# Patient Record
Sex: Female | Born: 1990 | Race: Black or African American | Hispanic: No | Marital: Married | Smoking: Never smoker
Health system: Southern US, Community
[De-identification: ages and names within clinical notes are randomized; demographics above are authoritative.]

## PROBLEM LIST (undated history)

## (undated) ENCOUNTER — Inpatient Hospital Stay (HOSPITAL_COMMUNITY): Payer: Self-pay

## (undated) DIAGNOSIS — R519 Headache, unspecified: Secondary | ICD-10-CM

## (undated) DIAGNOSIS — O009 Unspecified ectopic pregnancy without intrauterine pregnancy: Secondary | ICD-10-CM

## (undated) DIAGNOSIS — Z8619 Personal history of other infectious and parasitic diseases: Secondary | ICD-10-CM

## (undated) DIAGNOSIS — R51 Headache: Secondary | ICD-10-CM

## (undated) HISTORY — PX: DILATION AND CURETTAGE OF UTERUS: SHX78

## (undated) HISTORY — DX: Personal history of other infectious and parasitic diseases: Z86.19

## (undated) HISTORY — PX: TONSILLECTOMY: SUR1361

## (undated) HISTORY — PX: FOOT SURGERY: SHX648

---
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
## 2008-09-01 ENCOUNTER — Emergency Department (HOSPITAL_COMMUNITY): Admission: EM | Admit: 2008-09-01 | Discharge: 2008-09-02 | Payer: Self-pay | Admitting: Emergency Medicine

## 2009-07-01 ENCOUNTER — Encounter: Admission: RE | Admit: 2009-07-01 | Discharge: 2009-07-01 | Payer: Self-pay | Admitting: Family Medicine

## 2010-10-01 ENCOUNTER — Encounter: Payer: Self-pay | Admitting: Family Medicine

## 2011-01-23 NOTE — Op Note (Signed)
NAMETOI, STELLY               ACCOUNT NO.:  [id number]   MEDICAL RECORD NO.:  [id number]          PATIENT TYPE:  AMB   LOCATION:  DSC                          FACILITY:  [facility]   PHYSICIAN:  Christopher E. Ezzard Standing, M.D.DATE OF BIRTH:  [birth year]   DATE OF PROCEDURE:  03/04/2007  DATE OF DISCHARGE:                               OPERATIVE REPORT   PREOPERATIVE DIAGNOSIS:  Recurrent tonsillitis with tonsillar  hypertrophy.   POSTOPERATIVE DIAGNOSIS:  Recurrent tonsillitis with tonsillar  hypertrophy.   OPERATION:  Tonsillectomy.   SURGEON:  Kristine Garbe. Ezzard Standing, M.D.   ANESTHESIA:  General endotracheal.   COMPLICATIONS:  None.   BRIEF CLINICAL NOTE:  Rebecca Benjamin is a 20 year old female who has had  several episodes of Strep during the past years.  On examination, she  has large 3+ tonsils bilaterally with some white debris within the  tonsil crypts.  Because of her history of recurrent Strep and tonsillar  hypertrophy, she is taken to the operating room at this time for  tonsillectomy.   DESCRIPTION OF PROCEDURE:  After adequate endotracheal anesthesia, Rebecca Benjamin  received 10 mg Decadron IV preoperatively as well as 1 gram Ancef IV  preoperatively.  A mouth gag was used to expose the oropharynx.  The  left and right tonsils were then resected from the tonsillar fossa using  cautery.  Care was taken to preserve the anterior present tonsillar  pillars as well as the uvula.  Hemostasis was obtained with cautery.  After completion of removal of the tonsils, the oropharynx was irrigated  with saline.  The nasopharynx was then examined with a mirror.  Rebecca Benjamin  had minimal adenoid tissue.  Nothing was done in this region.  This  completed the procedure.  Rebecca Benjamin was awoken from anesthesia and  transferred to the recovery room postop doing well.   DISPOSITION:  Rebecca Benjamin will be discharged home later this morning on  amoxicillin suspension 500 mg b.i.d. for 1 week, Tylenol and Lortab  elixir 1 to 1-1/2 tablespoons every 4 hours p.r.n. pain.  She will  follow up in my office in 10 to 14 days for recheck.           ______________________________  Kristine Garbe. Ezzard Standing, M.D.     CEN/MEDQ  D:  03/04/2007  T:  03/04/2007  Job:  [id number]   cc:   Renaye Rakers, M.D.

## 2011-06-15 LAB — URINE MICROSCOPIC-ADD ON

## 2011-06-15 LAB — COMPREHENSIVE METABOLIC PANEL
ALT: 19 U/L (ref 0–35)
AST: 23 U/L (ref 0–37)
Albumin: 4.8 g/dL (ref 3.5–5.2)
Alkaline Phosphatase: 62 U/L (ref 39–117)
BUN: 16 mg/dL (ref 6–23)
CO2: 24 mEq/L (ref 19–32)
Calcium: 10 mg/dL (ref 8.4–10.5)
Chloride: 104 mEq/L (ref 96–112)
Creatinine, Ser: 0.77 mg/dL (ref 0.4–1.2)
GFR calc Af Amer: >60 mL/min (ref >60)
GFR calc non Af Amer: >60 mL/min (ref >60)
Glucose, Bld: 108 mg/dL — ABNORMAL HIGH (ref 70–99)
Potassium: 3.4 mEq/L — ABNORMAL LOW (ref 3.5–5.1)
Sodium: 140 mEq/L (ref 135–145)
Total Bilirubin: 1.3 mg/dL — ABNORMAL HIGH (ref 0.3–1.2)
Total Protein: 8.2 g/dL (ref 6.0–8.3)

## 2011-06-15 LAB — DIFFERENTIAL
Basophils Absolute: 0 10*3/uL (ref 0.0–0.1)
Basophils Relative: 0 % (ref 0–1)
Neutro Abs: 10.6 10*3/uL — ABNORMAL HIGH (ref 1.7–7.7)
Neutrophils Relative %: 96 % — ABNORMAL HIGH (ref 43–77)

## 2011-06-15 LAB — URINALYSIS, ROUTINE W REFLEX MICROSCOPIC
Leukocytes, UA: NEGATIVE
Protein, ur: 30 mg/dL — AB
Urobilinogen, UA: 0.2 mg/dL (ref 0.0–1.0)

## 2011-06-15 LAB — CBC
HCT: 43.7 % (ref 36.0–46.0)
Hemoglobin: 14.8 g/dL (ref 12.0–15.0)
MCHC: 33.9 g/dL (ref 30.0–36.0)
MCV: 95.9 fL (ref 78.0–100.0)
Platelets: 230 10*3/uL (ref 150–400)
RBC: 4.55 MIL/uL (ref 3.87–5.11)
RDW: 12.5 % (ref 11.5–15.5)
WBC: 11 10*3/uL — ABNORMAL HIGH (ref 4.0–10.5)

## 2011-06-15 LAB — POCT PREGNANCY, URINE: Preg Test, Ur: NEGATIVE

## 2011-11-06 ENCOUNTER — Other Ambulatory Visit: Payer: Self-pay

## 2011-11-06 ENCOUNTER — Emergency Department (HOSPITAL_COMMUNITY)
Admission: EM | Admit: 2011-11-06 | Discharge: 2011-11-07 | Disposition: A | Discharge status: Home / Self Care | Payer: 59 | Attending: Emergency Medicine | Admitting: Emergency Medicine

## 2011-11-06 ENCOUNTER — Encounter (HOSPITAL_COMMUNITY): Payer: Self-pay | Admitting: Emergency Medicine

## 2011-11-06 DIAGNOSIS — R1032 Left lower quadrant pain: Secondary | ICD-10-CM | POA: Insufficient documentation

## 2011-11-06 DIAGNOSIS — Z7982 Long term (current) use of aspirin: Secondary | ICD-10-CM | POA: Insufficient documentation

## 2011-11-06 DIAGNOSIS — R1031 Right lower quadrant pain: Secondary | ICD-10-CM | POA: Insufficient documentation

## 2011-11-06 DIAGNOSIS — R079 Chest pain, unspecified: Secondary | ICD-10-CM | POA: Insufficient documentation

## 2011-11-06 DIAGNOSIS — R070 Pain in throat: Secondary | ICD-10-CM | POA: Insufficient documentation

## 2011-11-06 LAB — CBC
HCT: 35.9 % — ABNORMAL LOW (ref 36.0–46.0)
Hemoglobin: 12.1 g/dL (ref 12.0–15.0)
MCH: 29.8 pg (ref 26.0–34.0)
MCHC: 33.7 g/dL (ref 30.0–36.0)
MCV: 88.4 fL (ref 78.0–100.0)
Platelets: 256 K/uL (ref 150–400)
RBC: 4.06 MIL/uL (ref 3.87–5.11)
RDW: 13.3 % (ref 11.5–15.5)
WBC: 6.6 K/uL (ref 4.0–10.5)

## 2011-11-06 LAB — BASIC METABOLIC PANEL
CO2: 25 mEq/L (ref 19–32)
Chloride: 105 mEq/L (ref 96–112)
Creatinine, Ser: 0.57 mg/dL (ref 0.50–1.10)

## 2011-11-06 NOTE — ED Notes (Signed)
Dr.Opitz to eval ecg at 23:20

## 2011-11-06 NOTE — ED Notes (Signed)
Pt c/o left chest pain radiating into mid chest onset approx 3pm today.   St's chest feels heavy.  Denies shortness of breath, nausea or diaphoresis

## 2011-11-07 ENCOUNTER — Emergency Department (HOSPITAL_COMMUNITY): Payer: 59

## 2011-11-07 LAB — HEPATIC FUNCTION PANEL
ALT: 7 U/L (ref 0–35)
AST: 14 U/L (ref 0–37)
Alkaline Phosphatase: 47 U/L (ref 39–117)
Total Protein: 7.4 g/dL (ref 6.0–8.3)

## 2011-11-07 MED ORDER — GI COCKTAIL ~~LOC~~
30.0000 mL | Freq: Once | ORAL | Status: AC
  Administered 2011-11-07: 30 mL via ORAL
  Filled 2011-11-07: qty 30

## 2011-11-07 MED ORDER — FAMOTIDINE 20 MG PO TABS: 20.0000 mg | ORAL_TABLET | Freq: Two times a day (BID) | ORAL | Status: DC

## 2011-11-07 NOTE — ED Provider Notes (Signed)
History     CSN: [id number]  Arrival date & time 11/06/11  2158   First MD Initiated Contact with Patient 11/07/11 0144      Chief Complaint  Patient presents with  . Chest Pain    (Consider location/radiation/quality/duration/timing/severity/associated sxs/prior treatment) HPI Comments: About noon today.  The patient developed left lower rib pain, radiating to the mid sternal area to her throat now with radiating back down at the sternal area to under the right rib.  She also has posterior neck pain.  No nausea, vomiting, diarrhea, or diaphoresis  The history is provided by the patient.    History reviewed. No pertinent past medical history.  Past Surgical History  Procedure Date  . Tonsillectomy     No family history on file.  History  Substance Use Topics  . Smoking status: Never Smoker   . Smokeless tobacco: Not on file  . Alcohol Use: Yes    OB History    Grav Para Term Preterm Abortions TAB SAB Ect Mult Living                  Review of Systems  Constitutional: Negative for fever and chills.  HENT: Positive for sore throat. Negative for ear pain and trouble swallowing.   Cardiovascular: Positive for chest pain. Negative for palpitations and leg swelling.  Gastrointestinal: Negative for nausea and vomiting.  Genitourinary: Negative for dysuria.  Neurological: Negative for dizziness and weakness.    Allergies  Review of patient's allergies indicates no known allergies.  Home Medications   Current Outpatient Rx  Name Route Sig Dispense Refill  . ASPIRIN EC 325 MG PO TBEC Oral Take 325 mg by mouth once.    Marland Kitchen FAMOTIDINE 20 MG PO TABS Oral Take 1 tablet (20 mg total) by mouth 2 (two) times daily. 30 tablet 0    BP 116/72  Pulse 79  Temp(Src) 98.5 F (36.9 C) (Oral)  Resp 16  SpO2 97%  LMP 10/18/2011  Physical Exam  Constitutional: She is oriented to person, place, and time. She appears well-developed and well-nourished.  Neck: Normal range of  motion.  Pulmonary/Chest: She has no wheezes. She exhibits no tenderness.  Abdominal: Soft. Bowel sounds are normal.    Musculoskeletal: Normal range of motion.  Neurological: She is alert and oriented to person, place, and time.  Skin: Skin is warm and dry.  Psychiatric: She has a normal mood and affect.    ED Course  Procedures (including critical care time)  Labs Reviewed  CBC - Abnormal; Notable for the following:    HCT 35.9 (*)    All other components within normal limits  HEPATIC FUNCTION PANEL - Abnormal; Notable for the following:    Total Bilirubin 0.2 (*)    All other components within normal limits  BASIC METABOLIC PANEL  LIPASE, BLOOD   Dg Chest 2 View  11/07/2011  *RADIOLOGY REPORT*  Clinical Data: Mid chest pain and bilateral lower chest pain.  CHEST - 2 VIEW  Comparison: None.  Findings: The lungs are well-aerated and clear.  There is no evidence of focal opacification, pleural effusion or pneumothorax.  The heart is normal in size; the mediastinal contour is within normal limits.  No acute osseous abnormalities are seen.  IMPRESSION: No acute cardiopulmonary process seen.  Original Report Authenticated By: Tonia Ghent, M.D.     1. Abdominal wall pain in both lower quadrants       MDM  Bilateral upper quadrant abdominal pain.  Mother has history of gallbladder disease with a cholecystectomy.  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  She did take an aspirin prior to arrival in the emergency Discomfort, significantly reduced after GI cocktail was sent home with short course of a PPI and followup with GI as needed       Arman Filter, NP 11/07/11 0339  Arman Filter, NP 11/07/11 [phone]  Medical screening examination/treatment/procedure(s) were performed by non-physician practitioner and as supervising physician I was immediately available for consultation/collaboration.   Sunnie Nielsen, MD 11/08/11 [phone]

## 2011-11-07 NOTE — Discharge Instructions (Signed)
Today her labs are normal.  Your hepatic function is normal.  Your pain was relieved with a GI cocktail is given you a prescription for short course of Pepcid take it as directed twice a day if your pain returns after using the Pepcid and use over-the-counter once daily.  I would recommend following up with lower GI for further diagnosis

## 2012-02-20 ENCOUNTER — Ambulatory Visit (INDEPENDENT_AMBULATORY_CARE_PROVIDER_SITE_OTHER): Payer: 59 | Admitting: Family Medicine

## 2012-02-20 VITALS — BP 103/68 | HR 88 | Temp 98.4°F | Resp 16 | Ht 66.0 in | Wt 127.0 lb

## 2012-02-20 DIAGNOSIS — B009 Herpesviral infection, unspecified: Secondary | ICD-10-CM

## 2012-02-20 MED ORDER — VALACYCLOVIR HCL 500 MG PO TABS: 500.0000 mg | ORAL_TABLET | Freq: Two times a day (BID) | ORAL | Status: AC

## 2012-02-20 MED ORDER — PREDNISONE 20 MG PO TABS: 40.0000 mg | ORAL_TABLET | Freq: Every day | ORAL | Status: AC

## 2012-02-20 MED ORDER — ACYCLOVIR 400 MG PO TABS: 400.0000 mg | ORAL_TABLET | ORAL | Status: AC

## 2012-02-20 NOTE — Progress Notes (Signed)
21 year old woman comes in with her mother today with swollen upper lip. She does recurrence of this problem for many years. She has a sore inside her mouth as well.  Family believes this is angioedema because grandfather also had this problem.  Objective: Swollen upper left lip with inner vesicular a ruptured and at the vermilion border. Ascitic ENT is negative.  Assessment: Recurrent HSV-1  Plan: Valtrex 500 twice a day x3 days then acyclovir 400 daily times a year, prednisone 40 mg daily x3

## 2012-02-20 NOTE — Patient Instructions (Signed)
Cold Sores (Herpes Simplex Virus) The herpes simplex virus causes fever blisters or cold sores. Fever blisters are small sores on the lips, gums or roof of the mouth. People commonly get infected with this herpes virus but do not have any symptoms. The blisters may break out when a person:  Is especially tired.   Has a fever.   Is menstruating (in women - having a monthly period).   Under emotional stress.   Has another infection (such as a cold).   Is exposed to sunlight.  Pain, tingling, itch, burning may occur before the blisters in the affected area. The blisters usually heal within a week. The virus can be easily passed to other people and to other parts of the body such as the eyes and sex organs. HOME CARE INSTRUCTIONS    Only take over-the-counter or prescription medicines for pain, discomfort, or fever as directed by your caregiver. Do not use aspirin.   Do not touch the blisters or pick the scabs. Wash your hands often and do not touch your eyes without washing your hands first.   To help reduce discomfort, apply an ice cube or ice pack to your lip for 30 minutes or suck on frozen juice bars.   Use a sun screen on your lips when you go outdoors.   This infection is contagious. Avoid close contact with other people, especially kissing or oral sex, until blisters heal. The virus that causes cold sores usually is different than the one that causes sores on the genitals. However, cold sores may occur in persons who have oral sex with a partner who has genital herpes.   Hot, cold, or salty foods may hurt your mouth. Use a straw to drink. Eating a well-balanced diet will help healing.  SEEK IMMEDIATE MEDICAL CARE IF:    Your eye feels irritated, painful, or you feel like you have something in your eye.   You develop a fever, feel achy, or see pus instead of clear fluid in the sores. These are signs of a bacterial (germ) infection. You can apply over-the-counter neomycin ointment.     You get blisters on your genitals.   You develop new unexplained symptoms.  MAKE SURE YOU:    Understand these instructions.   Will watch your condition.   Will get help right away if you are not doing well or get worse.  Document Released: 08/24/2000 Document Revised: 08/16/2011 Document Reviewed: 02/24/2009 Exit[employer] Patient Information 2012 [employer]. 

## 2013-07-04 ENCOUNTER — Emergency Department (HOSPITAL_COMMUNITY)
Admission: EM | Admit: 2013-07-04 | Discharge: 2013-07-04 | Disposition: A | Discharge status: Home / Self Care | Payer: 59 | Source: Home / Self Care | Attending: Emergency Medicine | Admitting: Emergency Medicine

## 2013-07-04 ENCOUNTER — Encounter (HOSPITAL_COMMUNITY): Payer: Self-pay | Admitting: Emergency Medicine

## 2013-07-04 DIAGNOSIS — G43909 Migraine, unspecified, not intractable, without status migrainosus: Secondary | ICD-10-CM

## 2013-07-04 MED ORDER — SUMATRIPTAN SUCCINATE 6 MG/0.5ML ~~LOC~~ SOLN
SUBCUTANEOUS | Status: AC
  Filled 2013-07-04: qty 0.5

## 2013-07-04 MED ORDER — KETOROLAC TROMETHAMINE 60 MG/2ML IM SOLN
30.0000 mg | Freq: Once | INTRAMUSCULAR | Status: AC
  Administered 2013-07-04: 30 mg via INTRAMUSCULAR

## 2013-07-04 MED ORDER — KETOROLAC TROMETHAMINE 30 MG/ML IJ SOLN
INTRAMUSCULAR | Status: AC
  Filled 2013-07-04: qty 1

## 2013-07-04 MED ORDER — ISOMETHEPTENE-APAP-DICHLORAL 65-325-100 MG PO CAPS: 1.0000 | ORAL_CAPSULE | Freq: Four times a day (QID) | ORAL | Status: DC | PRN

## 2013-07-04 MED ORDER — ZOLPIDEM TARTRATE 5 MG PO TABS: 5.0000 mg | ORAL_TABLET | Freq: Every evening | ORAL | Status: DC | PRN

## 2013-07-04 MED ORDER — SUMATRIPTAN SUCCINATE 6 MG/0.5ML ~~LOC~~ SOLN
6.0000 mg | Freq: Once | SUBCUTANEOUS | Status: AC
  Administered 2013-07-04: 6 mg via SUBCUTANEOUS

## 2013-07-04 MED ORDER — METOCLOPRAMIDE HCL 5 MG/ML IJ SOLN
INTRAMUSCULAR | Status: AC
  Filled 2013-07-04: qty 2

## 2013-07-04 MED ORDER — SUMATRIPTAN SUCCINATE 50 MG PO TABS: 50.0000 mg | ORAL_TABLET | ORAL | Status: DC | PRN

## 2013-07-04 MED ORDER — METOCLOPRAMIDE HCL 5 MG/ML IJ SOLN
10.0000 mg | Freq: Once | INTRAMUSCULAR | Status: AC
  Administered 2013-07-04: 10 mg via INTRAMUSCULAR

## 2013-07-04 NOTE — ED Provider Notes (Signed)
Chief Complaint:   Chief Complaint  Patient presents with  . Headache    History of Present Illness:   Rebecca Benjamin is a 22 year old female who has had a three-day history of a throbbing headache rated 9/10 at the worst and now is 7/10. The headache is sometimes on the right, then moved to the left, then back to the right. Sometimes it's on both sides. This came on with her menses. She may also have a viral illness since she had some nausea and diarrhea. She denies any URI symptoms or fever, although she did feel hot and cold yesterday. She's had nausea but no vomiting, photophobia, and phonophobia. The pain is worse with activity or movement and better with rest. Her neck has felt a little bit tight. She denies any diplopia or blurry vision. She's had no visual auras. She denies any nasal congestion, rhinorrhea, or sore throat. She has not had any neurological symptoms such as numbness, tingling, weakness, difficulty with speech, swallowing, ambulation, equilibrium, balance, or coordination. She has had migraines for years. She usually manages these herself with Goody powders or BCs.  Review of Systems:  Other than noted above, the patient denies any of the following symptoms: Systemic:  No fever, chills, fatigue, photophobia, stiff neck. Eye:  No redness, eye pain, discharge, blurred vision, or diplopia. ENT:  No nasal congestion, rhinorrhea, sinus pressure or pain, sneezing, earache, or sore throat.  No jaw claudication. Neuro:  No paresthesias, loss of consciousness, seizure activity, muscle weakness, trouble with coordination or gait, trouble speaking or swallowing. Psych:  No depression, anxiety or trouble sleeping.  PMFSH:  Past medical history, family history, social history, meds, and allergies were reviewed.  She has an IUD. It  Physical Exam:   Vital signs:  BP 146/97  Pulse 92  Temp(Src) 98.2 F (36.8 C) (Oral)  Resp 14  SpO2 99% General:  Alert and oriented.  In no  distress. Eye:  Lids and conjunctivas normal.  PERRL,  Full EOMs.  Fundi benign with normal discs and vessels. ENT:  No cranial or facial tenderness to palpation.  TMs and canals clear.  Nasal mucosa was normal and uncongested without any drainage. No intra oral lesions, pharynx clear, mucous membranes moist, dentition normal. Neck:  Supple, full ROM, no tenderness to palpation.  No adenopathy or mass. Neuro:  Alert and orented times 3.  Speech was clear, fluent, and appropriate.  Cranial nerves intact. No pronator drift, muscle strength normal. Finger to nose normal.  DTRs were 2+ and symmetrical.Station and gait were normal.  Romberg's sign was normal.  Able to perform tandem gait well. Psych:  Normal affect.  Course in Urgent Care Center:   She was given Imitrex 6 mg subcutaneous, Reglan 10 mg IM, and Toradol 30 mg IM. The Toradol and Reglan did not bother her at all, but after getting the Imitrex she felt some facial flushing, burning, and rhinorrhea within 2-3 minutes of the medication. She was observed and this went away after about 15 minutes. A notation was made in her chart that she is intolerant to Imitrex.  Assessment:  The encounter diagnosis was Migraine headache.  Probably triggered by menses and possibly by a viral syndrome. She appears to be intolerant to Imitrex.  Plan:   1.  Meds:  The following meds were prescribed:   Discharge Medication List as of 07/04/2013  4:55 PM    START taking these medications   Details  isometheptene-acetaminophen-dichloralphenazone (MIDRIN) 65-325-100 MG capsule Take  1 capsule by mouth 4 (four) times daily as needed. Maximum 5 capsules in 12 hours for migraine headaches, 8 capsules in 24 hours for tension headaches., Starting 07/04/2013, Until Discontinued, Print    zolpidem (AMBIEN) 5 MG tablet Take 1 tablet (5 mg total) by mouth at bedtime as needed for sleep., Starting 07/04/2013, Until Discontinued, Print        2.  Patient  Education/Counseling:  The patient was given appropriate handouts, self care instructions, and instructed in symptomatic relief.    3.  Follow up:  The patient was told to follow up if no better in 3 to 4 days, if becoming worse in any way, and given some red flag symptoms such as fever, new neurological symptoms, or worsening of the headache which would prompt immediate return.  Follow up here as necessary, and she'll also need to followup with primary care for chronic migraine management.     Reuben Likes, MD 07/04/13 2200

## 2013-07-04 NOTE — ED Notes (Signed)
Pt reported generalized burning , nose running within 2-3 min of medication; Dr Lorenz Coaster aware

## 2013-07-04 NOTE — ED Notes (Signed)
C/o HA for past few days, minimal relief w OTC medication. HA in temporal area w photophobia, nausea, sensitivity to loud noises; NAD at present

## 2013-08-17 ENCOUNTER — Emergency Department (INDEPENDENT_AMBULATORY_CARE_PROVIDER_SITE_OTHER)
Admission: EM | Admit: 2013-08-17 | Discharge: 2013-08-17 | Disposition: A | Discharge status: Home / Self Care | Payer: 59 | Source: Home / Self Care | Attending: Emergency Medicine | Admitting: Emergency Medicine

## 2013-08-17 ENCOUNTER — Encounter (HOSPITAL_COMMUNITY): Payer: Self-pay | Admitting: Emergency Medicine

## 2013-08-17 ENCOUNTER — Emergency Department (INDEPENDENT_AMBULATORY_CARE_PROVIDER_SITE_OTHER): Payer: 59

## 2013-08-17 DIAGNOSIS — B029 Zoster without complications: Secondary | ICD-10-CM

## 2013-08-17 MED ORDER — HYDROCODONE-ACETAMINOPHEN 5-325 MG PO TABS: 1.0000 | ORAL_TABLET | Freq: Three times a day (TID) | ORAL | Status: DC | PRN

## 2013-08-17 MED ORDER — VALACYCLOVIR HCL 1 G PO TABS: 1000.0000 mg | ORAL_TABLET | Freq: Three times a day (TID) | ORAL | Status: AC

## 2013-08-17 MED ORDER — CEPHALEXIN 500 MG PO CAPS: 500.0000 mg | ORAL_CAPSULE | Freq: Two times a day (BID) | ORAL | Status: DC

## 2013-08-17 NOTE — ED Provider Notes (Addendum)
Rebecca Benjamin is a 22 y.o. female who presents to Urgent Care today for facial swelling.  Pt reports 3AM she started having lip swelling and at 6AM developed right face and right eye swelling. She used benadryl and a cold pack which helped some with swelling. Has also reports tightness at right chest, breast, and around to center of back, worse with deep breathing. Reports mild dyspnea walking up and down stairs. Reports tenderness at right jaw angle. Denies tooth pain, recent trauma to mouth or chest or back, or purulence from mouth or ears. Reports some coughing this morning. Reports chills. Denies rash. Denies new exposures to food, oral products, or medications. Reports increased stress at work lately and that she has had cold sores on lips at corners of mouth in the past.    History reviewed. No pertinent past medical history. History  Substance Use Topics  . Smoking status: Never Smoker   . Smokeless tobacco: Not on file  . Alcohol Use: Yes   ROS as above Medications reviewed.  SH: In [employer], came back last night and totally fine. Reports much increased work related stress. Reports 1 monogamous sexual partner x 3 years Reports recent STD screen negative  No current facility-administered medications for this encounter.   Current Outpatient Prescriptions  Medication Sig Dispense Refill  . aspirin EC 325 MG tablet Take 325 mg by mouth once.      . cephALEXin (KEFLEX) 500 MG capsule Take 1 capsule (500 mg total) by mouth 2 (two) times daily.  14 capsule  0  . famotidine (PEPCID) 20 MG tablet Take 1 tablet (20 mg total) by mouth 2 (two) times daily.  30 tablet  0  . HYDROcodone-acetaminophen (NORCO/VICODIN) 5-325 MG per tablet Take 1 tablet by mouth every 8 (eight) hours as needed for moderate pain.  10 tablet  0  . isometheptene-acetaminophen-dichloralphenazone (MIDRIN) 65-325-100 MG capsule Take 1 capsule by mouth 4 (four) times daily as needed. Maximum 5 capsules in 12 hours for  migraine headaches, 8 capsules in 24 hours for tension headaches.  30 capsule  0  . valACYclovir (VALTREX) 1000 MG tablet Take 1 tablet (1,000 mg total) by mouth 3 (three) times daily.  21 tablet  0  . zolpidem (AMBIEN) 5 MG tablet Take 1 tablet (5 mg total) by mouth at bedtime as needed for sleep.  7 tablet  0    Exam:  BP 109/53  Pulse 84  Temp(Src) 98.4 F (36.9 C) (Oral)  Resp 18  SpO2 100%  LMP 07/27/2013 Gen: Well NAD HEENT: EOMI,  MMM, right lower lip vesicular grouped lesions on inner mucosal surface and mild swelling and erythema, o/p clear, sclera clear, mild right submandibular fullness, no obvious swelling or LAD Lungs: Normal work of breathing. CTABL Heart: RRR no MRG Exts: Warm and well perfused.  MSK: Tenderness right chest wall, back and angle of right jaw. Skin: No rash or cyanosis other than grouped vesicles right lower lip. Unroofed with clear fluid within. Neck supple with full ROM  No results found for this or any previous visit (from the past 24 hour(s)). Dg Chest 2 View  08/17/2013   CLINICAL DATA:  Right facial swelling  EXAM: CHEST  2 VIEW  COMPARISON:  11/07/2011  FINDINGS: The heart size and mediastinal contours are within normal limits. Both lungs are clear. The visualized skeletal structures are unremarkable.  IMPRESSION: No active cardiopulmonary disease.   Electronically Signed   By: Natasha Mead M.D.   On:  08/17/2013 10:06    Assessment and Plan: 22 y.o. female with facial swelling. Unlikely to be dental due to no dental erythema, tenderness, or purulence. Oral vesicles appear to be HSV/VZV. Pt reports h/o positive oral HSV. Current symptoms possibly brought on by stress. Otherwise, recent STD screen negative. Unlikely anaphylaxis given no new exposures and no other rash. However, if swelling returns and pt becomes dyspneic, would consider allergic reaction. Currently appears inflammatory. Possible shingles vs oral herpes flare. - Will culture vesicles for  HSV/herpes zoster. - With pleuritic chest pain, CXR and d-dimer ordered. CXR with no active cardiopulmonary disease. Ddimer neg. - Will not repeat HIV testing due to pt report of recent testing. However, encourage regular testing. - Will dose valtrex (1g TID x 7d), keflex to cover for possible bacterial infection, and recommend NSAID for pain control and dose hydrocodone #10 for pain. - F/u with ENT in 1-2 days.  Leona Singleton, MD    Leona Singleton, MD 08/17/13 1137   Addendum 08/20/2013 - Called to let patient know of HSV1 in results and that valtrex prescribed at 12/8 visit is the correct treatment for this. Pt voiced understanding. Leona Singleton, MD   Leona Singleton, MD 08/20/13 1225

## 2013-08-17 NOTE — ED Provider Notes (Signed)
Medical screening examination/treatment/procedure(s) were performed by a resident physician and as supervising physician I was immediately available for consultation/collaboration.  Additionally, I saw the patient independently, verified the history, examined the patient and discussed the treatment plan with the resident.  The patient presents with oral ulcers and pleuritic chest pain. Chest x-ray and d-dimer were negative. I'm not sure what the cause of this is. Will cover with antivirals, antibiotics, pain meds, anti-inflammatories. Followup with ENT in 2 days.  Leslee Home, M.D.   Reuben Likes, MD 08/17/13 [phone]

## 2013-08-17 NOTE — ED Notes (Signed)
C/o lips swelling and back tightness that radiates States lips swelling and she thinks the cause is stress Inside of lips is red and has blisters on them  States her right side of back radiating to the middle of her chest

## 2013-08-19 LAB — WOUND CULTURE

## 2013-08-19 LAB — HERPES SIMPLEX VIRUS CULTURE: Culture: DETECTED

## 2013-08-20 ENCOUNTER — Telehealth (HOSPITAL_COMMUNITY): Payer: Self-pay | Admitting: *Deleted

## 2013-08-20 NOTE — ED Provider Notes (Signed)
Medical screening examination/treatment/procedure(s) were performed by a resident physician and as supervising physician I was immediately available for consultation/collaboration.  Additionally, I saw the patient independently, verified the history, examined the patient and discussed the treatment plan with the resident.  Leslee Home, M.D.    Reuben Likes, MD 08/20/13 [phone]

## 2013-08-20 NOTE — ED Notes (Addendum)
Urine culture: Multiple organisms present, none predominant. Herpes culture mouth: Herpes Simplex Type 1.  Pt. adequately treated with Valtrex. I called pt., but mailbox was full -unable to leave a message.  Call 1. Cherly Anderson M 08/20/2013 I called pt. and left a message to call. Call 2. Pt. called back and said the doctor had called her the result.  She said she already knew she had it. 08/21/2013

## 2014-09-06 ENCOUNTER — Encounter (HOSPITAL_COMMUNITY): Payer: Self-pay | Admitting: *Deleted

## 2014-09-06 ENCOUNTER — Inpatient Hospital Stay (HOSPITAL_COMMUNITY)
Admission: AD | Admit: 2014-09-06 | Discharge: 2014-09-06 | Disposition: A | Discharge status: Home / Self Care | Payer: 59 | Source: Ambulatory Visit | Attending: Family Medicine | Admitting: Family Medicine

## 2014-09-06 ENCOUNTER — Inpatient Hospital Stay (HOSPITAL_COMMUNITY): Payer: 59

## 2014-09-06 DIAGNOSIS — N939 Abnormal uterine and vaginal bleeding, unspecified: Secondary | ICD-10-CM | POA: Diagnosis not present

## 2014-09-06 DIAGNOSIS — N949 Unspecified condition associated with female genital organs and menstrual cycle: Secondary | ICD-10-CM | POA: Insufficient documentation

## 2014-09-06 DIAGNOSIS — R1031 Right lower quadrant pain: Secondary | ICD-10-CM | POA: Diagnosis not present

## 2014-09-06 DIAGNOSIS — R102 Pelvic and perineal pain: Secondary | ICD-10-CM

## 2014-09-06 LAB — URINE MICROSCOPIC-ADD ON

## 2014-09-06 LAB — CBC
HCT: 37.8 % (ref 36.0–46.0)
Hemoglobin: 12.8 g/dL (ref 12.0–15.0)
MCH: 31.8 pg (ref 26.0–34.0)
MCHC: 33.9 g/dL (ref 30.0–36.0)
MCV: 94 fL (ref 78.0–100.0)
Platelets: 228 10*3/uL (ref 150–400)
RBC: 4.02 MIL/uL (ref 3.87–5.11)
RDW: 12.6 % (ref 11.5–15.5)
WBC: 7.6 10*3/uL (ref 4.0–10.5)

## 2014-09-06 LAB — URINALYSIS, ROUTINE W REFLEX MICROSCOPIC
Bilirubin Urine: NEGATIVE
Glucose, UA: NEGATIVE mg/dL
Ketones, ur: NEGATIVE mg/dL
Leukocytes, UA: NEGATIVE
Nitrite: NEGATIVE
Protein, ur: NEGATIVE mg/dL
Specific Gravity, Urine: 1.015 (ref 1.005–1.030)
Urobilinogen, UA: 0.2 mg/dL (ref 0.0–1.0)
pH: 7.5 (ref 5.0–8.0)

## 2014-09-06 LAB — POCT PREGNANCY, URINE: PREG TEST UR: NEGATIVE

## 2014-09-06 LAB — WET PREP, GENITAL
CLUE CELLS WET PREP: NONE SEEN
Trich, Wet Prep: NONE SEEN
Yeast Wet Prep HPF POC: NONE SEEN

## 2014-09-06 MED ORDER — KETOROLAC TROMETHAMINE 60 MG/2ML IM SOLN
60.0000 mg | INTRAMUSCULAR | Status: AC
  Administered 2014-09-06: 60 mg via INTRAMUSCULAR
  Filled 2014-09-06: qty 2

## 2014-09-06 MED ORDER — MEGESTROL ACETATE 40 MG PO TABS: ORAL_TABLET | ORAL | Status: DC

## 2014-09-06 MED ORDER — IBUPROFEN 600 MG PO TABS: 600.0000 mg | ORAL_TABLET | Freq: Four times a day (QID) | ORAL | Status: DC | PRN

## 2014-09-06 MED ORDER — OXYCODONE-ACETAMINOPHEN 5-325 MG PO TABS: 1.0000 | ORAL_TABLET | Freq: Four times a day (QID) | ORAL | Status: DC | PRN

## 2014-09-06 NOTE — Discharge Instructions (Signed)
Pelvic Pain Female pelvic pain can be caused by many different things and start from a variety of places. Pelvic pain refers to pain that is located in the lower half of the abdomen and between your hips. The pain may occur over a short period of time (acute) or may be reoccurring (chronic). The cause of pelvic pain may be related to disorders affecting the female reproductive organs (gynecologic), but it may also be related to the bladder, kidney stones, an intestinal complication, or muscle or skeletal problems. Getting help right away for pelvic pain is important, especially if there has been severe, sharp, or a sudden onset of unusual pain. It is also important to get help right away because some types of pelvic pain can be life threatening.  CAUSES  Below are only some of the causes of pelvic pain. The causes of pelvic pain can be in one of several categories.   Gynecologic.  Pelvic inflammatory disease.  Sexually transmitted infection.  Ovarian cyst or a twisted ovarian ligament (ovarian torsion).  Uterine lining that grows outside the uterus (endometriosis).  Fibroids, cysts, or tumors.  Ovulation.  Pregnancy.  Pregnancy that occurs outside the uterus (ectopic pregnancy).  Miscarriage.  Labor.  Abruption of the placenta or ruptured uterus.  Infection.  Uterine infection (endometritis).  Bladder infection.  Diverticulitis.  Miscarriage related to a uterine infection (septic abortion).  Bladder.  Inflammation of the bladder (cystitis).  Kidney stone(s).  Gastrointestinal.  Constipation.  Diverticulitis.  Neurologic.  Trauma.  Feeling pelvic pain because of mental or emotional causes (psychosomatic).  Cancers of the bowel or pelvis. EVALUATION  Your caregiver will want to take a careful history of your concerns. This includes recent changes in your health, a careful gynecologic history of your periods (menses), and a sexual history. Obtaining your family  history and medical history is also important. Your caregiver may suggest a pelvic exam. A pelvic exam will help identify the location and severity of the pain. It also helps in the evaluation of which organ system may be involved. In order to identify the cause of the pelvic pain and be properly treated, your caregiver may order tests. These tests may include:   A pregnancy test.  Pelvic ultrasonography.  An X-ray exam of the abdomen.  A urinalysis or evaluation of vaginal discharge.  Blood tests. HOME CARE INSTRUCTIONS   Only take over-the-counter or prescription medicines for pain, discomfort, or fever as directed by your caregiver.   Rest as directed by your caregiver.   Eat a balanced diet.   Drink enough fluids to make your urine clear or pale yellow, or as directed.   Avoid sexual intercourse if it causes pain.   Apply warm or cold compresses to the lower abdomen depending on which one helps the pain.   Avoid stressful situations.   Keep a journal of your pelvic pain. Write down when it started, where the pain is located, and if there are things that seem to be associated with the pain, such as food or your menstrual cycle.  Follow up with your caregiver as directed.  SEEK MEDICAL CARE IF:  Your medicine does not help your pain.  You have abnormal vaginal discharge. SEEK IMMEDIATE MEDICAL CARE IF:   You have heavy bleeding from the vagina.   Your pelvic pain increases.   You feel light-headed or faint.   You have chills.   You have pain with urination or blood in your urine.   You have uncontrolled diarrhea   or vomiting.   You have a fever or persistent symptoms for more than 3 days.  You have a fever and your symptoms suddenly get worse.   You are being physically or sexually abused.  MAKE SURE YOU:  Understand these instructions.  Will watch your condition.  Will get help if you are not doing well or get worse. Document Released:  07/24/2004 Document Revised: 01/11/2014 Document Reviewed: 12/17/2011 [employer] Patient Information 2015 [employer]. This information is not intended to replace advice given to you by your health care provider. Make sure you discuss any questions you have with your health care provider.  

## 2014-09-06 NOTE — MAU Note (Addendum)
Had period 05/20/10 of this month; always the same, not on any birth control.  On Sat, she sat down on the toilet and had blood everywhere.  Is still bleeding. Sat and Sun, did not have pain; started having severe pain  In RLQ this morning at work- shoots up to navel, around to back.  Hits to sit straight and walk.  Feels super tired.  occ nausea for a couple months.

## 2014-09-06 NOTE — MAU Provider Note (Signed)
Chief Complaint: Vaginal Bleeding and Abdominal Pain   First Provider Initiated Contact with Patient 09/06/14 1555     SUBJECTIVE HPI: Rebecca Benjamin is a 23 y.o. G1P0010 who presents to maternity admissions reporting heavy vaginal bleeding since yesterday, increasing to soaking 1 pad every 2 hours.  She also reports RLQ sharp pain and lower back pain coming intermittently.  She usually has regular periods, lasting 3 days.  Patient's last menstrual period was 08/18/2014 (exact date).  This bleeding was unexpected and not like her usual lighter periods.  She also reports recent fatigue and nausea x 1-2 weeks.  She has hx of anemia several years ago.  She had an IUD until 1 year ago which she reports was removed in the office with some difficulty.  She is not currently using hormonal birth control.  She denies vaginal itching/burning, urinary symptoms, h/a, dizziness, n/v, or fever/chills.    Pt desires new Gyn provider in [place].  She is interested in [facility] office.   Past Medical History  Diagnosis Date  . Medical history non-contributory    Past Surgical History  Procedure Laterality Date  . Tonsillectomy    . Foot surgery Left 2014 and 2015  . Dilation and curettage of uterus     History   Social History  . Marital Status: Single    Spouse Name: N/A    Number of Children: N/A  . Years of Education: N/A   Occupational History  . Not on file.   Social History Main Topics  . Smoking status: Never Smoker   . Smokeless tobacco: Never Used  . Alcohol Use: Yes  . Drug Use: Yes    Special: Marijuana  . Sexual Activity: Yes    Birth Control/ Protection: None   Other Topics Concern  . Not on file   Social History Narrative   No current facility-administered medications on file prior to encounter.   Current Outpatient Prescriptions on File Prior to Encounter  Medication Sig Dispense Refill  . famotidine (PEPCID) 20 MG tablet Take 1 tablet (20 mg total) by mouth 2  (two) times daily. 30 tablet 0  . isometheptene-acetaminophen-dichloralphenazone (MIDRIN) 65-325-100 MG capsule Take 1 capsule by mouth 4 (four) times daily as needed. Maximum 5 capsules in 12 hours for migraine headaches, 8 capsules in 24 hours for tension headaches. (Patient taking differently: Take 1 capsule by mouth 4 (four) times daily as needed for migraine or headache. Maximum 5 capsules in 12 hours for migraine headaches, 8 capsules in 24 hours for tension headaches.) 30 capsule 0   Allergies  Allergen Reactions  . Amoxicillin Nausea And Vomiting  . Imitrex [Sumatriptan]     C/o felt generalized flushing, burning, rhinorrhea within 2-3 min of medication  . Strawberry Swelling    ROS: Pertinent items in HPI  OBJECTIVE Blood pressure 99/55, pulse 65, temperature 98.9 F (37.2 C), temperature source Oral, resp. rate 16, weight 61.236 kg (135 lb), last menstrual period 08/18/2014. GENERAL: Well-developed, well-nourished female in no acute distress.  HEENT: Normocephalic HEART: normal rate RESP: normal effort ABDOMEN: Soft, tenderness in lower RLQ, no rebound tenderness or guarding EXTREMITIES: Nontender, no edema NEURO: Alert and oriented Pelvic exam: Cervix pink, visually closed, without lesion, moderate amount dark red bleeding, vaginal walls and external genitalia normal Bimanual exam: Cervix 0/long/high, firm, anterior, neg CMT, uterus nontender, nonenlarged, right adnexal tenderness, none on left, no enlargement or mass bilaterally  LAB RESULTS Results for orders placed or performed during the hospital encounter  of 09/06/14 (from the past 24 hour(s))  Urinalysis, Routine w reflex microscopic     Status: Abnormal   Collection Time: 09/06/14  1:23 PM  Result Value Ref Range   Color, Urine YELLOW YELLOW   APPearance CLEAR CLEAR   Specific Gravity, Urine 1.015 1.005 - 1.030   pH 7.5 5.0 - 8.0   Glucose, UA NEGATIVE NEGATIVE mg/dL   Hgb urine dipstick LARGE (A) NEGATIVE    Bilirubin Urine NEGATIVE NEGATIVE   Ketones, ur NEGATIVE NEGATIVE mg/dL   Protein, ur NEGATIVE NEGATIVE mg/dL   Urobilinogen, UA 0.2 0.0 - 1.0 mg/dL   Nitrite NEGATIVE NEGATIVE   Leukocytes, UA NEGATIVE NEGATIVE  Urine microscopic-add on     Status: Abnormal   Collection Time: 09/06/14  1:23 PM  Result Value Ref Range   Squamous Epithelial / LPF FEW (A) RARE   RBC / HPF 21-50 <3 RBC/hpf   Bacteria, UA FEW (A) RARE   Urine-Other MUCOUS PRESENT   Pregnancy, urine POC     Status: None   Collection Time: 09/06/14  2:37 PM  Result Value Ref Range   Preg Test, Ur NEGATIVE NEGATIVE  CBC     Status: None   Collection Time: 09/06/14  3:55 PM  Result Value Ref Range   WBC 7.6 4.0 - 10.5 K/uL   RBC 4.02 3.87 - 5.11 MIL/uL   Hemoglobin 12.8 12.0 - 15.0 g/dL   HCT 37.8 36.0 - 46.0 %   MCV 94.0 78.0 - 100.0 fL   MCH 31.8 26.0 - 34.0 pg   MCHC 33.9 30.0 - 36.0 g/dL   RDW 12.6 11.5 - 15.5 %   Platelets 228 150 - 400 K/uL  Wet prep, genital     Status: Abnormal   Collection Time: 09/06/14  4:17 PM  Result Value Ref Range   Yeast Wet Prep HPF POC NONE SEEN NONE SEEN   Trich, Wet Prep NONE SEEN NONE SEEN   Clue Cells Wet Prep HPF POC NONE SEEN NONE SEEN   WBC, Wet Prep HPF POC FEW (A) NONE SEEN    IMAGING US Transvaginal Non-ob  09/06/2014   CLINICAL DATA:  Abnormal uterine bleeding since September 04, 2014, RIGHT lower quadrant pain.  EXAM: TRANSABDOMINAL AND TRANSVAGINAL ULTRASOUND OF PELVIS  TECHNIQUE: Both transabdominal and transvaginal ultrasound examinations of the pelvis were performed. Transabdominal technique was performed for global imaging of the pelvis including uterus, ovaries, adnexal regions, and pelvic cul-de-sac. It was necessary to proceed with endovaginal exam following the transabdominal exam to visualize the endometrium.  COMPARISON:  None  FINDINGS: Uterus  Measurements: 8.3 x 4.5 x 5.1 cm. No fibroids or other mass visualized.  Endometrium  Thickness: 4 mm.  No focal  abnormality visualized.  Right ovary  Measurements: 3.4 x 2.6 x 3.8 cm. Normal appearance/no adnexal mass.  Left ovary  Measurements: 3 x 3 x 2.7 cm. Normal appearance/no adnexal mass.  Other findings  No free fluid.  IMPRESSION: Normal pelvic ultrasound.   Electronically Signed   By: Elon Alas   On: 09/06/2014 17:17   US Pelvis Complete  09/06/2014   CLINICAL DATA:  Abnormal uterine bleeding since September 04, 2014, RIGHT lower quadrant pain.  EXAM: TRANSABDOMINAL AND TRANSVAGINAL ULTRASOUND OF PELVIS  TECHNIQUE: Both transabdominal and transvaginal ultrasound examinations of the pelvis were performed. Transabdominal technique was performed for global imaging of the pelvis including uterus, ovaries, adnexal regions, and pelvic cul-de-sac. It was necessary to proceed with endovaginal exam following the transabdominal  exam to visualize the endometrium.  COMPARISON:  None  FINDINGS: Uterus  Measurements: 8.3 x 4.5 x 5.1 cm. No fibroids or other mass visualized.  Endometrium  Thickness: 4 mm.  No focal abnormality visualized.  Right ovary  Measurements: 3.4 x 2.6 x 3.8 cm. Normal appearance/no adnexal mass.  Left ovary  Measurements: 3 x 3 x 2.7 cm. Normal appearance/no adnexal mass.  Other findings  No free fluid.  IMPRESSION: Normal pelvic ultrasound.   Electronically Signed   By: Elon Alas   On: 09/06/2014 17:17    ASSESSMENT 1. Abnormal uterine bleeding (AUB)   2. Adnexal pain   3. Right lower quadrant abdominal pain     PLAN Discharge home Ibuprofen 600 mg PO Q 6 hours Percocet 5/325, take 1-2 Q 6 hours PRN x 10 tablets Megace taper  F/U at [facility] in 2-4 weeks to discuss AUB and contraception Return to MAU if symptoms persist or worsen    Medication List    STOP taking these medications        cephALEXin 500 MG capsule  Commonly known as:  KEFLEX     HYDROcodone-acetaminophen 5-325 MG per tablet  Commonly known as:  NORCO/VICODIN     zolpidem 5 MG tablet   Commonly known as:  AMBIEN      TAKE these medications        famotidine 20 MG tablet  Commonly known as:  PEPCID  Take 1 tablet (20 mg total) by mouth 2 (two) times daily.     ibuprofen 600 MG tablet  Commonly known as:  ADVIL,MOTRIN  Take 1 tablet (600 mg total) by mouth every 6 (six) hours as needed.     isometheptene-acetaminophen-dichloralphenazone 65-325-100 MG capsule  Commonly known as:  MIDRIN  Take 1 capsule by mouth 4 (four) times daily as needed. Maximum 5 capsules in 12 hours for migraine headaches, 8 capsules in 24 hours for tension headaches.     megestrol 40 MG tablet  Commonly known as:  MEGACE  Take 3 tablets daily for 3 days, 2 tablets daily for 3 days, then 1 tablet daily until bleeding stops.     oxyCODONE-acetaminophen 5-325 MG per tablet  Commonly known as:  PERCOCET/ROXICET  Take 1-2 tablets by mouth every 6 (six) hours as needed.     ST JOHNS WORT PO  Take 1 capsule by mouth once.       Follow-up Information    Follow up with Janyth Pupa, M, DO.   Specialty:  Obstetrics and Gynecology   Why:  At [facility] for routine Gyn care   Contact information:   [place] [id number] [phone]       Follow up with [facility].   Why:  As needed for emergencies   Contact information:   [address] [phone] [place] Certified Nurse-Midwife 09/06/2014  7:30 PM

## 2014-09-07 LAB — HIV ANTIBODY (ROUTINE TESTING W REFLEX): HIV 1&2 Ab, 4th Generation: NONREACTIVE

## 2014-09-07 LAB — GC/CHLAMYDIA PROBE AMP
CT PROBE, AMP APTIMA: NEGATIVE
GC PROBE AMP APTIMA: NEGATIVE

## 2015-04-13 ENCOUNTER — Emergency Department (INDEPENDENT_AMBULATORY_CARE_PROVIDER_SITE_OTHER)
Admission: EM | Admit: 2015-04-13 | Discharge: 2015-04-13 | Disposition: A | Discharge status: Home / Self Care | Payer: Managed Care, Other (non HMO) | Source: Home / Self Care | Attending: Family Medicine | Admitting: Family Medicine

## 2015-04-13 ENCOUNTER — Encounter (HOSPITAL_COMMUNITY): Payer: Self-pay | Admitting: Emergency Medicine

## 2015-04-13 DIAGNOSIS — R220 Localized swelling, mass and lump, head: Secondary | ICD-10-CM

## 2015-04-13 DIAGNOSIS — B001 Herpesviral vesicular dermatitis: Secondary | ICD-10-CM | POA: Diagnosis not present

## 2015-04-13 MED ORDER — VALACYCLOVIR HCL 1 G PO TABS: 2000.0000 mg | ORAL_TABLET | Freq: Two times a day (BID) | ORAL | Status: DC

## 2015-04-13 MED ORDER — PREDNISONE 20 MG PO TABS: 60.0000 mg | ORAL_TABLET | Freq: Every day | ORAL | Status: DC

## 2015-04-13 MED ORDER — DEXAMETHASONE 4 MG PO TABS
10.0000 mg | ORAL_TABLET | Freq: Once | ORAL | Status: AC
  Administered 2015-04-13: 17:00:00 10 mg via ORAL

## 2015-04-13 MED ORDER — DEXAMETHASONE 2 MG PO TABS
ORAL_TABLET | ORAL | Status: AC
  Filled 2015-04-13: qty 5

## 2015-04-13 NOTE — ED Provider Notes (Signed)
CSN: [id number]     Arrival date & time 04/13/15  1533 History   First MD Initiated Contact with Patient 04/13/15 1605     Chief Complaint  Patient presents with  . Facial Swelling   (Consider location/radiation/quality/duration/timing/severity/associated sxs/prior Treatment) HPI  Facial swelling. Started this morning. Constant. Started on upper left lip with open sore. Patient has history of cold sores. Typically feels these coming on a day or 2 before but did not with this one. States that this morning which he woke up her entire left upper lip and left cheek and Sudafed were swollen. Edema is somewhat improved but ulceration is getting worse. Denies any dysphagia, dyspnea, nausea, vomiting, headache, neck stiffness, altered mentation. Patient states that she had a single left over PILL and took this without benefit.  Past Medical History  Diagnosis Date  . Medical history non-contributory    Past Surgical History  Procedure Laterality Date  . Tonsillectomy    . Foot surgery Left 2014 and 2015  . Dilation and curettage of uterus     No family history on file. History  Substance Use Topics  . Smoking status: Never Smoker   . Smokeless tobacco: Never Used  . Alcohol Use: Yes   OB History    Gravida Para Term Preterm AB TAB SAB Ectopic Multiple Living   1    1 1     0     Review of Systems Per HPI with all other pertinent systems negative.   Allergies  Amoxicillin; Imitrex; and Strawberry  Home Medications   Prior to Admission medications   Medication Sig Start Date End Date Taking? Authorizing Provider  famotidine (PEPCID) 20 MG tablet Take 1 tablet (20 mg total) by mouth 2 (two) times daily. Patient not taking: Reported on 04/13/2015 11/07/11 11/06/12  Junius Creamer, NP  ibuprofen (ADVIL,MOTRIN) 600 MG tablet Take 1 tablet (600 mg total) by mouth every 6 (six) hours as needed. 09/06/14   Kathie Dike Leftwich-Kirby, CNM  isometheptene-acetaminophen-dichloralphenazone (MIDRIN)  65-325-100 MG capsule Take 1 capsule by mouth 4 (four) times daily as needed. Maximum 5 capsules in 12 hours for migraine headaches, 8 capsules in 24 hours for tension headaches. Patient taking differently: Take 1 capsule by mouth 4 (four) times daily as needed for migraine or headache. Maximum 5 capsules in 12 hours for migraine headaches, 8 capsules in 24 hours for tension headaches. 07/04/13   Harden Mo, MD  megestrol (MEGACE) 40 MG tablet Take 3 tablets daily for 3 days, 2 tablets daily for 3 days, then 1 tablet daily until bleeding stops. Patient not taking: Reported on 04/13/2015 09/06/14   Kathie Dike Leftwich-Kirby, CNM  oxyCODONE-acetaminophen (PERCOCET/ROXICET) 5-325 MG per tablet Take 1-2 tablets by mouth every 6 (six) hours as needed. 09/06/14   Lattie Haw A Leftwich-Kirby, CNM  predniSONE (DELTASONE) 20 MG tablet Take 3 tablets (60 mg total) by mouth daily with breakfast. 04/13/15   Waldemar Dickens, MD  ST JOHNS WORT PO Take 1 capsule by mouth once.    Historical Provider, MD  valACYclovir (VALTREX) 1000 MG tablet Take 2 tablets (2,000 mg total) by mouth every 12 (twelve) hours. 04/13/15   Waldemar Dickens, MD   BP 116/75 mmHg  Pulse 70  Temp(Src) 99.7 F (37.6 C) (Oral)  Resp 16  SpO2 100% Physical Exam Physical Exam  Constitutional: oriented to person, place, and time. appears well-developed and well-nourished. No distress.  HENT:  Head: Normocephalic and atraumatic.  Eyes: EOMI. PERRL.  Neck: Normal  range of motion.  Cardiovascular: RRR, no m/r/g, 2+ distal pulses,  Pulmonary/Chest: Effort normal and breath sounds normal. No respiratory distress.  Abdominal: Soft. Bowel sounds are normal. NonTTP, no distension.  Musculoskeletal: Normal range of motion. Non ttp, no effusion.  Neurological: alert and oriented to person, place, and time.  Skin: Left upper lip with 0.5 x 1 cm ulcerated lesion with surrounding lip edema. No other facial swelling noted.  Psychiatric: normal mood and affect.  behavior is normal. Judgment and thought content normal.   ED Course  Procedures (including critical care time) Labs Review Labs Reviewed - No data to display  Imaging Review No results found.   MDM   1. Cold sore   2. Facial swelling    Valtrex 2 g every 122 doses. Decadron 10 mg by mouth given in clinic to help with overall edema. No evidence of Bell's palsy at this time. Additional prescription for steroids provided patient needs these for continued swelling. Patient go to ED if she gets worse.    Waldemar Dickens, MD 04/13/15 1728

## 2015-04-13 NOTE — ED Notes (Signed)
Reports waking this morning with facial swelling, lips swelling.  Patient took benadryl 2-3 hours ago.  No difficulty breathing or swallowing.  No known source for swelling

## 2015-04-13 NOTE — Discharge Instructions (Signed)
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 THis will need a high dose of valtrex to treat. You were given a dose of steroids to help with your symptoms. Please take the additional steroids if needed for additional swelling.

## 2015-04-25 ENCOUNTER — Encounter: Payer: Self-pay | Admitting: Family

## 2015-04-25 ENCOUNTER — Ambulatory Visit (INDEPENDENT_AMBULATORY_CARE_PROVIDER_SITE_OTHER): Payer: Managed Care, Other (non HMO) | Admitting: Family

## 2015-04-25 VITALS — BP 110/72 | HR 82 | Temp 98.3°F | Resp 18 | Ht 66.0 in | Wt 126.0 lb

## 2015-04-25 DIAGNOSIS — D179 Benign lipomatous neoplasm, unspecified: Secondary | ICD-10-CM

## 2015-04-25 DIAGNOSIS — B001 Herpesviral vesicular dermatitis: Secondary | ICD-10-CM

## 2015-04-25 HISTORY — DX: Herpesviral vesicular dermatitis: B00.1

## 2015-04-25 HISTORY — DX: Benign lipomatous neoplasm, unspecified: D17.9

## 2015-04-25 MED ORDER — VALACYCLOVIR HCL 1 G PO TABS: 2000.0000 mg | ORAL_TABLET | Freq: Two times a day (BID) | ORAL | Status: DC

## 2015-04-25 NOTE — Patient Instructions (Signed)
Thank you for choosing [facility].  Summary/Instructions:  Your prescription(s) have been submitted to your pharmacy or been printed and provided for you. Please take as directed and contact our office if you believe you are having problem(s) with the medication(s) or have any questions.  If your symptoms worsen or fail to improve, please contact our office for further instruction, or in case of emergency go directly to the emergency room at the closest medical facility.   Herpes Simplex Herpes simplex is generally classified as Type 1 or Type 2. Type 1 is generally the type that is responsible for cold sores. Type 2 is generally associated with sexually transmitted diseases. We now know that most of the thoughts on these viruses are inaccurate. We find that HSV1 is also present genitally and HSV2 can be present orally, but this will vary in different locations of the world. Herpes simplex is usually detected by doing a culture. Blood tests are also available for this virus; however, the accuracy is often not as good.  PREPARATION FOR TEST No preparation or fasting is necessary. NORMAL FINDINGS  No virus present  No HSV antigens or antibodies present Ranges for normal findings may vary among different laboratories and hospitals. You should always check with your doctor after having lab work or other tests done to discuss the meaning of your test results and whether your values are considered within normal limits. MEANING OF TEST  Your caregiver will go over the test results with you and discuss the importance and meaning of your results, as well as treatment options and the need for additional tests if necessary. OBTAINING THE TEST RESULTS  It is your responsibility to obtain your test results. Ask the lab or department performing the test when and how you will get your results. Document Released: 09/29/2004 Document Revised: 11/19/2011 Document Reviewed: 08/07/2008 [facility] Patient  Information 2015 [place], [place]. This information is not intended to replace advice given to you by your health care provider. Make sure you discuss any questions you have with your health care provider.

## 2015-04-25 NOTE — Progress Notes (Signed)
Pre visit review using our clinic review tool, if applicable. No additional management support is needed unless otherwise documented below in the visit note. 

## 2015-04-25 NOTE — Assessment & Plan Note (Signed)
Symptoms and exam described consistent with potential lipoma. Patient requests ultrasound to confirm. Continue to monitor at this time and follow up pending ultrasound results.

## 2015-04-25 NOTE — Assessment & Plan Note (Signed)
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 Currently maintained on Valtrex as needed. Discuss risks of transmission. Continue current dosage of Valtrex. Follow up if symptoms are no longer controlled.

## 2015-04-25 NOTE — Progress Notes (Signed)
Subjective:    Patient ID: Rebecca Benjamin, female    DOB: 1991-05-26, 24 y.o.   MRN: [id number]  Chief Complaint  Patient presents with  . Establish Care    wants to talk about getting an rx of prednisone and valtrex for cold sores that cause her lip to swell really bad    HPI:  Rebecca Benjamin is a 24 y.o. female with a PMH of depression who presents today for an office visit to establish care.  1.) Cold sores - Previous history of cold sores located around her mouth with the last being around 2013. Modifying factors include valtrex and prednisone. She was also has a history of angioedema that was mistaken for a cold sore. Takes the prednisone and valtrex when she feels like her symptoms are coming on. There currently no rash or pain.   2.) Spot on back - Associated symptom of a spot on her back has been going on for about 3 years. There are occasional changes in size of bigger and smaller. Denies any treatments or modifying factors that make it better or worse. There is no pain or tenderness.  Allergies  Allergen Reactions  . Amoxicillin Nausea And Vomiting  . Imitrex [Sumatriptan]     C/o felt generalized flushing, burning, rhinorrhea within 2-3 min of medication     Outpatient Prescriptions Prior to Visit  Medication Sig Dispense Refill  . predniSONE (DELTASONE) 20 MG tablet Take 3 tablets (60 mg total) by mouth daily with breakfast. 15 tablet 0  . valACYclovir (VALTREX) 1000 MG tablet Take 2 tablets (2,000 mg total) by mouth every 12 (twelve) hours. 4 tablet 0  . famotidine (PEPCID) 20 MG tablet Take 1 tablet (20 mg total) by mouth 2 (two) times daily. (Patient not taking: Reported on 04/13/2015) 30 tablet 0  . ibuprofen (ADVIL,MOTRIN) 600 MG tablet Take 1 tablet (600 mg total) by mouth every 6 (six) hours as needed. 30 tablet 0  . isometheptene-acetaminophen-dichloralphenazone (MIDRIN) 65-325-100 MG capsule Take 1 capsule by mouth 4 (four) times daily as needed. Maximum 5  capsules in 12 hours for migraine headaches, 8 capsules in 24 hours for tension headaches. (Patient taking differently: Take 1 capsule by mouth 4 (four) times daily as needed for migraine or headache. Maximum 5 capsules in 12 hours for migraine headaches, 8 capsules in 24 hours for tension headaches.) 30 capsule 0  . megestrol (MEGACE) 40 MG tablet Take 3 tablets daily for 3 days, 2 tablets daily for 3 days, then 1 tablet daily until bleeding stops. (Patient not taking: Reported on 04/13/2015) 20 tablet 0  . oxyCODONE-acetaminophen (PERCOCET/ROXICET) 5-325 MG per tablet Take 1-2 tablets by mouth every 6 (six) hours as needed. 10 tablet 0  . ST JOHNS WORT PO Take 1 capsule by mouth once.     No facility-administered medications prior to visit.     Past Medical History  Diagnosis Date  . Medical history non-contributory   . Depression   . H/O cold sores      Past Surgical History  Procedure Laterality Date  . Tonsillectomy    . Foot surgery Left 2014 and 2015  . Dilation and curettage of uterus       Family History  Problem Relation Age of Onset  . Mitral valve prolapse Mother   . Heart disease Mother   . Diabetes Mother   . Healthy Father   . Stroke Maternal Grandmother   . Lung cancer Maternal Grandfather   .  Migraines Paternal Grandmother      Social History   Social History  . Marital Status: Single    Spouse Name: N/A  . Number of Children: 0  . Years of Education: 15   Occupational History  . Payroll Specialist    Social History Main Topics  . Smoking status: Never Smoker   . Smokeless tobacco: Never Used  . Alcohol Use: Yes     Comment: Socially  . Drug Use: No  . Sexual Activity: Yes    Birth Control/ Protection: None   Other Topics Concern  . Not on file   Social History Narrative   Fun: [employer] out with her fiance, shop   Denies religious beliefs effecting health care.    Feels safe at home and denies abuse.      Review of Systems  Constitutional:  Negative for fever and chills.  HENT: Negative for facial swelling.   Skin: Negative for rash.  Neurological: Negative for headaches.      Objective:    BP 110/72 mmHg  Pulse 82  Temp(Src) 98.3 F (36.8 C) (Oral)  Resp 18  Ht 5\' 6"  (1.676 m)  Wt 126 lb (57.153 kg)  BMI 20.35 kg/m2  SpO2 97% Nursing note and vital signs reviewed.  Physical Exam  Constitutional: She is oriented to person, place, and time. She appears well-developed and well-nourished. No distress.  HENT:  Mouth/Throat: Uvula is midline, oropharynx is clear and moist and mucous membranes are normal.  Cardiovascular: Normal rate, regular rhythm, normal heart sounds and intact distal pulses.   Pulmonary/Chest: Effort normal and breath sounds normal.  Musculoskeletal:       Arms: Neurological: She is alert and oriented to person, place, and time.  Skin: Skin is warm and dry. No rash noted.  Psychiatric: She has a normal mood and affect. Her behavior is normal. Judgment and thought content normal.       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 Currently maintained on Valtrex as needed. Discuss risks of transmission. Continue current dosage of Valtrex. Follow up if symptoms are no longer controlled.      Relevant Medications   valACYclovir (VALTREX) 1000 MG tablet     Other   Lipoma    Symptoms and exam described consistent with potential lipoma. Patient requests ultrasound to confirm. Continue to monitor at this time and follow up pending ultrasound results.       Relevant Orders   [place] [facility]

## 2015-05-04 ENCOUNTER — Telehealth: Payer: Self-pay | Admitting: Family

## 2015-05-04 ENCOUNTER — Ambulatory Visit
Admission: RE | Admit: 2015-05-04 | Discharge: 2015-05-04 | Disposition: A | Discharge status: Home / Self Care | Payer: Managed Care, Other (non HMO) | Source: Ambulatory Visit | Attending: Family | Admitting: Family

## 2015-05-04 DIAGNOSIS — D179 Benign lipomatous neoplasm, unspecified: Secondary | ICD-10-CM

## 2015-05-04 NOTE — Telephone Encounter (Signed)
Please inform patient that the ultrasound as I anticipated is consistent with a lipoma which is a small amount of fatty tissue. Nothing needs to be done about this at this time and we can continue to monitor. If it does continue to grow or starts to cause pain, we can send her to general surgery for removal.

## 2015-05-06 NOTE — Telephone Encounter (Signed)
Pt aware of ultrasound results 

## 2016-01-07 ENCOUNTER — Encounter (HOSPITAL_COMMUNITY): Payer: Self-pay | Admitting: Emergency Medicine

## 2016-01-07 ENCOUNTER — Emergency Department (HOSPITAL_COMMUNITY)
Admission: EM | Admit: 2016-01-07 | Discharge: 2016-01-07 | Disposition: A | Discharge status: Home / Self Care | Payer: Commercial Managed Care - HMO | Attending: Emergency Medicine | Admitting: Emergency Medicine

## 2016-01-07 ENCOUNTER — Emergency Department (HOSPITAL_COMMUNITY): Payer: Commercial Managed Care - HMO

## 2016-01-07 DIAGNOSIS — R102 Pelvic and perineal pain: Secondary | ICD-10-CM | POA: Diagnosis not present

## 2016-01-07 DIAGNOSIS — Z8619 Personal history of other infectious and parasitic diseases: Secondary | ICD-10-CM | POA: Diagnosis not present

## 2016-01-07 DIAGNOSIS — Z8659 Personal history of other mental and behavioral disorders: Secondary | ICD-10-CM | POA: Diagnosis not present

## 2016-01-07 DIAGNOSIS — R51 Headache: Secondary | ICD-10-CM | POA: Diagnosis not present

## 2016-01-07 DIAGNOSIS — R519 Headache, unspecified: Secondary | ICD-10-CM

## 2016-01-07 DIAGNOSIS — Z880 Allergy status to penicillin: Secondary | ICD-10-CM | POA: Insufficient documentation

## 2016-01-07 LAB — COMPREHENSIVE METABOLIC PANEL
ALT: 11 U/L — AB (ref 14–54)
AST: 15 U/L (ref 15–41)
Albumin: 4.5 g/dL (ref 3.5–5.0)
Alkaline Phosphatase: 50 U/L (ref 38–126)
Anion gap: 11 (ref 5–15)
BUN: 14 mg/dL (ref 6–20)
CHLORIDE: 106 mmol/L (ref 101–111)
CO2: 25 mmol/L (ref 22–32)
Calcium: 9.3 mg/dL (ref 8.9–10.3)
Creatinine, Ser: 0.72 mg/dL (ref 0.44–1.00)
Glucose, Bld: 83 mg/dL (ref 65–99)
POTASSIUM: 3.4 mmol/L — AB (ref 3.5–5.1)
SODIUM: 142 mmol/L (ref 135–145)
Total Bilirubin: 0.4 mg/dL (ref 0.3–1.2)
Total Protein: 7.7 g/dL (ref 6.5–8.1)

## 2016-01-07 LAB — URINALYSIS, ROUTINE W REFLEX MICROSCOPIC
Bilirubin Urine: NEGATIVE
GLUCOSE, UA: NEGATIVE mg/dL
HGB URINE DIPSTICK: NEGATIVE
Ketones, ur: NEGATIVE mg/dL
LEUKOCYTES UA: NEGATIVE
Nitrite: NEGATIVE
PH: 6 (ref 5.0–8.0)
PROTEIN: NEGATIVE mg/dL
SPECIFIC GRAVITY, URINE: 1.027 (ref 1.005–1.030)

## 2016-01-07 LAB — CBC
HEMATOCRIT: 37.6 % (ref 36.0–46.0)
HEMOGLOBIN: 12.9 g/dL (ref 12.0–15.0)
MCH: 31.8 pg (ref 26.0–34.0)
MCHC: 34.3 g/dL (ref 30.0–36.0)
MCV: 92.6 fL (ref 78.0–100.0)
PLATELETS: 235 10*3/uL (ref 150–400)
RBC: 4.06 MIL/uL (ref 3.87–5.11)
RDW: 12.1 % (ref 11.5–15.5)
WBC: 8.4 10*3/uL (ref 4.0–10.5)

## 2016-01-07 LAB — WET PREP, GENITAL
SPERM: NONE SEEN
Trich, Wet Prep: NONE SEEN

## 2016-01-07 LAB — LIPASE, BLOOD: Lipase: 18 U/L (ref 11–51)

## 2016-01-07 MED ORDER — IBUPROFEN 800 MG PO TABS: 800.0000 mg | ORAL_TABLET | Freq: Three times a day (TID) | ORAL | Status: DC

## 2016-01-07 NOTE — ED Notes (Signed)
Patient transported to MRI 

## 2016-01-07 NOTE — ED Notes (Signed)
Patient reports she is having a "sharp, stabbing pain" to right side of head.  States "this is not a headache".  Reports this began 3 days ago intermittently but has increased in frequency and duration.  Reports LLQ abdominal pain as well with nausea.  Denies diarrhea.

## 2016-01-07 NOTE — ED Notes (Signed)
PT phone found left in bed after PT was discharge. Phone have been place in lost and found

## 2016-01-07 NOTE — Discharge Instructions (Signed)
Migraine Headache A migraine headache is an intense, throbbing pain on one or both sides of your head. A migraine can last for 30 minutes to several hours. CAUSES  The exact cause of a migraine headache is not always known. However, a migraine may be caused when nerves in the brain become irritated and release chemicals that cause inflammation. This causes pain. Certain things may also trigger migraines, such as:  Alcohol.  Smoking.  Stress.  Menstruation.  Aged cheeses.  Foods or drinks that contain nitrates, glutamate, aspartame, or tyramine.  Lack of sleep.  Chocolate.  Caffeine.  Hunger.  Physical exertion.  Fatigue.  Medicines used to treat chest pain (nitroglycerine), birth control pills, estrogen, and some blood pressure medicines. SIGNS AND SYMPTOMS  Pain on one or both sides of your head.  Pulsating or throbbing pain.  Severe pain that prevents daily activities.  Pain that is aggravated by any physical activity.  Nausea, vomiting, or both.  Dizziness.  Pain with exposure to bright lights, loud noises, or activity.  General sensitivity to bright lights, loud noises, or smells. Before you get a migraine, you may get warning signs that a migraine is coming (aura). An aura may include:  Seeing flashing lights.  Seeing bright spots, halos, or zigzag lines.  Having tunnel vision or blurred vision.  Having feelings of numbness or tingling.  Having trouble talking.  Having muscle weakness. DIAGNOSIS  A migraine headache is often diagnosed based on:  Symptoms.  Physical exam.  A CT scan or MRI of your head. These imaging tests cannot diagnose migraines, but they can help rule out other causes of headaches. TREATMENT Medicines may be given for pain and nausea. Medicines can also be given to help prevent recurrent migraines.  HOME CARE INSTRUCTIONS  Only take over-the-counter or prescription medicines for pain or discomfort as directed by your  health care provider. The use of long-term narcotics is not recommended.  Lie down in a dark, quiet room when you have a migraine.  Keep a journal to find out what may trigger your migraine headaches. For example, write down:  What you eat and drink.  How much sleep you get.  Any change to your diet or medicines.  Limit alcohol consumption.  Quit smoking if you smoke.  Get 7-9 hours of sleep, or as recommended by your health care provider.  Limit stress.  Keep lights dim if bright lights bother you and make your migraines worse. SEEK IMMEDIATE MEDICAL CARE IF:   Your migraine becomes severe.  You have a fever.  You have a stiff neck.  You have vision loss.  You have muscular weakness or loss of muscle control.  You start losing your balance or have trouble walking.  You feel faint or pass out.  You have severe symptoms that are different from your first symptoms. MAKE SURE YOU:   Understand these instructions.  Will watch your condition.  Will get help right away if you are not doing well or get worse.   This information is not intended to replace advice given to you by your health care provider. Make sure you discuss any questions you have with your health care provider.   Document Released: 08/27/2005 Document Revised: 09/17/2014 Document Reviewed: 05/04/2013 Elsevier Interactive Patient Education 2016 [employer] Pelvic Pain, Female Female pelvic pain can be caused by many different things and start from a variety of places. Pelvic pain refers to pain that is located in the lower half of the abdomen  and between your hips. The pain may occur over a short period of time (acute) or may be reoccurring (chronic). The cause of pelvic pain may be related to disorders affecting the female reproductive organs (gynecologic), but it may also be related to the bladder, kidney stones, an intestinal complication, or muscle or skeletal problems. Getting help right away for  pelvic pain is important, especially if there has been severe, sharp, or a sudden onset of unusual pain. It is also important to get help right away because some types of pelvic pain can be life threatening.  CAUSES  Below are only some of the causes of pelvic pain. The causes of pelvic pain can be in one of several categories.   Gynecologic.  Pelvic inflammatory disease.  Sexually transmitted infection.  Ovarian cyst or a twisted ovarian ligament (ovarian torsion).  Uterine lining that grows outside the uterus (endometriosis).  Fibroids, cysts, or tumors.  Ovulation.  Pregnancy.  Pregnancy that occurs outside the uterus (ectopic pregnancy).  Miscarriage.  Labor.  Abruption of the placenta or ruptured uterus.  Infection.  Uterine infection (endometritis).  Bladder infection.  Diverticulitis.  Miscarriage related to a uterine infection (septic abortion).  Bladder.  Inflammation of the bladder (cystitis).  Kidney stone(s).  Gastrointestinal.  Constipation.  Diverticulitis.  Neurologic.  Trauma.  Feeling pelvic pain because of mental or emotional causes (psychosomatic).  Cancers of the bowel or pelvis. EVALUATION  Your caregiver will want to take a careful history of your concerns. This includes recent changes in your health, a careful gynecologic history of your periods (menses), and a sexual history. Obtaining your family history and medical history is also important. Your caregiver may suggest a pelvic exam. A pelvic exam will help identify the location and severity of the pain. It also helps in the evaluation of which organ system may be involved. In order to identify the cause of the pelvic pain and be properly treated, your caregiver may order tests. These tests may include:   A pregnancy test.  Pelvic ultrasonography.  An X-ray exam of the abdomen.  A urinalysis or evaluation of vaginal discharge.  Blood tests. HOME CARE INSTRUCTIONS   Only  take over-the-counter or prescription medicines for pain, discomfort, or fever as directed by your caregiver.   Rest as directed by your caregiver.   Eat a balanced diet.   Drink enough fluids to make your urine clear or pale yellow, or as directed.   Avoid sexual intercourse if it causes pain.   Apply warm or cold compresses to the lower abdomen depending on which one helps the pain.   Avoid stressful situations.   Keep a journal of your pelvic pain. Write down when it started, where the pain is located, and if there are things that seem to be associated with the pain, such as food or your menstrual cycle.  Follow up with your caregiver as directed.  SEEK MEDICAL CARE IF:  Your medicine does not help your pain.  You have abnormal vaginal discharge. SEEK IMMEDIATE MEDICAL CARE IF:   You have heavy bleeding from the vagina.   Your pelvic pain increases.   You feel light-headed or faint.   You have chills.   You have pain with urination or blood in your urine.   You have uncontrolled diarrhea or vomiting.   You have a fever or persistent symptoms for more than 3 days.  You have a fever and your symptoms suddenly get worse.   You are being physically  or sexually abused.   This information is not intended to replace advice given to you by your health care provider. Make sure you discuss any questions you have with your health care provider.   Document Released: 07/24/2004 Document Revised: 05/18/2015 Document Reviewed: 12/17/2011 Elsevier Interactive Patient Education [employer].

## 2016-01-07 NOTE — ED Provider Notes (Signed)
CSN: [id number]     Arrival date & time 01/07/16  1148 History   First MD Initiated Contact with Patient 01/07/16 1311     Chief Complaint  Patient presents with  . Headache  . Abdominal Pain  . Mass     (Consider location/radiation/quality/duration/timing/severity/associated sxs/prior Treatment) HPI Patient reports that she has had headaches since she was 25 years of age. [place] however she has had a significant change in quality and severity of headaches. She has had a very intense, ice pick like pain to the right temple. She states it started 3 days ago and has been constant with waxing and waning severity. No fever, no chills, no sinus symptoms. She reports she chronically wears glasses but has not noted any immediate vision change. She does however feel like his headache is putting pressure behind her eye. No nausea or vomiting. No neck stiffness.  Patient has had sharp episodic left pelvic pain for months. No abnormal vaginal discharge or bleeding. No pain burning or urgency of urination. She states she'll get a sharp pain periodically that feels like it's in her pelvic organs. She states she had an IUD but it was removed in 2015. She states her doctor had some difficulty removing it but it was removed.she is not on any birth control since that time. Patient is sexually active. Past Medical History  Diagnosis Date  . Medical history non-contributory   . Depression   . H/O cold sores    Past Surgical History  Procedure Laterality Date  . Tonsillectomy    . Foot surgery Left 2014 and 2015  . Dilation and curettage of uterus     Family History  Problem Relation Age of Onset  . Mitral valve prolapse Mother   . Heart disease Mother   . Diabetes Mother   . Healthy Father   . Stroke Maternal Grandmother   . Lung cancer Maternal Grandfather   . Migraines Paternal Grandmother    Social History  Substance Use Topics  . Smoking status: Never Smoker   . Smokeless tobacco: Never Used   . Alcohol Use: Yes     Comment: Socially   OB History    Gravida Para Term Preterm AB TAB SAB Ectopic Multiple Living   1    1 1     0     Review of Systems  10 Systems reviewed and are negative for acute change except as noted in the HPI.   Allergies  Amoxicillin and Imitrex  Home Medications   Prior to Admission medications   Medication Sig Start Date End Date Taking? Authorizing Provider  Aspirin-Acetaminophen-Caffeine (GOODY HEADACHE PO) Take 1 each by mouth daily as needed (headache.).   Yes Historical Provider, MD  clobetasol (TEMOVATE) 0.05 % external solution Apply 1 application topically daily as needed. Eczema. 12/02/15  Yes Historical Provider, MD  ibuprofen (ADVIL,MOTRIN) 800 MG tablet Take 1 tablet (800 mg total) by mouth 3 (three) times daily. 01/07/16   Charlesetta Shanks, MD  predniSONE (DELTASONE) 20 MG tablet Take 3 tablets (60 mg total) by mouth daily with breakfast. Patient not taking: Reported on 01/07/2016 04/13/15   Waldemar Dickens, MD  valACYclovir (VALTREX) 1000 MG tablet Take 2 tablets (2,000 mg total) by mouth every 12 (twelve) hours. Patient not taking: Reported on 01/07/2016 04/25/15   Golden Circle, FNP   BP 119/76 mmHg  Pulse 71  Temp(Src) 98.2 F (36.8 C) (Oral)  Resp 17  Ht 5\' 6"  (1.676 m)  Wt 140  lb (63.504 kg)  BMI 22.61 kg/m2  SpO2 100%  LMP 12/24/2015 (Approximate) Physical Exam  Constitutional: She is oriented to person, place, and time. She appears well-developed and well-nourished.  HENT:  Head: Normocephalic and atraumatic.  Right Ear: External ear normal.  Left Ear: External ear normal.  Mouth/Throat: Oropharynx is clear and moist.  Bilateral TMs are normal without erythema or bulging. Oropharynx is normal with excellent dentition. Oropharynx widely patent.  Eyes: EOM are normal. Pupils are equal, round, and reactive to light.  Neck: Neck supple. No tracheal deviation present. No thyromegaly present.  Cardiovascular: Normal rate,  regular rhythm, normal heart sounds and intact distal pulses.   Pulmonary/Chest: Effort normal and breath sounds normal.  Abdominal: Soft. Bowel sounds are normal. She exhibits no distension. There is no tenderness.  Genitourinary:  Normal external female genitalia. Speculum examination cervix normal in appearance without erythema or discharge. Examination no cervical motion tenderness. Uterus nontender. Moderate tenderness in the left adnexa without appreciable mass. Right adnexa nontender.  Musculoskeletal: Normal range of motion. She exhibits no edema.  Lymphadenopathy:    She has no cervical adenopathy.  Neurological: She is alert and oriented to person, place, and time. She has normal strength. No cranial nerve deficit. She exhibits normal muscle tone. Coordination normal. GCS eye subscore is 4. GCS verbal subscore is 5. GCS motor subscore is 6.  Normal finger-nose examination. Normal heel shin examination. Motor strength 5 out of 5. No sensory deficit.  Skin: Skin is warm, dry and intact.  Psychiatric: She has a normal mood and affect.    ED Course  Procedures (including critical care time) Labs Review Labs Reviewed  WET PREP, GENITAL - Abnormal; Notable for the following:    Yeast Wet Prep HPF POC FEW (*)    Clue Cells Wet Prep HPF POC MANY (*)    WBC, Wet Prep HPF POC MANY (*)    All other components within normal limits  COMPREHENSIVE METABOLIC PANEL - Abnormal; Notable for the following:    Potassium 3.4 (*)    ALT 11 (*)    All other components within normal limits  LIPASE, BLOOD  CBC  URINALYSIS, ROUTINE W REFLEX MICROSCOPIC (NOT AT [facility])  RPR  HIV ANTIBODY (ROUTINE TESTING)  POC URINE PREG, ED  GC/CHLAMYDIA PROBE AMP ([place]) NOT AT [facility]    Imaging Review Mr Brain Wo Contrast (neuro Protocol)  01/07/2016  CLINICAL DATA:  Headache with sharp, stabbing pain on the right. Began 3 days ago intermittently but has increased and severity. Right hand tremor. EXAM: MRI  HEAD WITHOUT CONTRAST MRA HEAD WITHOUT CONTRAST TECHNIQUE: Multiplanar, multiecho pulse sequences of the brain and surrounding structures were obtained without intravenous contrast. Angiographic images of the head were obtained using MRA technique without contrast. COMPARISON:  Head CT 07/01/2009 FINDINGS: MRI HEAD FINDINGS There is prominent magnetic susceptibility artifact from orthodontic braces. This obscures much of the posterior fossa as well as the anterior half of the cerebral hemispheres on axial diffusion and T2* gradient echo imaging. Within this limitation, no acute infarct or hemorrhage is identified. This also results in lack of sulcal CSF signal suppression on FLAIR images which precludes assessment for subarachnoid hemorrhage. Ventricles and sulci are normal in size. No brain parenchymal signal abnormality is identified. Orbits are unremarkable. Visualized paranasal sinuses are clear. Mastoid air cells are clear. Major intracranial vascular flow voids are preserved. MRA HEAD FINDINGS The visualized distal vertebral arteries are widely patent and codominant. PICA, AICA, and SCA origins  are patent. Basilar artery is widely patent. There are patent posterior communicating arteries, right larger than left. PCAs are patent without evidence of significant stenosis. Internal carotid arteries are patent from skullbase to carotid termini without stenosis. ACAs and MCAs are patent without evidence of major branch occlusion or significant stenosis. There is normal variant anatomy at the level of the anterior communicating artery. No intracranial aneurysm is identified. IMPRESSION: 1. Prominent artifact from braces. No intracranial abnormality identified within this limitation. 2. Negative head MRA. Electronically Signed   By: Logan Bores M.D.   On: 01/07/2016 15:38   Mr Virgel Paling (cerebral Arteries)  01/07/2016  CLINICAL DATA:  Headache with sharp, stabbing pain on the right. Began 3 days ago intermittently  but has increased and severity. Right hand tremor. EXAM: MRI HEAD WITHOUT CONTRAST MRA HEAD WITHOUT CONTRAST TECHNIQUE: Multiplanar, multiecho pulse sequences of the brain and surrounding structures were obtained without intravenous contrast. Angiographic images of the head were obtained using MRA technique without contrast. COMPARISON:  Head CT 07/01/2009 FINDINGS: MRI HEAD FINDINGS There is prominent magnetic susceptibility artifact from orthodontic braces. This obscures much of the posterior fossa as well as the anterior half of the cerebral hemispheres on axial diffusion and T2* gradient echo imaging. Within this limitation, no acute infarct or hemorrhage is identified. This also results in lack of sulcal CSF signal suppression on FLAIR images which precludes assessment for subarachnoid hemorrhage. Ventricles and sulci are normal in size. No brain parenchymal signal abnormality is identified. Orbits are unremarkable. Visualized paranasal sinuses are clear. Mastoid air cells are clear. Major intracranial vascular flow voids are preserved. MRA HEAD FINDINGS The visualized distal vertebral arteries are widely patent and codominant. PICA, AICA, and SCA origins are patent. Basilar artery is widely patent. There are patent posterior communicating arteries, right larger than left. PCAs are patent without evidence of significant stenosis. Internal carotid arteries are patent from skullbase to carotid termini without stenosis. ACAs and MCAs are patent without evidence of major branch occlusion or significant stenosis. There is normal variant anatomy at the level of the anterior communicating artery. No intracranial aneurysm is identified. IMPRESSION: 1. Prominent artifact from braces. No intracranial abnormality identified within this limitation. 2. Negative head MRA. Electronically Signed   By: Logan Bores M.D.   On: 01/07/2016 15:38   I have personally reviewed and evaluated these images and lab results as part of  my medical decision-making.   EKG Interpretation None      MDM   Final diagnoses:  Acute nonintractable headache, unspecified headache type  Pelvic pain in female   Patient is having an atypical right-sided headache. Patient's main worry wears for brain aneurysm or tumor. MRI has ruled out these conditions. Patient does have history of headaches although this was not a normal pattern for her. I feel this may represent atypical migraine and have advised her to follow up with [place] [facility] for further assessment for possible atypical migraine. No signs of infectious etiology. Patient is otherwise well and stable in appearance. She has had long-standing intermittent pelvic pain. Pelvic examination is unremarkable, UA is negative and pregnancy is negative. Patient is safe to follow-up with her GYN regarding episodic pelvic pain.    Charlesetta Shanks, MD 01/07/16 [phone]

## 2016-01-08 LAB — HIV ANTIBODY (ROUTINE TESTING W REFLEX): HIV SCREEN 4TH GENERATION: NONREACTIVE

## 2016-01-08 LAB — RPR: RPR Ser Ql: NONREACTIVE

## 2016-01-09 LAB — GC/CHLAMYDIA PROBE AMP (~~LOC~~) NOT AT ARMC
CHLAMYDIA, DNA PROBE: NEGATIVE
NEISSERIA GONORRHEA: NEGATIVE

## 2016-01-10 ENCOUNTER — Ambulatory Visit: Payer: Managed Care, Other (non HMO) | Admitting: Internal Medicine

## 2016-01-10 ENCOUNTER — Telehealth: Payer: Self-pay | Admitting: Family

## 2016-01-10 DIAGNOSIS — Z0289 Encounter for other administrative examinations: Secondary | ICD-10-CM

## 2016-01-10 NOTE — Telephone Encounter (Signed)
Ok with me 

## 2016-01-10 NOTE — Telephone Encounter (Signed)
Pt request to transfer from [facility] to Dr. Quay Burow. Please advise.

## 2016-01-11 ENCOUNTER — Ambulatory Visit (INDEPENDENT_AMBULATORY_CARE_PROVIDER_SITE_OTHER): Payer: Commercial Managed Care - HMO | Admitting: Internal Medicine

## 2016-01-11 VITALS — BP 112/70 | HR 76 | Temp 98.4°F | Resp 20 | Wt 131.0 lb

## 2016-01-11 DIAGNOSIS — G44009 Cluster headache syndrome, unspecified, not intractable: Secondary | ICD-10-CM | POA: Insufficient documentation

## 2016-01-11 HISTORY — DX: Cluster headache syndrome, unspecified, not intractable: G44.009

## 2016-01-11 MED ORDER — PREDNISONE 10 MG PO TABS: ORAL_TABLET | ORAL | Status: DC

## 2016-01-11 MED ORDER — INDOMETHACIN 50 MG PO CAPS: ORAL_CAPSULE | ORAL | Status: DC

## 2016-01-11 NOTE — Patient Instructions (Signed)
Please take all new medication as prescribed  - the prednisone now, and the indocin (anti-inflammatory) for later if needed  Please continue all other medications as before, and refills have been done if requested.  You are otherwise up to date with prevention measures today.  Please keep your appointments with your specialists as you may have planned

## 2016-01-11 NOTE — Progress Notes (Signed)
   Subjective:    Patient ID: Rebecca Benjamin, female    DOB: April 23, 1991, 25 y.o.   MRN: [id number]  HPI   Here with new HA she is convinced is not migraine, has had many of those, is atypical for this. C/o Now with right sided sharp spasm pain, points to the right frontoparietal area, feels like an icepick (has been reading on the internet), moderate to severe, sharp intermittent several times per day for last 2-3 days.  Pt denies new neurological symptoms such as new headache, or facial or extremity weakness or numbness except for the above.  Pt denies chest pain, increased sob or doe, wheezing, orthopnea, PND, increased LE swelling, palpitations, dizziness or syncope.   Pt denies polydipsia, polyuria. Past Medical History  Diagnosis Date  . Medical history non-contributory   . Depression   . H/O cold sores    Past Surgical History  Procedure Laterality Date  . Tonsillectomy    . Foot surgery Left 2014 and 2015  . Dilation and curettage of uterus      reports that she has never smoked. She has never used smokeless tobacco. She reports that she drinks alcohol. She reports that she does not use illicit drugs. family history includes Diabetes in her mother; Healthy in her father; Heart disease in her mother; Lung cancer in her maternal grandfather; Migraines in her paternal grandmother; Mitral valve prolapse in her mother; Stroke in her maternal grandmother. Allergies  Allergen Reactions  . Amoxicillin Nausea And Vomiting  . Imitrex [Sumatriptan]     C/o felt generalized flushing, burning, rhinorrhea within 2-3 min of medication   Current Outpatient Prescriptions on File Prior to Visit  Medication Sig Dispense Refill  . ibuprofen (ADVIL,MOTRIN) 800 MG tablet Take 1 tablet (800 mg total) by mouth 3 (three) times daily. 21 tablet 0   No current facility-administered medications on file prior to visit.   Review of Systems  All otherwise neg per pt     Objective:   Physical Exam BP  112/70 mmHg  Pulse 76  Temp(Src) 98.4 F (36.9 C) (Oral)  Resp 20  Wt 131 lb (59.421 kg)  SpO2 98%  LMP 12/24/2015 (Approximate) VS noted,  Constitutional: Pt appears in no apparent distress HENT: Head: NCAT.  Right Ear: External ear normal.  Left Ear: External ear normal.  Eyes: . Pupils are equal, round, and reactive to light. Conjunctivae and EOM are normal Neck: Normal range of motion. Neck supple.  Cardiovascular: Normal rate and regular rhythm.   Pulmonary/Chest: Effort normal and breath sounds without rales or wheezing.  Neurological: Pt is alert. Not confused , motor 5/5 intact, cn 2-12 intact, sens/dtr intact, gait intact Skin: Skin is warm. No rash, no LE edema Psychiatric: Pt behavior is normal. No agitation.     Assessment & Plan:

## 2016-01-11 NOTE — Telephone Encounter (Signed)
ok 

## 2016-01-11 NOTE — Assessment & Plan Note (Signed)
Recent onset, 1 severe episode, now trying to come back it seems, for predpac asd, cannot take imitrex type meds or home o2, so for indocin tid prn if recurs

## 2016-01-11 NOTE — Telephone Encounter (Signed)
Left vm for pt to call back and make an appt to transfer to [facility].

## 2016-01-11 NOTE — Progress Notes (Signed)
Pre visit review using our clinic review tool, if applicable. No additional management support is needed unless otherwise documented below in the visit note. 

## 2016-10-15 ENCOUNTER — Inpatient Hospital Stay (HOSPITAL_COMMUNITY)
Admission: AD | Admit: 2016-10-15 | Discharge: 2016-10-15 | Disposition: A | Discharge status: Home / Self Care | Payer: Commercial Managed Care - HMO | Source: Ambulatory Visit | Attending: Obstetrics and Gynecology | Admitting: Obstetrics and Gynecology

## 2016-10-15 ENCOUNTER — Inpatient Hospital Stay (HOSPITAL_COMMUNITY): Payer: Commercial Managed Care - HMO

## 2016-10-15 ENCOUNTER — Encounter (HOSPITAL_COMMUNITY): Payer: Self-pay | Admitting: *Deleted

## 2016-10-15 DIAGNOSIS — R109 Unspecified abdominal pain: Secondary | ICD-10-CM | POA: Diagnosis present

## 2016-10-15 DIAGNOSIS — O99342 Other mental disorders complicating pregnancy, second trimester: Secondary | ICD-10-CM | POA: Diagnosis not present

## 2016-10-15 DIAGNOSIS — Z3A00 Weeks of gestation of pregnancy not specified: Secondary | ICD-10-CM | POA: Insufficient documentation

## 2016-10-15 DIAGNOSIS — F329 Major depressive disorder, single episode, unspecified: Secondary | ICD-10-CM | POA: Diagnosis not present

## 2016-10-15 DIAGNOSIS — Z3A01 Less than 8 weeks gestation of pregnancy: Secondary | ICD-10-CM | POA: Diagnosis not present

## 2016-10-15 DIAGNOSIS — O26899 Other specified pregnancy related conditions, unspecified trimester: Secondary | ICD-10-CM

## 2016-10-15 DIAGNOSIS — O208 Other hemorrhage in early pregnancy: Secondary | ICD-10-CM | POA: Diagnosis not present

## 2016-10-15 DIAGNOSIS — Z880 Allergy status to penicillin: Secondary | ICD-10-CM | POA: Insufficient documentation

## 2016-10-15 DIAGNOSIS — O209 Hemorrhage in early pregnancy, unspecified: Secondary | ICD-10-CM | POA: Insufficient documentation

## 2016-10-15 DIAGNOSIS — O26891 Other specified pregnancy related conditions, first trimester: Secondary | ICD-10-CM | POA: Diagnosis present

## 2016-10-15 DIAGNOSIS — O3680X0 Pregnancy with inconclusive fetal viability, not applicable or unspecified: Secondary | ICD-10-CM

## 2016-10-15 LAB — CBC WITH DIFFERENTIAL/PLATELET
Basophils Absolute: 0 10*3/uL (ref 0.0–0.1)
Basophils Relative: 0 %
EOS ABS: 0 10*3/uL (ref 0.0–0.7)
EOS PCT: 0 %
HCT: 33.9 % — ABNORMAL LOW (ref 36.0–46.0)
Hemoglobin: 11.9 g/dL — ABNORMAL LOW (ref 12.0–15.0)
LYMPHS ABS: 2.3 10*3/uL (ref 0.7–4.0)
Lymphocytes Relative: 28 %
MCH: 31.3 pg (ref 26.0–34.0)
MCHC: 35.1 g/dL (ref 30.0–36.0)
MCV: 89.2 fL (ref 78.0–100.0)
MONOS PCT: 3 %
Monocytes Absolute: 0.2 10*3/uL (ref 0.1–1.0)
Neutro Abs: 5.6 10*3/uL (ref 1.7–7.7)
Neutrophils Relative %: 69 %
PLATELETS: 222 10*3/uL (ref 150–400)
RBC: 3.8 MIL/uL — ABNORMAL LOW (ref 3.87–5.11)
RDW: 12.2 % (ref 11.5–15.5)
WBC: 8.1 10*3/uL (ref 4.0–10.5)

## 2016-10-15 LAB — URINALYSIS, ROUTINE W REFLEX MICROSCOPIC
BILIRUBIN URINE: NEGATIVE
GLUCOSE, UA: NEGATIVE mg/dL
Ketones, ur: 20 mg/dL — AB
LEUKOCYTES UA: NEGATIVE
NITRITE: NEGATIVE
PH: 5 (ref 5.0–8.0)
Protein, ur: NEGATIVE mg/dL
Specific Gravity, Urine: 1.026 (ref 1.005–1.030)

## 2016-10-15 LAB — POCT PREGNANCY, URINE: Preg Test, Ur: POSITIVE — AB

## 2016-10-15 LAB — HCG, QUANTITATIVE, PREGNANCY: hCG, Beta Chain, Quant, S: 2068 m[IU]/mL — ABNORMAL HIGH (ref <5)

## 2016-10-15 NOTE — Discharge Instructions (Signed)

## 2016-10-15 NOTE — MAU Note (Signed)
Found out preg last Thurs.  Was [place] the driv[place]ru, sneezed and felt a sharp pain in [place] , when got home noted blood in underwear, small amt continues.  Has had some cramping since last Monday.  Had blood drawn on Thurs (301), Sat it was 814.

## 2016-10-15 NOTE — MAU Provider Note (Signed)
History     CSN: [id number]  Arrival date and time: 10/15/16 1810   First Provider Initiated Contact with Patient 10/15/16 1843      Chief Complaint  Patient presents with  . Abdominal Pain  . Vaginal Bleeding   HPI Ms. Rebecca Benjamin is a 26 y.o. G2P0010 at [redacted]w[redacted]d who presents to MAU today with complaint of vaginal bleeding and lower abdominal pain. The patient states that she had hCG drawn in the office on Thursday (301) and Saturday (814). She is scheduled for [place] next Monday. She states that on her way home tonight she sneezed and noted increased RLQ abdominal pain. She states when she arrived at home she had a small amount of blood in her underwear, but more than she had noted in the past. She states only mild cramping now. She has not taken anything for pain. She denies fever or N/V.   OB History    Gravida Para Term Preterm AB Living   2       1 0   SAB TAB Ectopic Multiple Live Births     1            Past Medical History:  Diagnosis Date  . Depression   . H/O cold sores   . Medical history non-contributory     Past Surgical History:  Procedure Laterality Date  . DILATION AND CURETTAGE OF UTERUS    . FOOT SURGERY Left 2014 and 2015  . TONSILLECTOMY      Family History  Problem Relation Age of Onset  . Mitral valve prolapse Mother   . Heart disease Mother   . Diabetes Mother   . Healthy Father   . Stroke Maternal Grandmother   . Lung cancer Maternal Grandfather   . Migraines Paternal Grandmother     Social History  Substance Use Topics  . Smoking status: Never Smoker  . Smokeless tobacco: Never Used  . Alcohol use Yes     Comment: Socially    Allergies:  Allergies  Allergen Reactions  . Amoxicillin Nausea And Vomiting    Has patient had a PCN reaction causing immediate rash, facial/tongue/throat swelling, SOB or lightheadedness with hypotension: no Has patient had a PCN reaction causing severe rash involving mucus membranes or skin necrosis: no Has  patient had a PCN reaction that required hospitalization no Has patient had a PCN reaction occurring within the last 10 years: no If all of the above answers are "NO", then may proceed with Cephalosporin use.   . Imitrex [Sumatriptan]     C/o felt generalized flushing, burning, rhinorrhea within 2-3 min of medication    No prescriptions prior to admission.    Review of Systems  Constitutional: Negative for fever.  Gastrointestinal: Positive for abdominal pain and nausea. Negative for constipation, diarrhea and vomiting.  Genitourinary: Positive for vaginal bleeding. Negative for dysuria, frequency, urgency and vaginal discharge.   Physical Exam   Blood pressure 125/85, pulse 93, temperature 98.4 F (36.9 C), temperature source Oral, resp. rate 18, height 5\' 6"  (1.676 m), weight 136 lb 12 oz (62 kg), last menstrual period 09/10/2016.  Physical Exam  Nursing note and vitals reviewed. Constitutional: She is oriented to person, place, and time. She appears well-developed and well-nourished. No distress.  HENT:  Head: Normocephalic and atraumatic.  Cardiovascular: Normal rate.   Respiratory: Effort normal.  GI: Soft. She exhibits no distension and no mass. There is tenderness (mild suprapubic tenderness to palpation more on the right than  left). There is no rebound and no guarding.  Genitourinary: Uterus is not enlarged and not tender. Cervix exhibits no motion tenderness, no discharge and no friability. Right adnexum displays no mass and no tenderness. Left adnexum displays no mass and no tenderness. There is bleeding (small brown) in the vagina. No vaginal discharge found.  Genitourinary Comments: Cervix: closed, thick, firm  Neurological: She is alert and oriented to person, place, and time.  Skin: Skin is warm and dry. No erythema.  Psychiatric: She has a normal mood and affect.    Results for orders placed or performed during the hospital encounter of 10/15/16 (from the past 24  hour(s))  Urinalysis, Routine w reflex microscopic     Status: Abnormal   Collection Time: 10/15/16  6:16 PM  Result Value Ref Range   Color, Urine YELLOW YELLOW   APPearance CLEAR CLEAR   Specific Gravity, Urine 1.026 1.005 - 1.030   pH 5.0 5.0 - 8.0   Glucose, UA NEGATIVE NEGATIVE mg/dL   Hgb urine dipstick MODERATE (A) NEGATIVE   Bilirubin Urine NEGATIVE NEGATIVE   Ketones, ur 20 (A) NEGATIVE mg/dL   Protein, ur NEGATIVE NEGATIVE mg/dL   Nitrite NEGATIVE NEGATIVE   Leukocytes, UA NEGATIVE NEGATIVE   RBC / HPF 0-5 0 - 5 RBC/hpf   WBC, UA 0-5 0 - 5 WBC/hpf   Bacteria, UA RARE (A) NONE SEEN   Squamous Epithelial / LPF 0-5 (A) NONE SEEN   Mucous PRESENT   Pregnancy, urine POC     Status: Abnormal   Collection Time: 10/15/16  6:45 PM  Result Value Ref Range   Preg Test, Ur POSITIVE (A) NEGATIVE  CBC with Differential/Platelet     Status: Abnormal   Collection Time: 10/15/16  7:05 PM  Result Value Ref Range   WBC 8.1 4.0 - 10.5 K/uL   RBC 3.80 (L) 3.87 - 5.11 MIL/uL   Hemoglobin 11.9 (L) 12.0 - 15.0 g/dL   HCT 33.9 (L) 36.0 - 46.0 %   MCV 89.2 78.0 - 100.0 fL   MCH 31.3 26.0 - 34.0 pg   MCHC 35.1 30.0 - 36.0 g/dL   RDW 12.2 11.5 - 15.5 %   Platelets 222 150 - 400 K/uL   Neutrophils Relative % 69 %   Neutro Abs 5.6 1.7 - 7.7 K/uL   Lymphocytes Relative 28 %   Lymphs Abs 2.3 0.7 - 4.0 K/uL   Monocytes Relative 3 %   Monocytes Absolute 0.2 0.1 - 1.0 K/uL   Eosinophils Relative 0 %   Eosinophils Absolute 0.0 0.0 - 0.7 K/uL   Basophils Relative 0 %   Basophils Absolute 0.0 0.0 - 0.1 K/uL  ABO/Rh     Status: None (Preliminary result)   Collection Time: 10/15/16  7:05 PM  Result Value Ref Range   ABO/RH(D) A POS   hCG, quantitative, pregnancy     Status: Abnormal   Collection Time: 10/15/16  7:05 PM  Result Value Ref Range   hCG, Beta Chain, Quant, S 2,068 (H) <5 mIU/mL   US Ob Comp Less 14 Wks  Result Date: 10/15/2016 CLINICAL DATA:  Vaginal bleeding and cramping EXAM:  OBSTETRIC <14 WK [place] AND TRANSVAGINAL OB US TECHNIQUE: Both transabdominal and transvaginal ultrasound examinations were performed for complete evaluation of the gestation as well as the maternal uterus, adnexal regions, and pelvic cul-de-sac. Transvaginal technique was performed to assess early pregnancy. COMPARISON:  None. FINDINGS: Intrauterine gestational sac: Not visualized Yolk sac:  Not visualized Embryo:  Not visualized Cardiac Activity: Not visualized Subchorionic hemorrhage:  None visualized. Maternal uterus/adnexae: Uterus measures 8.3 x 5.3 x 5.4 cm. No intrauterine mass. Endometrium measures 20 mm with a smooth contour. Left ovary measures 3.0 x 2.5 x 3.4 cm. Right ovary measures 3.2 x 2.4 x 3.1 cm. There are follicles in each ovary. Trace free pelvic fluid. IMPRESSION: No intrauterine gestation is evident. Currently beta HCG value is pending. Assuming positive beta HCG value, differential considerations for this finding include intrauterine gestation too early to be seen by either transabdominal or transvaginal technique, recent spontaneous abortion, or possible ectopic gestation. Given this circumstance. Close clinical and laboratory surveillance advised. Timing of repeat ultrasound in part will depend on beta HCG values going forward. Trace free pelvic fluid may be physiologic. Endometrium is somewhat thickened if beta HCG value returns negative. Electronically Signed   By: Lowella Grip III M.D.   On: 10/15/2016 19:50   US Ob Transvaginal  Result Date: 10/15/2016 CLINICAL DATA:  Vaginal bleeding and cramping EXAM: OBSTETRIC <14 WK [place] AND TRANSVAGINAL OB US TECHNIQUE: Both transabdominal and transvaginal ultrasound examinations were performed for complete evaluation of the gestation as well as the maternal uterus, adnexal regions, and pelvic cul-de-sac. Transvaginal technique was performed to assess early pregnancy. COMPARISON:  None. FINDINGS: Intrauterine gestational sac: Not visualized Yolk  sac:  Not visualized Embryo:  Not visualized Cardiac Activity: Not visualized Subchorionic hemorrhage:  None visualized. Maternal uterus/adnexae: Uterus measures 8.3 x 5.3 x 5.4 cm. No intrauterine mass. Endometrium measures 20 mm with a smooth contour. Left ovary measures 3.0 x 2.5 x 3.4 cm. Right ovary measures 3.2 x 2.4 x 3.1 cm. There are follicles in each ovary. Trace free pelvic fluid. IMPRESSION: No intrauterine gestation is evident. Currently beta HCG value is pending. Assuming positive beta HCG value, differential considerations for this finding include intrauterine gestation too early to be seen by either transabdominal or transvaginal technique, recent spontaneous abortion, or possible ectopic gestation. Given this circumstance. Close clinical and laboratory surveillance advised. Timing of repeat ultrasound in part will depend on beta HCG values going forward. Trace free pelvic fluid may be physiologic. Endometrium is somewhat thickened if beta HCG value returns negative. Electronically Signed   By: Lowella Grip III M.D.   On: 10/15/2016 19:50    MAU Course  Procedures None  MDM +UPT UA, wet prep, GC/chlamydia, CBC, ABO/Rh, quant hCG, HIV, RPR and [place] today to rule out ectopic pregnancy Discussed with Dr. Landry Mellow. Follow-up in MAU in 48 hours for repeat hCG and [place] due to suspicion of ectopic.  Assessment and Plan  A: Pregnancy of unknown location Vaginal bleeding in pregnancy, first trimester   P: Discharge home Ectopic precautions discussed Patient advised to follow-up in MAU on Wednesday evening for repeat labs and [place] or sooner if her condition were to change or worsen   Luvenia Redden, PA-C  10/15/2016, 9:47 PM

## 2016-10-16 ENCOUNTER — Inpatient Hospital Stay (HOSPITAL_COMMUNITY)
Admission: AD | Admit: 2016-10-16 | Discharge: 2016-10-16 | Disposition: A | Discharge status: Home / Self Care | Payer: Commercial Managed Care - HMO | Source: Ambulatory Visit | Attending: Obstetrics and Gynecology | Admitting: Obstetrics and Gynecology

## 2016-10-16 ENCOUNTER — Other Ambulatory Visit: Payer: Self-pay | Admitting: Obstetrics and Gynecology

## 2016-10-16 ENCOUNTER — Encounter (HOSPITAL_COMMUNITY): Payer: Self-pay

## 2016-10-16 DIAGNOSIS — O209 Hemorrhage in early pregnancy, unspecified: Secondary | ICD-10-CM | POA: Diagnosis not present

## 2016-10-16 DIAGNOSIS — Z3A00 Weeks of gestation of pregnancy not specified: Secondary | ICD-10-CM | POA: Diagnosis not present

## 2016-10-16 DIAGNOSIS — Z3490 Encounter for supervision of normal pregnancy, unspecified, unspecified trimester: Secondary | ICD-10-CM | POA: Insufficient documentation

## 2016-10-16 DIAGNOSIS — O3680X0 Pregnancy with inconclusive fetal viability, not applicable or unspecified: Secondary | ICD-10-CM

## 2016-10-16 LAB — CBC
HCT: 33.3 % — ABNORMAL LOW (ref 36.0–46.0)
Hemoglobin: 11.6 g/dL — ABNORMAL LOW (ref 12.0–15.0)
MCH: 31.4 pg (ref 26.0–34.0)
MCHC: 34.8 g/dL (ref 30.0–36.0)
MCV: 90.2 fL (ref 78.0–100.0)
PLATELETS: 223 10*3/uL (ref 150–400)
RBC: 3.69 MIL/uL — ABNORMAL LOW (ref 3.87–5.11)
RDW: 12.1 % (ref 11.5–15.5)
WBC: 8.3 10*3/uL (ref 4.0–10.5)

## 2016-10-16 LAB — ABO/RH: ABO/RH(D): A POS

## 2016-10-16 LAB — HCG, QUANTITATIVE, PREGNANCY: HCG, BETA CHAIN, QUANT, S: 2430 m[IU]/mL — AB (ref <5)

## 2016-10-16 NOTE — MAU Note (Signed)
Same thing today, spotting and cramping.  Is afraid, doesn't want it to be in her tubes.  Not saturating pads.  Cramping is not as bad today.  Had A  brief, sharp shooting pain on the left side when she stood up.

## 2016-10-16 NOTE — Progress Notes (Signed)
Pt is very nervous about the bleeding that she is having.  I offered emotional support as she waited and offered prayer at her request.    Please page as additional needs arise.  [place], [place] Pager, [phone] 4:37 PM    10/16/16 1623  Advance Directives (For Healthcare)  Does Patient Have a Medical Advance Directive? No  Would patient like information on creating a medical advance directive? No - Patient declined

## 2016-10-16 NOTE — Discharge Instructions (Signed)
Vaginal Bleeding During Pregnancy, First Trimester A small amount of bleeding (spotting) from the vagina is common in early pregnancy. Sometimes the bleeding is normal and is not a problem, and sometimes it is a sign of something serious. Be sure to tell your doctor about any bleeding from your vagina right away. Follow these instructions at home:  Watch your condition for any changes.  Follow your doctor's instructions about how active you can be.  If you are on bed rest:  You may need to stay in bed and only get up to use the bathroom.  You may be allowed to do some activities.  If you need help, make plans for someone to help you.  Write down:  The number of pads you use each day.  How often you change pads.  How soaked (saturated) your pads are.  Do not use tampons.  Do not douche.  Do not have sex or orgasms until your doctor says it is okay.  If you pass any tissue from your vagina, save the tissue so you can show it to your doctor.  Only take medicines as told by your doctor.  Do not take aspirin because it can make you bleed.  Keep all follow-up visits as told by your doctor. Contact a doctor if:  You bleed from your vagina.  You have cramps.  You have labor pains.  You have a fever that does not go away after you take medicine. Get help right away if:  You have very bad cramps in your back or belly (abdomen).  You pass large clots or tissue from your vagina.  You bleed more.  You feel light-headed or weak.  You pass out (faint).  You have chills.  You are leaking fluid or have a gush of fluid from your vagina.  You pass out while pooping (having a bowel movement). This information is not intended to replace advice given to you by your health care provider. Make sure you discuss any questions you have with your health care provider. Document Released: 01/11/2014 Document Revised: 02/02/2016 Document Reviewed: 05/04/2013 Elsevier Interactive  Patient Education  2017 [place]   Threatened Miscarriage A threatened miscarriage is when you have vaginal bleeding during your first 20 weeks of pregnancy but the pregnancy has not ended. Your doctor will do tests to make sure you are still pregnant. The cause of the bleeding may not be known. This condition does not mean your pregnancy will end. It does increase the risk of it ending (complete miscarriage). Follow these instructions at home:  Make sure you keep all your doctor visits for prenatal care.  Get plenty of rest.  Do not have sex or use tampons if you have vaginal bleeding.  Do not douche.  Do not smoke or use drugs.  Do not drink alcohol.  Avoid caffeine. Contact a doctor if:  You have light bleeding from your vagina.  You have belly pain or cramping.  You have a fever. Get help right away if:  You have heavy bleeding from your vagina.  You have clots of blood coming from your vagina.  You have bad pain or cramps in your low back or belly.  You have fever, chills, and bad belly pain. This information is not intended to replace advice given to you by your health care provider. Make sure you discuss any questions you have with your health care provider. Document Released: 08/09/2008 Document Revised: 02/02/2016 Document Reviewed: 06/23/2013 Elsevier Interactive Patient Education  2017 [place].

## 2016-10-16 NOTE — MAU Provider Note (Signed)
Chief Complaint: Vaginal bleeding and cramping  SUBJECTIVE HPI: Rebecca Benjamin is a 26 y.o. G2P0010 at [redacted]w[redacted]d who presents to [facility] Admissions reporting worsening VB.  Patient was seen yesterday, found to have elevated BHCG, but TVUS was unable to locate a IUP or any pregnancy. Appropriate BHCG rises: 301(office)>814 (office)>2000 (MAU) (drawn every 2 days). At around 2:30pm she had a sharp stabbing pain, then a large spot of bright red blood (no clot, not soaking pad just a large spot on panty liner), then the pain went away. Then just spotting brown blood since. Denies dizziness. No current pain, minimal cramping. No further active bleeding. No CP/SOB.     Past Medical History:  Diagnosis Date  . Depression   . H/O cold sores   . Medical history non-contributory    OB History  Gravida Para Term Preterm AB Living  2       1 0  SAB TAB Ectopic Multiple Live Births    1          # Outcome Date GA Lbr Len/2nd Weight Sex Delivery Anes PTL Lv  2 Current           1 TAB              Past Surgical History:  Procedure Laterality Date  . DILATION AND CURETTAGE OF UTERUS    . FOOT SURGERY Left 2014 and 2015  . TONSILLECTOMY     Social History   Social History  . Marital status: Married    Spouse name: N/A  . Number of children: 0  . Years of education: 15   Occupational History  . Payroll Specialist    Social History Main Topics  . Smoking status: Never Smoker  . Smokeless tobacco: Never Used  . Alcohol use Yes     Comment: Socially  . Drug use: No  . Sexual activity: Yes    Birth control/ protection: None   Other Topics Concern  . Not on file   Social History Narrative   Fun: [employer] out with her fiance, shop   Denies religious beliefs effecting health care.    Feels safe at home and denies abuse.    No current facility-administered medications on file prior to encounter.    Current Outpatient Prescriptions on File Prior to Encounter  Medication Sig Dispense  Refill  . Prenatal Vit-Fe Fumarate-FA (PRENATAL MULTIVITAMIN) TABS tablet Take 1 tablet by mouth daily at 12 noon.     Allergies  Allergen Reactions  . Amoxicillin Nausea And Vomiting    Has patient had a PCN reaction causing immediate rash, facial/tongue/throat swelling, SOB or lightheadedness with hypotension: no Has patient had a PCN reaction causing severe rash involving mucus membranes or skin necrosis: no Has patient had a PCN reaction that required hospitalization no Has patient had a PCN reaction occurring within the last 10 years: no If all of the above answers are "NO", then may proceed with Cephalosporin use.   . Imitrex [Sumatriptan]     C/o felt generalized flushing, burning, rhinorrhea within 2-3 min of medication    I have reviewed the past Medical Hx, Surgical Hx, Social Hx, Allergies and Medications.   REVIEW OF SYSTEMS  A comprehensive ROS was negative except per HPI.    OBJECTIVE Patient Vitals for the past 24 hrs:  BP Temp Temp src Pulse Resp Height Weight  10/16/16 1532 115/78 99.2 F (37.3 C) Oral 93 18 5\' 6"  (1.676 m) 136 lb (61.7 kg)  PHYSICAL EXAM Constitutional: Well-developed, well-nourished female in no acute distress.  Cardiovascular: normal rate, rhythm, no murmurs Respiratory: normal rate and effort. CTAB GI: Abd soft, non-tender, non-distended. Pos BS x 4 MS: Extremities nontender, no edema, normal ROM Neurologic: Alert and oriented x 4.  GU: Neg CVAT. SPECULUM EXAM: NEFG, physiologic discharge, minimal old blood noted in vault without clots or active bleeding from cervix, cervix clean BIMANUAL: cervix closed; uterus normal size, no adnexal tenderness or masses. No CMT.  LAB RESULTS Results for orders placed or performed during the hospital encounter of 10/15/16 (from the past 24 hour(s))  Urinalysis, Routine w reflex microscopic     Status: Abnormal   Collection Time: 10/15/16  6:16 PM  Result Value Ref Range   Color, Urine YELLOW  YELLOW   APPearance CLEAR CLEAR   Specific Gravity, Urine 1.026 1.005 - 1.030   pH 5.0 5.0 - 8.0   Glucose, UA NEGATIVE NEGATIVE mg/dL   Hgb urine dipstick MODERATE (A) NEGATIVE   Bilirubin Urine NEGATIVE NEGATIVE   Ketones, ur 20 (A) NEGATIVE mg/dL   Protein, ur NEGATIVE NEGATIVE mg/dL   Nitrite NEGATIVE NEGATIVE   Leukocytes, UA NEGATIVE NEGATIVE   RBC / HPF 0-5 0 - 5 RBC/hpf   WBC, UA 0-5 0 - 5 WBC/hpf   Bacteria, UA RARE (A) NONE SEEN   Squamous Epithelial / LPF 0-5 (A) NONE SEEN   Mucous PRESENT   Pregnancy, urine POC     Status: Abnormal   Collection Time: 10/15/16  6:45 PM  Result Value Ref Range   Preg Test, Ur POSITIVE (A) NEGATIVE  CBC with Differential/Platelet     Status: Abnormal   Collection Time: 10/15/16  7:05 PM  Result Value Ref Range   WBC 8.1 4.0 - 10.5 K/uL   RBC 3.80 (L) 3.87 - 5.11 MIL/uL   Hemoglobin 11.9 (L) 12.0 - 15.0 g/dL   HCT 33.9 (L) 36.0 - 46.0 %   MCV 89.2 78.0 - 100.0 fL   MCH 31.3 26.0 - 34.0 pg   MCHC 35.1 30.0 - 36.0 g/dL   RDW 12.2 11.5 - 15.5 %   Platelets 222 150 - 400 K/uL   Neutrophils Relative % 69 %   Neutro Abs 5.6 1.7 - 7.7 K/uL   Lymphocytes Relative 28 %   Lymphs Abs 2.3 0.7 - 4.0 K/uL   Monocytes Relative 3 %   Monocytes Absolute 0.2 0.1 - 1.0 K/uL   Eosinophils Relative 0 %   Eosinophils Absolute 0.0 0.0 - 0.7 K/uL   Basophils Relative 0 %   Basophils Absolute 0.0 0.0 - 0.1 K/uL  ABO/Rh     Status: None   Collection Time: 10/15/16  7:05 PM  Result Value Ref Range   ABO/RH(D) A POS   hCG, quantitative, pregnancy     Status: Abnormal   Collection Time: 10/15/16  7:05 PM  Result Value Ref Range   hCG, Beta Chain, Quant, S 2,068 (H) <5 mIU/mL    IMAGING US Ob Comp Less 14 Wks  Result Date: 10/15/2016 CLINICAL DATA:  Vaginal bleeding and cramping EXAM: OBSTETRIC <14 WK [place] AND TRANSVAGINAL OB US TECHNIQUE: Both transabdominal and transvaginal ultrasound examinations were performed for complete evaluation of the  gestation as well as the maternal uterus, adnexal regions, and pelvic cul-de-sac. Transvaginal technique was performed to assess early pregnancy. COMPARISON:  None. FINDINGS: Intrauterine gestational sac: Not visualized Yolk sac:  Not visualized Embryo:  Not visualized Cardiac Activity: Not visualized Subchorionic hemorrhage:  None visualized. Maternal uterus/adnexae: Uterus measures 8.3 x 5.3 x 5.4 cm. No intrauterine mass. Endometrium measures 20 mm with a smooth contour. Left ovary measures 3.0 x 2.5 x 3.4 cm. Right ovary measures 3.2 x 2.4 x 3.1 cm. There are follicles in each ovary. Trace free pelvic fluid. IMPRESSION: No intrauterine gestation is evident. Currently beta HCG value is pending. Assuming positive beta HCG value, differential considerations for this finding include intrauterine gestation too early to be seen by either transabdominal or transvaginal technique, recent spontaneous abortion, or possible ectopic gestation. Given this circumstance. Close clinical and laboratory surveillance advised. Timing of repeat ultrasound in part will depend on beta HCG values going forward. Trace free pelvic fluid may be physiologic. Endometrium is somewhat thickened if beta HCG value returns negative. Electronically Signed   By: Lowella Grip III M.D.   On: 10/15/2016 19:50   US Ob Transvaginal  Result Date: 10/15/2016 CLINICAL DATA:  Vaginal bleeding and cramping EXAM: OBSTETRIC <14 WK [place] AND TRANSVAGINAL OB US TECHNIQUE: Both transabdominal and transvaginal ultrasound examinations were performed for complete evaluation of the gestation as well as the maternal uterus, adnexal regions, and pelvic cul-de-sac. Transvaginal technique was performed to assess early pregnancy. COMPARISON:  None. FINDINGS: Intrauterine gestational sac: Not visualized Yolk sac:  Not visualized Embryo:  Not visualized Cardiac Activity: Not visualized Subchorionic hemorrhage:  None visualized. Maternal uterus/adnexae: Uterus measures  8.3 x 5.3 x 5.4 cm. No intrauterine mass. Endometrium measures 20 mm with a smooth contour. Left ovary measures 3.0 x 2.5 x 3.4 cm. Right ovary measures 3.2 x 2.4 x 3.1 cm. There are follicles in each ovary. Trace free pelvic fluid. IMPRESSION: No intrauterine gestation is evident. Currently beta HCG value is pending. Assuming positive beta HCG value, differential considerations for this finding include intrauterine gestation too early to be seen by either transabdominal or transvaginal technique, recent spontaneous abortion, or possible ectopic gestation. Given this circumstance. Close clinical and laboratory surveillance advised. Timing of repeat ultrasound in part will depend on beta HCG values going forward. Trace free pelvic fluid may be physiologic. Endometrium is somewhat thickened if beta HCG value returns negative. Electronically Signed   By: Lowella Grip III M.D.   On: 10/15/2016 19:50    MAU COURSE Reviewed [place] from yesterday Repeat BHCG today: appropriate rise SVE/SSE  Spoke with Dr. Cletis Media who agrees to have patient follow up with repeat BHCG and repeat US until pregnancy location is found, OK for discharge.  MDM Plan of care reviewed with patient, including labs and tests ordered and medical treatment. Reviewed with patient that it is too early for repeat US, but to return tomorrow to have US performed and repeat BHCG. VSS and no signs of active bleeding or significant pain on exam. Ultrasound from yesterday shows no sign of mass in adnexa. Discussed with patient may be too early in pregnancy to see gestation vs ectopic. Discussed return precautions for ectopic pregnancy or miscarriage.    ASSESSMENT 1. Vaginal bleeding in pregnancy, first trimester   2. Pregnancy of unknown anatomic location     PLAN Discharge home in stable condition. Follow up tomorrow for repeat BHCG and [place]  Signs/symptoms discussed with patient for return to ED immediately (ie. severe bleeding with  significant abdominal pain).   Allergies as of 10/16/2016      Reactions   Amoxicillin Nausea And Vomiting   Has patient had a PCN reaction causing immediate rash, facial/tongue/throat swelling, SOB or lightheadedness with hypotension: no Has patient  had a PCN reaction causing severe rash involving mucus membranes or skin necrosis: no Has patient had a PCN reaction that required hospitalization no Has patient had a PCN reaction occurring within the last 10 years: no If all of the above answers are "NO", then may proceed with Cephalosporin use.   Imitrex [sumatriptan]    C/o felt generalized flushing, burning, rhinorrhea within 2-3 min of medication      Medication List    TAKE these medications   prenatal multivitamin Tabs tablet Take 1 tablet by mouth daily at 12 noon.        Katherine Basset, DO OB Fellow 10/16/2016 4:39 PM

## 2016-10-17 ENCOUNTER — Inpatient Hospital Stay (HOSPITAL_COMMUNITY): Payer: Commercial Managed Care - HMO

## 2016-10-17 ENCOUNTER — Inpatient Hospital Stay (HOSPITAL_COMMUNITY)
Admission: AD | Admit: 2016-10-17 | Discharge: 2016-10-17 | Disposition: A | Discharge status: Home / Self Care | Payer: Commercial Managed Care - HMO | Source: Ambulatory Visit | Attending: Obstetrics and Gynecology | Admitting: Obstetrics and Gynecology

## 2016-10-17 DIAGNOSIS — R102 Pelvic and perineal pain: Secondary | ICD-10-CM | POA: Insufficient documentation

## 2016-10-17 DIAGNOSIS — N83202 Unspecified ovarian cyst, left side: Secondary | ICD-10-CM | POA: Diagnosis not present

## 2016-10-17 DIAGNOSIS — Z3A00 Weeks of gestation of pregnancy not specified: Secondary | ICD-10-CM | POA: Insufficient documentation

## 2016-10-17 DIAGNOSIS — O209 Hemorrhage in early pregnancy, unspecified: Secondary | ICD-10-CM | POA: Diagnosis not present

## 2016-10-17 DIAGNOSIS — O3680X0 Pregnancy with inconclusive fetal viability, not applicable or unspecified: Secondary | ICD-10-CM | POA: Diagnosis present

## 2016-10-17 DIAGNOSIS — N83201 Unspecified ovarian cyst, right side: Secondary | ICD-10-CM | POA: Diagnosis not present

## 2016-10-17 LAB — HCG, QUANTITATIVE, PREGNANCY: HCG, BETA CHAIN, QUANT, S: 4033 m[IU]/mL — AB (ref <5)

## 2016-10-17 NOTE — Discharge Instructions (Signed)
Ectopic Pregnancy °An ectopic pregnancy is when the fertilized egg attaches (implants) outside the uterus. Most ectopic pregnancies occur in one of the tubes where eggs travel from the ovary to the uterus (fallopian tubes), but the implanting can occur in other locations. In rare cases, ectopic pregnancies occur on the ovary, intestine, pelvis, abdomen, or cervix. In an ectopic pregnancy, the fertilized egg does not have the ability to develop into a normal, healthy baby. °A ruptured ectopic pregnancy is one in which tearing or bursting of a fallopian tube causes internal bleeding. Often, there is intense lower abdominal pain, and vaginal bleeding sometimes occurs. Having an ectopic pregnancy can be life-threatening. If this dangerous condition is not treated, it can lead to blood loss, shock, or even death. °What are the causes? °The most common cause of this condition is damage to one of the fallopian tubes. A fallopian tube may be narrowed or blocked, and that keeps the fertilized egg from reaching the uterus. °What increases the risk? °This condition is more likely to develop in women of childbearing age who have different levels of risk. The levels of risk can be divided into three categories. °High risk  °· You have gone through infertility treatment. °· You have had an ectopic pregnancy before. °· You have had surgery on the fallopian tubes, or another surgical procedure, such as an abortion. °· You have had surgery to have the fallopian tubes tied (tubal ligation). °· You have problems or diseases of the fallopian tubes. °· You have been exposed to diethylstilbestrol (DES). This medicine was used until 1971, and it had effects on babies whose mothers took the medicine. °· You become pregnant while using an IUD (intrauterine device) for birth control. °Moderate risk  °· You have a history of infertility. °· You have had an STI (sexually transmitted infection). °· You have a history of pelvic inflammatory  disease (PID). °· You have scarring from endometriosis. °· You have multiple sexual partners. °· You smoke. °Low risk  °· You have had pelvic surgery. °· You use vaginal douches. °· You became sexually active before age 18. °What are the signs or symptoms? °Common symptoms of this condition include normal pregnancy symptoms, such as missing a period, nausea, tiredness, abdominal pain, breast tenderness, and bleeding. However, ectopic pregnancy will have additional symptoms, such as: °· Pain with intercourse. °· Irregular vaginal bleeding or spotting. °· Cramping or pain on one side or in the lower abdomen. °· Fast heartbeat, low blood pressure, and sweating. °· Passing out while having a bowel movement. °Symptoms of a ruptured ectopic pregnancy and internal bleeding may include: °· Sudden, severe pain in the abdomen and pelvis. °· Dizziness, weakness, light-headedness, or fainting. °· Pain in the shoulder or neck area. °How is this diagnosed? °This condition is diagnosed by: °· A pelvic exam to locate pain or a mass in the abdomen. °· A pregnancy test. This blood test checks for the presence as well as the specific level of pregnancy hormone in the bloodstream. °· Ultrasound. This is performed if a pregnancy test is positive. In this test, a probe is inserted into the vagina. The probe will detect a fetus, possibly in a location other than the uterus. °· Taking a sample of uterus tissue (dilation and curettage, or D&C). °· Surgery to perform a visual exam of the inside of the abdomen using a thin, lighted tube that has a tiny camera on the end (laparoscope). °· Culdocentesis. This procedure involves inserting a needle at the   top of the vagina, behind the uterus. If blood is present in this area, it may indicate that a fallopian tube is torn. °How is this treated? °This condition is treated with medicine or surgery. °Medicine  °· An injection of a medicine (methotrexate) may be given to cause the pregnancy tissue to  be absorbed. This medicine may save your fallopian tube. It may be given if: °¨ The diagnosis is made early, with no signs of active bleeding. °¨ The fallopian tube has not ruptured. °¨ You are considered to be a good candidate for the medicine. °Usually, pregnancy hormone blood levels are checked after methotrexate treatment. This is to be sure that the medicine is effective. It may take 4-6 weeks for the pregnancy to be absorbed. Most pregnancies will be absorbed by 3 weeks. °Surgery  °· A laparoscope may be used to remove the pregnancy tissue. °· If severe internal bleeding occurs, a larger cut (incision) may be made in the lower abdomen (laparotomy) to remove the fetus and placenta. This is done to stop the bleeding. °· Part or all of the fallopian tube may be removed (salpingectomy) along with the fetus and placenta. The fallopian tube may also be repaired during the surgery. °· In very rare circumstances, removal of the uterus (hysterectomy) may be required. °· After surgery, pregnancy hormone testing may be done to be sure that there is no pregnancy tissue left. °Whether your treatment is medicine or surgery, you may receive a Rho (D) immune globulin shot to prevent problems with any future pregnancy. This shot may be given if: °· You are Rh-negative and the baby's father is Rh-positive. °· You are Rh-negative and you do not know the Rh type of the baby's father. °Follow these instructions at home: °· Rest and limit your activity after the procedure for as long as told by your health care provider. °· Until your health care provider says that it is safe: °¨ Do not lift anything that is heavier than 10 lb (4.5 kg), or the limit that your health care provider tells you. °¨ Avoid physical exercise and any movement that requires effort (is strenuous). °· To help prevent constipation: °¨ Eat a healthy diet that includes fruits, vegetables, and whole grains. °¨ Drink 6-8 glasses of water per day. °Get help right  away if: °· You develop worsening pain that is not relieved by medicine. °· You have: °¨ A fever or chills. °¨ Vaginal bleeding. °¨ Redness and swelling at the incision site. °¨ Nausea and vomiting. °· You feel dizzy or weak. °· You feel light-headed or you faint. °This information is not intended to replace advice given to you by your health care provider. Make sure you discuss any questions you have with your health care provider. °Document Released: 10/04/2004 Document Revised: 04/25/2016 Document Reviewed: 03/28/2016 °Elsevier Interactive Patient Education © 2017 [employer] ° °

## 2016-10-17 NOTE — MAU Note (Signed)
Pt here for follow up labs and ultrasound. No changes in pain or vag bleeding.

## 2016-10-17 NOTE — MAU Provider Note (Signed)
History     CSN: [id number]  Arrival date and time: 10/17/16 2119   None     Chief Complaint  Patient presents with  . Follow-up  . Vaginal Bleeding   Vaginal Bleeding  The patient's primary symptoms include pelvic pain and vaginal bleeding. This is a new problem. The current episode started in the past 7 days. The problem occurs constantly. The problem has been unchanged. She is pregnant. The vaginal discharge was bloody. The vaginal bleeding is lighter than menses. She has not been passing clots. She has not been passing tissue. Nothing aggravates the symptoms. She has tried nothing for the symptoms. Menstrual history: LMP 09/10/16    Past Medical History:  Diagnosis Date  . Depression   . H/O cold sores   . Medical history non-contributory     Past Surgical History:  Procedure Laterality Date  . DILATION AND CURETTAGE OF UTERUS    . FOOT SURGERY Left 2014 and 2015  . TONSILLECTOMY      Family History  Problem Relation Age of Onset  . Mitral valve prolapse Mother   . Heart disease Mother   . Diabetes Mother   . Healthy Father   . Stroke Maternal Grandmother   . Lung cancer Maternal Grandfather   . Migraines Paternal Grandmother     Social History  Substance Use Topics  . Smoking status: Never Smoker  . Smokeless tobacco: Never Used  . Alcohol use Yes     Comment: Socially    Allergies:  Allergies  Allergen Reactions  . Amoxicillin Nausea And Vomiting    Has patient had a PCN reaction causing immediate rash, facial/tongue/throat swelling, SOB or lightheadedness with hypotension: no Has patient had a PCN reaction causing severe rash involving mucus membranes or skin necrosis: no Has patient had a PCN reaction that required hospitalization no Has patient had a PCN reaction occurring within the last 10 years: no If all of the above answers are "NO", then may proceed with Cephalosporin use.   . Imitrex [Sumatriptan]     C/o felt generalized flushing, burning,  rhinorrhea within 2-3 min of medication    Prescriptions Prior to Admission  Medication Sig Dispense Refill Last Dose  . Prenatal Vit-Fe Fumarate-FA (PRENATAL MULTIVITAMIN) TABS tablet Take 1 tablet by mouth daily at 12 noon.   10/16/2016 at Unknown time    Review of Systems  Genitourinary: Positive for pelvic pain and vaginal bleeding.   Physical Exam   Blood pressure 127/83, pulse 94, temperature 98.9 F (37.2 C), temperature source Oral, resp. rate 16, last menstrual period 09/10/2016.  Physical Exam  Nursing note and vitals reviewed. Constitutional: She is oriented to person, place, and time. She appears well-developed and well-nourished. No distress.  HENT:  Head: Normocephalic.  Cardiovascular: Normal rate.   Respiratory: Effort normal.  Musculoskeletal: Normal range of motion.  Neurological: She is alert and oriented to person, place, and time.  Skin: Skin is warm and dry.  Psychiatric: She has a normal mood and affect.    Results for HALIA, RAY (MRN [id number]) as of 10/17/2016 22:37  Ref. Range 10/15/2016 19:05 10/15/2016 19:40 10/16/2016 16:39 10/17/2016 21:26  HCG, Beta Chain, Quant, S Latest Ref Range: <5 mIU/mL 2,068 (H)  2,430 (H) 4,033 (H)   Results for orders placed or performed during the hospital encounter of 10/17/16 (from the past 24 hour(s))  hCG, quantitative, pregnancy     Status: Abnormal   Collection Time: 10/17/16  9:26 PM  Result Value  Ref Range   hCG, Beta Chain, Quant, S 4,033 (H) <5 mIU/mL   US Ob Transvaginal  Result Date: 10/17/2016 CLINICAL DATA:  Spotting and cramping. Follow-up evaluation. Beta HCG 2,430. Gestational age by last menstrual period 5 weeks and 2 days. EXAM: TRANSVAGINAL OB ULTRASOUND TECHNIQUE: Transvaginal ultrasound was performed for complete evaluation of the gestation as well as the maternal uterus, adnexal regions, and pelvic cul-de-sac. COMPARISON:  Obstetric ultrasound October 15, 2016 FINDINGS: Intrauterine gestational sac: None.  Yolk sac:  None. Embryo:  None. Cardiac Activity: None. Subchorionic hemorrhage:  None visualized. Maternal uterus/adnexae: 18 mm LEFT and 1.7 cm RIGHT thick-walled cyst with peripheral vascularity, within the ovaries. No para ovarian mass. Small amount of free fluid in the pelvis. IMPRESSION: No sonographically identified pregnancy. This represents a pregnancy of unknown location. Please note, pregnancy may not be identified with beta HCG less than 3,000. Small bilateral ovarian cysts . Electronically Signed   By: Elon Alas M.D.   On: 10/17/2016 22:31   MAU Course  Procedures  MDM 2248: D/W Dr. Mancel Bale, she would like the patient to be seen in the office tomorrow. Office will call.   Assessment and Plan   1. Vaginal bleeding before [redacted] weeks gestation   2. Pregnancy, location unknown    DC home Comfort measures reviewed  1st Trimester precautions  Bleeding precautions Ectopic precautions RX: none  Return to MAU as needed FU with OB as planned  [facility] Follow up.   Specialty:  Obstetrics and Gynecology Why:  They will call you for an appoitment to be seen tomorrow. If you have not heard from the office by 9:30 please call them to verify appointment time.  Contact information: [address]. [address] [place] [phone]           Mathis Bud 10/17/2016, 10:38 PM

## 2016-10-26 ENCOUNTER — Ambulatory Visit (HOSPITAL_COMMUNITY): Payer: Commercial Managed Care - HMO | Admitting: Registered Nurse

## 2016-10-26 ENCOUNTER — Other Ambulatory Visit: Payer: Self-pay | Admitting: Obstetrics and Gynecology

## 2016-10-26 ENCOUNTER — Encounter (HOSPITAL_COMMUNITY)
Admission: AD | Disposition: A | Discharge status: Home / Self Care | Payer: Self-pay | Source: Ambulatory Visit | Attending: Obstetrics and Gynecology

## 2016-10-26 ENCOUNTER — Encounter (HOSPITAL_COMMUNITY): Payer: Self-pay

## 2016-10-26 ENCOUNTER — Ambulatory Visit (HOSPITAL_COMMUNITY)
Admission: AD | Admit: 2016-10-26 | Discharge: 2016-10-26 | Disposition: A | Discharge status: Home / Self Care | Payer: Commercial Managed Care - HMO | Source: Ambulatory Visit | Attending: Obstetrics and Gynecology | Admitting: Obstetrics and Gynecology

## 2016-10-26 DIAGNOSIS — Z881 Allergy status to other antibiotic agents status: Secondary | ICD-10-CM | POA: Insufficient documentation

## 2016-10-26 DIAGNOSIS — Z823 Family history of stroke: Secondary | ICD-10-CM | POA: Insufficient documentation

## 2016-10-26 DIAGNOSIS — R938 Abnormal findings on diagnostic imaging of other specified body structures: Secondary | ICD-10-CM | POA: Insufficient documentation

## 2016-10-26 DIAGNOSIS — Z888 Allergy status to other drugs, medicaments and biological substances status: Secondary | ICD-10-CM | POA: Insufficient documentation

## 2016-10-26 DIAGNOSIS — Z801 Family history of malignant neoplasm of trachea, bronchus and lung: Secondary | ICD-10-CM | POA: Diagnosis not present

## 2016-10-26 DIAGNOSIS — Z8249 Family history of ischemic heart disease and other diseases of the circulatory system: Secondary | ICD-10-CM | POA: Insufficient documentation

## 2016-10-26 DIAGNOSIS — Z820 Family history of epilepsy and other diseases of the nervous system: Secondary | ICD-10-CM | POA: Diagnosis not present

## 2016-10-26 DIAGNOSIS — O009 Unspecified ectopic pregnancy without intrauterine pregnancy: Secondary | ICD-10-CM | POA: Diagnosis present

## 2016-10-26 DIAGNOSIS — Z833 Family history of diabetes mellitus: Secondary | ICD-10-CM | POA: Diagnosis not present

## 2016-10-26 DIAGNOSIS — O00102 Left tubal pregnancy without intrauterine pregnancy: Secondary | ICD-10-CM | POA: Diagnosis not present

## 2016-10-26 HISTORY — PX: LAPAROSCOPY: SHX197

## 2016-10-26 HISTORY — PX: DILATION AND EVACUATION: SHX1459

## 2016-10-26 LAB — CBC
HEMATOCRIT: 33 % — AB (ref 36.0–46.0)
HEMOGLOBIN: 11.7 g/dL — AB (ref 12.0–15.0)
MCH: 31.3 pg (ref 26.0–34.0)
MCHC: 35.5 g/dL (ref 30.0–36.0)
MCV: 88.2 fL (ref 78.0–100.0)
Platelets: 261 10*3/uL (ref 150–400)
RBC: 3.74 MIL/uL — AB (ref 3.87–5.11)
RDW: 12.3 % (ref 11.5–15.5)
WBC: 11.4 10*3/uL — AB (ref 4.0–10.5)

## 2016-10-26 SURGERY — LAPAROSCOPY OPERATIVE
Anesthesia: General | Site: Vagina

## 2016-10-26 MED ORDER — FENTANYL CITRATE (PF) 100 MCG/2ML IJ SOLN
25.0000 ug | INTRAMUSCULAR | Status: DC | PRN
  Administered 2016-10-26 (×3): 50 ug via INTRAVENOUS

## 2016-10-26 MED ORDER — LIDOCAINE HCL (CARDIAC) 20 MG/ML IV SOLN
INTRAVENOUS | Status: AC
Start: 2016-10-26 — End: 2016-10-26
  Filled 2016-10-26: qty 5

## 2016-10-26 MED ORDER — FENTANYL CITRATE (PF) 100 MCG/2ML IJ SOLN
INTRAMUSCULAR | Status: AC
  Filled 2016-10-26: qty 2

## 2016-10-26 MED ORDER — BUPIVACAINE-EPINEPHRINE 0.25% -1:200000 IJ SOLN
INTRAMUSCULAR | Status: DC | PRN
  Administered 2016-10-26: 9 mL

## 2016-10-26 MED ORDER — LIDOCAINE HCL (CARDIAC) 20 MG/ML IV SOLN
INTRAVENOUS | Status: DC | PRN
  Administered 2016-10-26: 50 mg via INTRAVENOUS

## 2016-10-26 MED ORDER — MEPERIDINE HCL 25 MG/ML IJ SOLN: 6.2500 mg | INTRAMUSCULAR | Status: DC | PRN

## 2016-10-26 MED ORDER — DEXAMETHASONE SODIUM PHOSPHATE 10 MG/ML IJ SOLN
INTRAMUSCULAR | Status: DC | PRN
  Administered 2016-10-26: 4 mg via INTRAVENOUS

## 2016-10-26 MED ORDER — ONDANSETRON HCL 4 MG/2ML IJ SOLN: 4.0000 mg | Freq: Once | INTRAMUSCULAR | Status: DC | PRN

## 2016-10-26 MED ORDER — KETOROLAC TROMETHAMINE 30 MG/ML IJ SOLN
INTRAMUSCULAR | Status: AC
  Filled 2016-10-26: qty 1

## 2016-10-26 MED ORDER — ROCURONIUM BROMIDE 100 MG/10ML IV SOLN
INTRAVENOUS | Status: DC | PRN
  Administered 2016-10-26: 30 mg via INTRAVENOUS

## 2016-10-26 MED ORDER — MIDAZOLAM HCL 2 MG/2ML IJ SOLN
INTRAMUSCULAR | Status: DC | PRN
  Administered 2016-10-26: 2 mg via INTRAVENOUS

## 2016-10-26 MED ORDER — PROPOFOL 10 MG/ML IV BOLUS
INTRAVENOUS | Status: DC | PRN
  Administered 2016-10-26: 200 mg via INTRAVENOUS

## 2016-10-26 MED ORDER — OXYCODONE-ACETAMINOPHEN 5-325 MG PO TABS: ORAL_TABLET | ORAL | 0 refills | Status: DC

## 2016-10-26 MED ORDER — IBUPROFEN 600 MG PO TABS: ORAL_TABLET | ORAL | 1 refills | Status: DC

## 2016-10-26 MED ORDER — FENTANYL CITRATE (PF) 100 MCG/2ML IJ SOLN
INTRAMUSCULAR | Status: AC
  Administered 2016-10-26: 50 ug via INTRAVENOUS
  Filled 2016-10-26: qty 2

## 2016-10-26 MED ORDER — OXYCODONE-ACETAMINOPHEN 5-325 MG PO TABS
1.0000 | ORAL_TABLET | ORAL | Status: DC | PRN
  Administered 2016-10-26: 1 via ORAL

## 2016-10-26 MED ORDER — FENTANYL CITRATE (PF) 250 MCG/5ML IJ SOLN
INTRAMUSCULAR | Status: AC
  Filled 2016-10-26: qty 5

## 2016-10-26 MED ORDER — MIDAZOLAM HCL 2 MG/2ML IJ SOLN
INTRAMUSCULAR | Status: AC
  Filled 2016-10-26: qty 2

## 2016-10-26 MED ORDER — SUGAMMADEX SODIUM 200 MG/2ML IV SOLN
INTRAVENOUS | Status: DC | PRN
  Administered 2016-10-26: 200 mg via INTRAVENOUS

## 2016-10-26 MED ORDER — KETOROLAC TROMETHAMINE 30 MG/ML IJ SOLN
INTRAMUSCULAR | Status: DC | PRN
  Administered 2016-10-26: 30 mg via INTRAVENOUS

## 2016-10-26 MED ORDER — SCOPOLAMINE 1 MG/3DAYS TD PT72
MEDICATED_PATCH | TRANSDERMAL | Status: AC
  Filled 2016-10-26: qty 1

## 2016-10-26 MED ORDER — ONDANSETRON HCL 4 MG/2ML IJ SOLN
INTRAMUSCULAR | Status: AC
  Filled 2016-10-26: qty 2

## 2016-10-26 MED ORDER — LACTATED RINGERS IR SOLN
Status: DC | PRN
  Administered 2016-10-26: 3000 mL

## 2016-10-26 MED ORDER — ONDANSETRON HCL 4 MG/2ML IJ SOLN
INTRAMUSCULAR | Status: DC | PRN
  Administered 2016-10-26: 4 mg via INTRAVENOUS

## 2016-10-26 MED ORDER — SUCCINYLCHOLINE CHLORIDE 200 MG/10ML IV SOSY
PREFILLED_SYRINGE | INTRAVENOUS | Status: AC
  Filled 2016-10-26: qty 10

## 2016-10-26 MED ORDER — OXYCODONE-ACETAMINOPHEN 5-325 MG PO TABS
ORAL_TABLET | ORAL | Status: AC
  Filled 2016-10-26: qty 1

## 2016-10-26 MED ORDER — FENTANYL CITRATE (PF) 100 MCG/2ML IJ SOLN
INTRAMUSCULAR | Status: DC | PRN
  Administered 2016-10-26: 50 ug via INTRAVENOUS
  Administered 2016-10-26 (×3): 100 ug via INTRAVENOUS

## 2016-10-26 MED ORDER — DOXYCYCLINE HYCLATE 100 MG PO CAPS: 100.0000 mg | ORAL_CAPSULE | Freq: Two times a day (BID) | ORAL | 0 refills | Status: DC

## 2016-10-26 MED ORDER — KETOROLAC TROMETHAMINE 30 MG/ML IJ SOLN: 30.0000 mg | Freq: Once | INTRAMUSCULAR | Status: AC

## 2016-10-26 MED ORDER — LIDOCAINE HCL 2 % IJ SOLN
INTRAMUSCULAR | Status: AC
  Filled 2016-10-26: qty 20

## 2016-10-26 MED ORDER — BUPIVACAINE HCL (PF) 0.25 % IJ SOLN
INTRAMUSCULAR | Status: AC
  Filled 2016-10-26: qty 30

## 2016-10-26 MED ORDER — SCOPOLAMINE 1 MG/3DAYS TD PT72
1.0000 | MEDICATED_PATCH | Freq: Once | TRANSDERMAL | Status: DC
  Administered 2016-10-26: 1.5 mg via TRANSDERMAL

## 2016-10-26 MED ORDER — DEXAMETHASONE SODIUM PHOSPHATE 4 MG/ML IJ SOLN
INTRAMUSCULAR | Status: AC
  Filled 2016-10-26: qty 1

## 2016-10-26 MED ORDER — LIDOCAINE HCL (PF) 1 % IJ SOLN
INTRAMUSCULAR | Status: AC
  Filled 2016-10-26: qty 5

## 2016-10-26 MED ORDER — SUGAMMADEX SODIUM 200 MG/2ML IV SOLN
INTRAVENOUS | Status: AC
  Filled 2016-10-26: qty 2

## 2016-10-26 MED ORDER — LACTATED RINGERS IV SOLN
INTRAVENOUS | Status: DC
  Administered 2016-10-26: 18:00:00 via INTRAVENOUS
  Administered 2016-10-26: 125 mL/h via INTRAVENOUS

## 2016-10-26 MED ORDER — PROPOFOL 10 MG/ML IV BOLUS
INTRAVENOUS | Status: AC
  Filled 2016-10-26: qty 20

## 2016-10-26 MED ORDER — LIDOCAINE HCL 2 % IJ SOLN
INTRAMUSCULAR | Status: DC | PRN
  Administered 2016-10-26: 10 mL

## 2016-10-26 SURGICAL SUPPLY — 48 items
BARRIER ADHS 3X4 INTERCEED (GAUZE/BANDAGES/DRESSINGS) IMPLANT
CABLE HIGH FREQUENCY MONO STRZ (ELECTRODE) IMPLANT
CATH ROBINSON RED A/P 16FR (CATHETERS) ×4 IMPLANT
CLOTH BEACON ORANGE TIMEOUT ST (SAFETY) ×4 IMPLANT
DECANTER SPIKE VIAL GLASS SM (MISCELLANEOUS) ×4 IMPLANT
DERMABOND ADVANCED (GAUZE/BANDAGES/DRESSINGS) ×2
DERMABOND ADVANCED .7 DNX12 (GAUZE/BANDAGES/DRESSINGS) ×2 IMPLANT
DILATOR CANAL MILEX (MISCELLANEOUS) IMPLANT
DRSG OPSITE POSTOP 3X4 (GAUZE/BANDAGES/DRESSINGS) ×4 IMPLANT
DRSG VASELINE 3X18 (GAUZE/BANDAGES/DRESSINGS) IMPLANT
DURAPREP 26ML APPLICATOR (WOUND CARE) ×4 IMPLANT
FORCEPS CUTTING 33CM 5MM (CUTTING FORCEPS) ×4 IMPLANT
FORCEPS CUTTING 45CM 5MM (CUTTING FORCEPS) IMPLANT
GLOVE BIO SURGEON STRL SZ 6.5 (GLOVE) ×3 IMPLANT
GLOVE BIO SURGEONS STRL SZ 6.5 (GLOVE) ×1
GLOVE BIOGEL PI IND STRL 7.0 (GLOVE) ×6 IMPLANT
GLOVE BIOGEL PI INDICATOR 7.0 (GLOVE) ×6
GOWN STRL REUS W/TWL LRG LVL3 (GOWN DISPOSABLE) ×8 IMPLANT
KIT BERKELEY 1ST TRIMESTER 3/8 (MISCELLANEOUS) ×4 IMPLANT
MANIPULATOR UTERINE 4.5 ZUMI (MISCELLANEOUS) IMPLANT
NS IRRIG 1000ML POUR BTL (IV SOLUTION) ×4 IMPLANT
PACK LAPAROSCOPY BASIN (CUSTOM PROCEDURE TRAY) ×4 IMPLANT
PACK TRENDGUARD 450 HYBRID PRO (MISCELLANEOUS) ×2 IMPLANT
PACK VAGINAL MINOR WOMEN LF (CUSTOM PROCEDURE TRAY) ×4 IMPLANT
PAD OB MATERNITY 4.3X12.25 (PERSONAL CARE ITEMS) ×4 IMPLANT
PAD PREP 24X48 CUFFED NSTRL (MISCELLANEOUS) ×4 IMPLANT
POUCH SPECIMEN RETRIEVAL 10MM (ENDOMECHANICALS) ×4 IMPLANT
PROTECTOR NERVE ULNAR (MISCELLANEOUS) ×8 IMPLANT
SET BERKELEY SUCTION TUBING (SUCTIONS) ×4 IMPLANT
SET IRRIG TUBING LAPAROSCOPIC (IRRIGATION / IRRIGATOR) ×4 IMPLANT
SHEARS HARMONIC ACE PLUS 36CM (ENDOMECHANICALS) IMPLANT
SLEEVE XCEL OPT CAN 5 100 (ENDOMECHANICALS) ×8 IMPLANT
SOLUTION ELECTROLUBE (MISCELLANEOUS) IMPLANT
SUT MNCRL AB 3-0 PS2 27 (SUTURE) ×4 IMPLANT
SUT VICRYL 0 ENDOLOOP (SUTURE) IMPLANT
SUT VICRYL 0 UR6 27IN ABS (SUTURE) ×4 IMPLANT
SYR 20CC LL (SYRINGE) ×4 IMPLANT
TOWEL OR 17X24 6PK STRL BLUE (TOWEL DISPOSABLE) ×8 IMPLANT
TRAY FOLEY CATH SILVER 14FR (SET/KITS/TRAYS/PACK) ×4 IMPLANT
TRENDGUARD 450 HYBRID PRO PACK (MISCELLANEOUS) ×4
TROCAR BALLN 12MMX100 BLUNT (TROCAR) ×4 IMPLANT
TROCAR XCEL NON-BLD 5MMX100MML (ENDOMECHANICALS) ×8 IMPLANT
VACURETTE 10 RIGID CVD (CANNULA) IMPLANT
VACURETTE 7MM CVD STRL WRAP (CANNULA) ×4 IMPLANT
VACURETTE 8 RIGID CVD (CANNULA) IMPLANT
VACURETTE 9 RIGID CVD (CANNULA) IMPLANT
WARMER LAPAROSCOPE (MISCELLANEOUS) ×4 IMPLANT
WATER STERILE IRR 1000ML POUR (IV SOLUTION) ×4 IMPLANT

## 2016-10-26 NOTE — Anesthesia Procedure Notes (Signed)
Procedure Name: Intubation Date/Time: 10/26/2016 4:15 PM Performed by: Jonna Munro Pre-anesthesia Checklist: Patient identified, Emergency Drugs available, Suction available, Patient being monitored and Timeout performed Patient Re-evaluated:Patient Re-evaluated prior to inductionOxygen Delivery Method: Circle system utilized Preoxygenation: Pre-oxygenation with 100% oxygen Intubation Type: Cricoid Pressure applied and Rapid sequence Laryngoscope Size: Mac and 3 Grade View: Grade I Tube type: Oral Tube size: 7.0 mm Number of attempts: 1 Airway Equipment and Method: Stylet Placement Confirmation: ETT inserted through vocal cords under direct vision,  positive ETCO2 and breath sounds checked- equal and bilateral Secured at: 21 cm Tube secured with: Tape Dental Injury: Teeth and Oropharynx as per pre-operative assessment

## 2016-10-26 NOTE — Discharge Instructions (Signed)
DISCHARGE INSTRUCTIONS: Laparoscopy  The following instructions have been prepared to help you care for yourself upon your return home today.  Wound care:  Do not get the incision wet for the first 24 hours. The incision should be kept clean and dry.  The Band-Aids or dressings may be removed the day after surgery.  Should the incision become sore, red, and swollen after the first week, check with your doctor.  Personal hygiene:  Shower the day after your procedure.  Activity and limitations:  Do NOT drive or operate any equipment today.  Do NOT lift anything more than 15 pounds for 2-3 weeks after surgery.  Do NOT rest in bed all day.  Walking is encouraged. Walk each day, starting slowly with 5-minute walks 3 or 4 times a day. Slowly increase the length of your walks.  Walk up and down stairs slowly.  Do NOT do strenuous activities, such as golfing, playing tennis, bowling, running, biking, weight lifting, gardening, mowing, or vacuuming for 2-4 weeks. Ask your doctor when it is okay to start.  Diet: Eat a light meal as desired this evening. You may resume your usual diet tomorrow.  Return to work: This is dependent on the type of work you do. For the most part you can return to a desk job within a week of surgery. If you are more active at work, please discuss this with your doctor.  What to expect after your surgery: You may have a slight burning sensation when you urinate on the first day. You may have a very small amount of blood in the urine. Expect to have a small amount of vaginal discharge/light bleeding for 1-2 weeks. It is not unusual to have abdominal soreness and bruising for up to 2 weeks. You may be tired and need more rest for about 1 week. You may experience shoulder pain for 24-72 hours. Lying flat in bed may relieve it.  Call your doctor for any of the following:  Develop a fever of 100.4 or greater  Inability to urinate 6 hours after discharge from  hospital  Severe pain not relieved by pain medications  Persistent of heavy bleeding at incision site  Redness or swelling around incision site after a week  Increasing nausea or vomiting  Patient Signature________________________________________ Nurse Signature_________________________________________ Post Anesthesia Home Care Instructions  Activity: Get plenty of rest for the remainder of the day. A responsible adult should stay with you for 24 hours following the procedure.  For the next 24 hours, DO NOT: -Drive a car -Paediatric nurse -Drink alcoholic beverages -Take any medication unless instructed by your physician -Make any legal decisions or sign important papers.  Meals: Start with liquid foods such as gelatin or soup. Progress to regular foods as tolerated. Avoid greasy, spicy, heavy foods. If nausea and/or vomiting occur, drink only clear liquids until the nausea and/or vomiting subsides. Call your physician if vomiting continues.  Special Instructions/Symptoms: Your throat may feel dry or sore from the anesthesia or the breathing tube placed in your throat during surgery. If this causes discomfort, gargle with warm salt water. The discomfort should disappear within 24 hours.  If you had a scopolamine patch placed behind your ear for the management of post- operative nausea and/or vomiting:  1. The medication in the patch is effective for 72 hours, after which it should be removed.  Wrap patch in a tissue and discard in the trash. Wash hands thoroughly with soap and water. 2. You may remove the patch  earlier than 72 hours if you experience unpleasant side effects which may include dry mouth, dizziness or visual disturbances. 3. Avoid touching the patch. Wash your hands with soap and water after contact with the patch.   Call [facility] @ [phone] if:  You have a temperature greater than or equal to 100.4 degrees Farenheit orally You have pain that is  not made better by the pain medication given and taken as directed You have excessive bleeding or problems urinating  Take Colace (Docusate Sodium/Stool Softener) 100 mg 2-3 times daily while taking narcotic pain medicine to avoid constipation or until bowel movements are regular.  Take Ibuprofen 600 mg with food every 6 hours for 5 days then as needed for pain  You may drive after 36 hours as long as you're not taking narcotics You may shower tomorrow  You may resume a regular diet Keep incisions clean and dry  Avoid anything in vagina  until after your post-operative visit

## 2016-10-26 NOTE — Anesthesia Preprocedure Evaluation (Addendum)
Anesthesia Evaluation  Patient identified by MRN, date of birth, ID band Patient awake    Reviewed: Allergy & Precautions, NPO status , Patient's Chart, lab work & pertinent test results  Airway Mallampati: I       Dental no notable dental hx.    Pulmonary neg pulmonary ROS,    Pulmonary exam normal        Cardiovascular Normal cardiovascular exam     Neuro/Psych negative psych ROS   GI/Hepatic negative GI ROS, Neg liver ROS,   Endo/Other  negative endocrine ROS  Renal/GU negative Renal ROS     Musculoskeletal negative musculoskeletal ROS (+)   Abdominal Normal abdominal exam  (+)   Peds  Hematology negative hematology ROS (+)   Anesthesia Other Findings   Reproductive/Obstetrics negative OB ROS                             Anesthesia Physical Anesthesia Plan  ASA: II  Anesthesia Plan: General   Post-op Pain Management:    Induction: Intravenous, Rapid sequence and Cricoid pressure planned  Airway Management Planned: Oral ETT  Additional Equipment:   Intra-op Plan:   Post-operative Plan: Extubation in OR  Informed Consent: I have reviewed the patients History and Physical, chart, labs and discussed the procedure including the risks, benefits and alternatives for the proposed anesthesia with the patient or authorized representative who has indicated his/her understanding and acceptance.     Plan Discussed with: CRNA and Surgeon  Anesthesia Plan Comments:        Anesthesia Quick Evaluation

## 2016-10-26 NOTE — Transfer of Care (Signed)
Immediate Anesthesia Transfer of Care Note  Patient: Rebecca Benjamin  Procedure(s) Performed: Procedure(s) with comments: LAPAROSCOPY OPERATIVE (Left) - removal of ectopic pregnancy  DILATATION AND EVACUATION (N/A)  Patient Location: PACU  Anesthesia Type:General  Level of Consciousness: sedated  Airway & Oxygen Therapy: Patient Spontanous Breathing  Post-op Assessment: Report given to RN  Post vital signs: Reviewed and stable  Last Vitals:  Vitals:   10/26/16 1501  BP: 115/72  Pulse: 81  Resp: 16  Temp: 37.2 C    Last Pain:  Vitals:   10/26/16 1501  TempSrc: Oral  PainSc: 0-No pain      Patients Stated Pain Goal: 3 ([id number])  Complications: No apparent anesthesia complications

## 2016-10-26 NOTE — H&P (Signed)
Rebecca Benjamin is an 26 y.o. female. Who presents with ectopic seen on [place] today in the office.  She has some mild pain on the right side.    Pertinent Gynecological History: Menses: flow is moderate Bleeding: na Contraception: none DES exposure: denies Blood transfusions: none Sexually transmitted diseases: past history: of chlamydia Previous GYN Procedures: none  Last mammogram: na Date:  Last pap: normal Date: OB History: G1, P0   Menstrual History: Menarche age: 52 Patient's last menstrual period was 09/10/2016.    Past Medical History:  Diagnosis Date  . H/O cold sores   . Medical history non-contributory     Past Surgical History:  Procedure Laterality Date  . FOOT SURGERY Left 2014 and 2015  . TONSILLECTOMY      Family History  Problem Relation Age of Onset  . Mitral valve prolapse Mother   . Heart disease Mother   . Diabetes Mother   . Healthy Father   . Stroke Maternal Grandmother   . Lung cancer Maternal Grandfather   . Migraines Paternal Grandmother     Social History:  reports that she has never smoked. She has never used smokeless tobacco. She reports that she drinks alcohol. She reports that she does not use drugs.  Allergies:  Allergies  Allergen Reactions  . Amoxicillin Nausea And Vomiting    Has patient had a PCN reaction causing immediate rash, facial/tongue/throat swelling, SOB or lightheadedness with hypotension: no Has patient had a PCN reaction causing severe rash involving mucus membranes or skin necrosis: no Has patient had a PCN reaction that required hospitalization no Has patient had a PCN reaction occurring within the last 10 years: no If all of the above answers are "NO", then may proceed with Cephalosporin use.   . Imitrex [Sumatriptan]     C/o felt generalized flushing, burning, rhinorrhea within 2-3 min of medication    Prescriptions Prior to Admission  Medication Sig Dispense Refill Last Dose  . Prenatal Vit-Fe Fumarate-FA  (PRENATAL MULTIVITAMIN) TABS tablet Take 1 tablet by mouth daily at 12 noon.   10/26/2016 at 1000    ROS  Blood pressure 115/72, pulse 81, temperature 98.9 F (37.2 C), temperature source Oral, resp. rate 16, last menstrual period 09/10/2016, SpO2 100 %. Physical Exam  Physical Examination: General appearance - alert, well appearing, and in no distress Chest - clear to auscultation, no wheezes, rales or rhonchi, symmetric air entry Heart - normal rate and regular rhythm Abdomen - soft, nontender, nondistended, no masses or organomegaly Pelvic - normal external genitalia, vulva, vagina, cervix, uterus and adnexa Extremities - Homan's sign negative bilaterally  No results found for this or any previous visit (from the past 24 hour(s)).  No results found.  Assessment/Plan: Ectopic pregnancy with heart beat seen on [place] with questionable pseudosac or thickened endometrium.   Pt is stable and was given the options of MTX vs surgery.  R&B of both reviewed.  She desires to have surgery.  She understands she may have a salpingostomy, salpingectomy, mini laparotomy.  She understands the risks are bleeding, infection damage to internal organs such as bowel and bladder.    [place] A 10/26/2016, 3:32 PM

## 2016-10-26 NOTE — Op Note (Signed)
Pre op DX :  Pseudosac vs thickened endometrium  And ectopic pregnancy Postoperative Diagnosis: same Procedure:  dilation and evacuation, L/S and with left salpingectomy Anesthesia:  General Surgeon:  Dr. Charlesetta Garibaldi Asst : Earnstine Regal PA Complications : none Procedure in detail: The patient was taken to the operating room where she was given MAC anesthesia. She was placed in dorsal lithotomy position and prepped and draped in normal sterile fashion. In and out catheter was used to drain the bladder. This was examined and noted to have a 7 week size uterus with no adnexal masses. A bivalve was placed into the vagina. A tenaculum was placed on the cervix. The cervix was infiltrated with 10 cc 1% lidocaine paracervical block. The cervix then dilated with dilators up to 21.  A size 7 suction curettage was placed into the uterine cavity. A scant amount of products of conception was seen. The suction curettage was removed when a gritty texture was noted. A sharp curette was done along all walls  of the uterus. The suction curet was placed back into the uterine cavity.Marland Kitchen Sponge lap and needle counts were correct. Patient back in stable condition. The patient understood to be the risks to be, but not  limited to,  Bleeding,  Infection,  damage to internal organs by perforation of the uterus and Asherman syndrome (scarring in the uterus) leading to infertility.  Attention was then turned to the abdomen for the L/S  Diagnostic Laparoscopy Procedure Note    The acorn was attatched to the tenaculum and used as the uterine  Manipulator.  Foley cather was placed. A 2 cm umbilical incision was then performed. And carried to the fascia.  The fascia was incised and exteneded using mayo scissors.  Peritoneum was identified and entered sharply with the hemostat. Retractors were then placed into the abdomen. zero Vicryl suture was placed circumferentially around the fascia. The Sheryle Hail was placed and anchored by filling the  balloon.  Abdomen was insufflated with CO2 gas. after injection of the Marcaine mixture in the right and left lower quadrants  Two 5 mm ports were placed under direct visualization of the laparoscope.  200 cc of blood  was in the posterior cul-de-sac extending up into the pelvic brim.   The above findings were noted. A large left ectopic seen comprising most of fallopian tube.  Left ovary appeared normal. Right tube and ovary appeared normal. Uterus also look normal. omentum also were normal.  Left fallopian tube was removed using gyrus and bipolar cautery. Stasis was assured. Is some embryonic tissue or clots there were adherent to the posterior cul-de-sac and left ovary. These were removed with graspers. Irrigation was done of the entire abdomen and pelvis. Some bleeding in the posterior cul-de-sac where the tissue was removed. The foam was placed in this area. The 5 mm ports were removed under direct visualization.  Following the procedure the umbilical sheath was removed after intra-abdominal carbon dioxide was expressed. The fascia  was reapproximated by tying the 0 Vicryl circumferential suture. The incision was closed with subcutaneous and subcuticular sutures of 4-0 monocryl. The intrauterine manipulator was then removed.  Foley was removed  Instrument, sponge, and needle counts were correct prior to abdominal closure and at the conclusion of the case.

## 2016-10-28 ENCOUNTER — Encounter (HOSPITAL_COMMUNITY): Payer: Self-pay | Admitting: Obstetrics and Gynecology

## 2016-10-30 NOTE — Anesthesia Postprocedure Evaluation (Addendum)
Anesthesia Post Note  Patient: Rebecca Benjamin  Procedure(s) Performed: Procedure(s) (LRB): LAPAROSCOPY OPERATIVE (Left) DILATATION AND EVACUATION (N/A)  Patient location during evaluation: PACU Anesthesia Type: General Level of consciousness: awake and alert and patient cooperative Pain management: pain level controlled Vital Signs Assessment: post-procedure vital signs reviewed and stable Respiratory status: spontaneous breathing and respiratory function stable Cardiovascular status: stable Anesthetic complications: no        Last Vitals:  Vitals:   10/26/16 1845 10/26/16 1915  BP: 109/66 110/65  Pulse: 93 91  Resp: 19 16  Temp: 37.3 C 37.1 C    Last Pain:  Vitals:   10/29/16 1352  TempSrc:   PainSc: 4    Pain Goal: Patients Stated Pain Goal: 3 (10/26/16 1915)               [place]

## 2017-01-02 ENCOUNTER — Inpatient Hospital Stay (HOSPITAL_COMMUNITY)
Admission: AD | Admit: 2017-01-02 | Discharge: 2017-01-02 | Disposition: A | Discharge status: Home / Self Care | Payer: Commercial Managed Care - HMO | Source: Ambulatory Visit | Attending: Obstetrics & Gynecology | Admitting: Obstetrics & Gynecology

## 2017-01-02 ENCOUNTER — Encounter (HOSPITAL_COMMUNITY): Payer: Self-pay | Admitting: *Deleted

## 2017-01-02 ENCOUNTER — Inpatient Hospital Stay (HOSPITAL_COMMUNITY): Payer: Commercial Managed Care - HMO

## 2017-01-02 DIAGNOSIS — G44009 Cluster headache syndrome, unspecified, not intractable: Secondary | ICD-10-CM | POA: Insufficient documentation

## 2017-01-02 DIAGNOSIS — Z9079 Acquired absence of other genital organ(s): Secondary | ICD-10-CM | POA: Insufficient documentation

## 2017-01-02 DIAGNOSIS — N939 Abnormal uterine and vaginal bleeding, unspecified: Secondary | ICD-10-CM | POA: Insufficient documentation

## 2017-01-02 DIAGNOSIS — F419 Anxiety disorder, unspecified: Secondary | ICD-10-CM | POA: Insufficient documentation

## 2017-01-02 DIAGNOSIS — Z9889 Other specified postprocedural states: Secondary | ICD-10-CM | POA: Diagnosis not present

## 2017-01-02 DIAGNOSIS — B001 Herpesviral vesicular dermatitis: Secondary | ICD-10-CM | POA: Insufficient documentation

## 2017-01-02 DIAGNOSIS — R1031 Right lower quadrant pain: Secondary | ICD-10-CM

## 2017-01-02 DIAGNOSIS — Z87898 Personal history of other specified conditions: Secondary | ICD-10-CM | POA: Insufficient documentation

## 2017-01-02 DIAGNOSIS — O009 Unspecified ectopic pregnancy without intrauterine pregnancy: Secondary | ICD-10-CM | POA: Diagnosis not present

## 2017-01-02 DIAGNOSIS — N920 Excessive and frequent menstruation with regular cycle: Secondary | ICD-10-CM

## 2017-01-02 HISTORY — DX: Unspecified ectopic pregnancy without intrauterine pregnancy: O00.90

## 2017-01-02 LAB — URINALYSIS, ROUTINE W REFLEX MICROSCOPIC
Bacteria, UA: NONE SEEN
Bilirubin Urine: NEGATIVE
Glucose, UA: NEGATIVE mg/dL
KETONES UR: NEGATIVE mg/dL
Leukocytes, UA: NEGATIVE
Nitrite: NEGATIVE
PROTEIN: 30 mg/dL — AB
Specific Gravity, Urine: 1.026 (ref 1.005–1.030)
pH: 5 (ref 5.0–8.0)

## 2017-01-02 LAB — CBC WITH DIFFERENTIAL/PLATELET
Basophils Absolute: 0 10*3/uL (ref 0.0–0.1)
Basophils Relative: 0 %
EOS ABS: 0.1 10*3/uL (ref 0.0–0.7)
EOS PCT: 1 %
HCT: 38 % (ref 36.0–46.0)
Hemoglobin: 13 g/dL (ref 12.0–15.0)
LYMPHS ABS: 2.1 10*3/uL (ref 0.7–4.0)
Lymphocytes Relative: 43 %
MCH: 31.7 pg (ref 26.0–34.0)
MCHC: 34.2 g/dL (ref 30.0–36.0)
MCV: 92.7 fL (ref 78.0–100.0)
MONOS PCT: 3 %
Monocytes Absolute: 0.1 10*3/uL (ref 0.1–1.0)
Neutro Abs: 2.7 10*3/uL (ref 1.7–7.7)
Neutrophils Relative %: 53 %
PLATELETS: 226 10*3/uL (ref 150–400)
RBC: 4.1 MIL/uL (ref 3.87–5.11)
RDW: 12.2 % (ref 11.5–15.5)
WBC: 5 10*3/uL (ref 4.0–10.5)

## 2017-01-02 LAB — TYPE AND SCREEN
ABO/RH(D): A POS
Antibody Screen: NEGATIVE

## 2017-01-02 LAB — HCG, QUANTITATIVE, PREGNANCY: hCG, Beta Chain, Quant, S: <1 m[IU]/mL (ref <5)

## 2017-01-02 MED ORDER — LACTATED RINGERS IV SOLN
INTRAVENOUS | Status: DC
  Administered 2017-01-02: 15:00:00 via INTRAVENOUS

## 2017-01-02 MED ORDER — TRANEXAMIC ACID 650 MG PO TABS: 1300.0000 mg | ORAL_TABLET | Freq: Three times a day (TID) | ORAL | 0 refills | Status: AC

## 2017-01-02 MED ORDER — KETOROLAC TROMETHAMINE 30 MG/ML IJ SOLN
30.0000 mg | Freq: Once | INTRAMUSCULAR | Status: AC
  Administered 2017-01-02: 30 mg via INTRAVENOUS
  Filled 2017-01-02: qty 1

## 2017-01-02 MED ORDER — LACTATED RINGERS IV SOLN: INTRAVENOUS | Status: DC

## 2017-01-02 MED ORDER — TRANEXAMIC ACID 650 MG PO TABS
1300.0000 mg | ORAL_TABLET | Freq: Once | ORAL | Status: AC
  Administered 2017-01-02: 1300 mg via ORAL
  Filled 2017-01-02: qty 2

## 2017-01-02 NOTE — Discharge Instructions (Signed)

## 2017-01-02 NOTE — MAU Note (Addendum)
Pt had surgery for ectopic on Feb 16 , had her first period after surgery the end of March 29, lasted 7-8 days.  Started cramping three days ago,  Noted brown mucus discharge yesterday morning. Started bleeding heavily around 1700 yesterday.  Woke up this morning bleeding excessively, soaked her clothes up to her breasts, had soaked her mattress pad. Pt had neg HPT

## 2017-01-02 NOTE — MAU Provider Note (Signed)
Rebecca Benjamin,Rebecca Benjamin  History   26 yo G2P0 presented after calling the office to report heavy bleeding since last night.  Hx of left salpingectomy due to ectopic 10/26/16, with D&E.  Seen 11/08/16 for bleeding episode that lasted several days, with normal findings, started on doxycycline course, with H/H WNL.  Had first cycle s/p procedure 3/29-4/4, with heavy flow and cramping.  Not using any birth control other than withdrawal.  Seen 3/9 by Dr. Charlesetta Garibaldi for post-op check, Rebecca Benjamin/o some dizziness and light-headedness, but overall doing well physically.  She does report significant grieving, and is seeing counselor--feels she is doing much better now.  Brought pictures of the bleeding she had in the last 24 hours--soaked her clothes, soaked her bed to the mattress during the night.  Reports cramping, worse on right than left.  Denies N/V, dysuria, syncope, but does feel "kind of out of it".  Very concerned that she be completely assessed in this situation--had questions about the processes of care during her experience of the ectopic.  Patient Active Problem List   Diagnosis Date Noted  . Ectopic pregnancy 10/2016 01/02/2017  . Cluster headache, not intractable 01/11/2016  . 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 Vaginal Bleeding   HPI:  See above  OB History    Gravida Para Term Preterm AB Living   2       2 0   SAB TAB Ectopic Multiple Live Births     1 1        TAB in past 10/2016--left ectopic pregnancy, with salpingectomy and D&E  Past Medical History:  Diagnosis Date  . Ectopic pregnancy   . H/O cold sores   . Medical history non-contributory     Past Surgical History:  Procedure Laterality Date  . DILATION AND EVACUATION N/A 10/26/2016   Procedure: DILATATION AND EVACUATION;  Surgeon: Crawford Givens, MD;  Location: [facility];  Service: Gynecology;  Laterality: N/A;  . FOOT SURGERY Left 2014 and 2015  . LAPAROSCOPY Left 10/26/2016   Procedure: LAPAROSCOPY OPERATIVE;  Surgeon: Crawford Givens, MD;  Location: [facility];  Service: Gynecology;  Laterality: Left;  removal of ectopic pregnancy   . TONSILLECTOMY      Family History  Problem Relation Age of Onset  . Mitral valve prolapse Mother   . Heart disease Mother   . Diabetes Mother   . Healthy Father   . Stroke Maternal Grandmother   . Lung cancer Maternal Grandfather   . Migraines Paternal Grandmother     Social History  Substance Use Topics  . Smoking status: Never Smoker  . Smokeless tobacco: Never Used  . Alcohol use Yes     Comment: Socially    Allergies:  Allergies  Allergen Reactions  . Amoxicillin Nausea And Vomiting    Has patient had a PCN reaction causing immediate rash, facial/tongue/throat swelling, SOB or lightheadedness with hypotension: no Has patient had a PCN reaction causing severe rash involving mucus membranes or skin necrosis: no Has patient had a PCN reaction that required hospitalization no Has patient had a PCN reaction occurring within the last 10 years: no If all of the above answers are "NO", then may proceed with Cephalosporin use.   . Imitrex [Sumatriptan]     Rebecca Benjamin/o felt generalized flushing, burning, rhinorrhea within 2-3 min of medication    Prescriptions Prior to Admission  Medication Sig Dispense Refill Last Dose  . doxycycline (VIBRAMYCIN) 100 MG  capsule Take 1 capsule (100 mg total) by mouth 2 (two) times daily. 14 capsule 0   . ibuprofen (ADVIL,MOTRIN) 600 MG tablet 1 po  pc   every 6 hours for 5 days then as needed for pain 30 tablet 1   . oxyCODONE-acetaminophen (PERCOCET/ROXICET) 5-325 MG tablet 1-2 tablets po every 6 hours prn-post operative pain 16 tablet 0   . Prenatal Vit-Fe Fumarate-FA (PRENATAL MULTIVITAMIN) TABS tablet Take 1 tablet by mouth daily at 12 noon.   10/26/2016 at 1000    ROS:  Vaginal bleeding, cramping, RLQ pain Physical Exam   Physical Exam Alert, oriented, appears anxious Chest clear Heart  RRR without murmur Abd soft, NT, no rebound or guarding Pelvic--small amount dark blood in vault, cervix closed, NT.  Uterus small, mild tenderness. Right adnexal area tender to palpation, no masses palpated.  Left adnexal area NT, no masses Ext WNL   ED Course  Assessment: Hx left ectopic with salpingectomy 10/26/16 Vaginal bleeding Anxiety  Plan: CBC/diff, QHCG, T&S Pelvic US IV hydration Toradol 30 mg IV UA Long discussion about her prior experience and current status, with questions answered and support offered. Will consult with Dr. Alesia Richards after results obtained.   Donnel Saxon CNM, MSN 01/02/2017 3:55 PM   Addendum: Returned from [place]:   FINDINGS: Uterus Measurements: 8.6 x 4.4 x 5.1 cm. No fibroids or other mass visualized. Endometrium Thickness: 5 mm in thickness.  No focal abnormality visualized. Right ovary Measurements: 2.9 x 1.6 x 4.1 cm. Dominant follicle measures up to 2.9 cm. Normal appearance/no adnexal mass. Left ovary Measurements: 2.8 x 2.0 x 2.3 cm. Normal appearance/no adnexal mass. Other findings No abnormal free fluid. IMPRESSION: Unremarkable pelvic ultrasound.  Results for orders placed or performed during the hospital encounter of 01/02/17 (from the past 24 hour(s))  Urinalysis, Routine w reflex microscopic     Status: Abnormal   Collection Time: 01/02/17  2:29 PM  Result Value Ref Range   Color, Urine YELLOW YELLOW   APPearance HAZY (A) CLEAR   Specific Gravity, Urine 1.026 1.005 - 1.030   pH 5.0 5.0 - 8.0   Glucose, UA NEGATIVE NEGATIVE mg/dL   Hgb urine dipstick LARGE (A) NEGATIVE   Bilirubin Urine NEGATIVE NEGATIVE   Ketones, ur NEGATIVE NEGATIVE mg/dL   Protein, ur 30 (A) NEGATIVE mg/dL   Nitrite NEGATIVE NEGATIVE   Leukocytes, UA NEGATIVE NEGATIVE   RBC / HPF TOO NUMEROUS TO COUNT 0 - 5 RBC/hpf   WBC, UA 0-5 0 - 5 WBC/hpf   Bacteria, UA NONE SEEN NONE SEEN   Squamous Epithelial / LPF 0-5 (A) NONE SEEN   Mucous  PRESENT   CBC with Differential/Platelet     Status: None   Collection Time: 01/02/17  2:30 PM  Result Value Ref Range   WBC 5.0 4.0 - 10.5 K/uL   RBC 4.10 3.87 - 5.11 MIL/uL   Hemoglobin 13.0 12.0 - 15.0 g/dL   HCT 38.0 36.0 - 46.0 %   MCV 92.7 78.0 - 100.0 fL   MCH 31.7 26.0 - 34.0 pg   MCHC 34.2 30.0 - 36.0 g/dL   RDW 12.2 11.5 - 15.5 %   Platelets 226 150 - 400 K/uL   Neutrophils Relative % 53 %   Neutro Abs 2.7 1.7 - 7.7 K/uL   Lymphocytes Relative 43 %   Lymphs Abs 2.1 0.7 - 4.0 K/uL   Monocytes Relative 3 %   Monocytes Absolute 0.1 0.1 - 1.0 K/uL   Eosinophils  Relative 1 %   Eosinophils Absolute 0.1 0.0 - 0.7 K/uL   Basophils Relative 0 %   Basophils Absolute 0.0 0.0 - 0.1 K/uL  Type and screen [place]     Status: None   Collection Time: 01/02/17  2:30 PM  Result Value Ref Range   ABO/RH(D) A POS    Antibody Screen NEG    Sample Expiration 01/05/2017   hCG, quantitative, pregnancy     Status: None   Collection Time: 01/02/17  2:30 PM  Result Value Ref Range   hCG, Beta Chain, Quant, S <1 <5 mIU/mL    Vitals:   01/02/17 1440 01/02/17 1445 01/02/17 1506 01/02/17 1821  BP: 118/86  111/77 102/68  Pulse: 88  71 84  Resp:    18  Temp:  98.8 F (37.1 Rebecca Benjamin)    TempSrc:  Oral     Bleeding minimal at present, and minimal cramping. VS stable  Dx: Menorrhagia, 2nd cycle s/p ectopic.  Plan: Consulted with Dr. Lewie Chamber recommended. Reviewed recommendation with patient--she initially declined ("don't want to do anything that will affect my fertility").  Issues reviewed, with options for observation, Motrin course, or Lysteda reviewed. Patient elected to take initial dose of Lysteda 1300 mg in MAU, and took written Rx for same--1300 mg po TID x 5 days. OOW today and note given for tomorrow if needed. Patient plans to f/u with Dr. Charlesetta Garibaldi for [place] discussed at her last visit with [place]. Patient to observe cycles and call prn. Support to patient  for her concerns about fertility.  Donnel Saxon, CNM 01/02/17 1750

## 2017-01-10 DIAGNOSIS — Z9079 Acquired absence of other genital organ(s): Secondary | ICD-10-CM

## 2017-03-28 ENCOUNTER — Other Ambulatory Visit (HOSPITAL_COMMUNITY): Payer: Self-pay | Admitting: Obstetrics and Gynecology

## 2017-03-28 DIAGNOSIS — N979 Female infertility, unspecified: Secondary | ICD-10-CM

## 2017-04-02 ENCOUNTER — Encounter (HOSPITAL_COMMUNITY): Payer: Self-pay | Admitting: Radiology

## 2017-04-02 ENCOUNTER — Ambulatory Visit (HOSPITAL_COMMUNITY)
Admission: RE | Admit: 2017-04-02 | Discharge: 2017-04-02 | Disposition: A | Discharge status: Home / Self Care | Payer: 59 | Source: Ambulatory Visit | Attending: Obstetrics and Gynecology | Admitting: Obstetrics and Gynecology

## 2017-04-02 DIAGNOSIS — Z9079 Acquired absence of other genital organ(s): Secondary | ICD-10-CM | POA: Insufficient documentation

## 2017-04-02 DIAGNOSIS — N971 Female infertility of tubal origin: Secondary | ICD-10-CM | POA: Insufficient documentation

## 2017-04-02 DIAGNOSIS — N979 Female infertility, unspecified: Secondary | ICD-10-CM

## 2017-04-02 MED ORDER — IOPAMIDOL (ISOVUE-300) INJECTION 61%
30.0000 mL | Freq: Once | INTRAVENOUS | Status: AC | PRN
  Administered 2017-04-02: 9 mL

## 2017-04-17 ENCOUNTER — Ambulatory Visit: Payer: Self-pay

## 2017-04-17 ENCOUNTER — Other Ambulatory Visit: Payer: Self-pay | Admitting: Occupational Medicine

## 2017-04-17 DIAGNOSIS — M79641 Pain in right hand: Secondary | ICD-10-CM

## 2017-05-01 NOTE — Addendum Note (Signed)
Addendum  created 05/01/17 1155 by Albertha Ghee, MD   Sign clinical note

## 2017-05-21 ENCOUNTER — Inpatient Hospital Stay (HOSPITAL_COMMUNITY)
Admission: AD | Admit: 2017-05-21 | Discharge: 2017-05-21 | Disposition: A | Discharge status: Home / Self Care | Payer: 59 | Source: Ambulatory Visit | Attending: Obstetrics and Gynecology | Admitting: Obstetrics and Gynecology

## 2017-05-21 ENCOUNTER — Encounter (HOSPITAL_COMMUNITY): Payer: Self-pay | Admitting: *Deleted

## 2017-05-21 ENCOUNTER — Inpatient Hospital Stay (HOSPITAL_COMMUNITY): Payer: 59

## 2017-05-21 DIAGNOSIS — R109 Unspecified abdominal pain: Secondary | ICD-10-CM | POA: Diagnosis not present

## 2017-05-21 DIAGNOSIS — O26891 Other specified pregnancy related conditions, first trimester: Secondary | ICD-10-CM | POA: Insufficient documentation

## 2017-05-21 DIAGNOSIS — O26899 Other specified pregnancy related conditions, unspecified trimester: Secondary | ICD-10-CM

## 2017-05-21 DIAGNOSIS — O3680X0 Pregnancy with inconclusive fetal viability, not applicable or unspecified: Secondary | ICD-10-CM

## 2017-05-21 DIAGNOSIS — Z3A01 Less than 8 weeks gestation of pregnancy: Secondary | ICD-10-CM | POA: Diagnosis not present

## 2017-05-21 DIAGNOSIS — O9989 Other specified diseases and conditions complicating pregnancy, childbirth and the puerperium: Secondary | ICD-10-CM | POA: Diagnosis not present

## 2017-05-21 LAB — URINALYSIS, ROUTINE W REFLEX MICROSCOPIC
Bilirubin Urine: NEGATIVE
GLUCOSE, UA: NEGATIVE mg/dL
Hgb urine dipstick: NEGATIVE
KETONES UR: NEGATIVE mg/dL
LEUKOCYTES UA: NEGATIVE
NITRITE: NEGATIVE
PH: 6 (ref 5.0–8.0)
PROTEIN: NEGATIVE mg/dL
Specific Gravity, Urine: 1.018 (ref 1.005–1.030)

## 2017-05-21 LAB — CBC
HCT: 36.7 % (ref 36.0–46.0)
HEMOGLOBIN: 12.8 g/dL (ref 12.0–15.0)
MCH: 31.6 pg (ref 26.0–34.0)
MCHC: 34.9 g/dL (ref 30.0–36.0)
MCV: 90.6 fL (ref 78.0–100.0)
PLATELETS: 231 10*3/uL (ref 150–400)
RBC: 4.05 MIL/uL (ref 3.87–5.11)
RDW: 12.4 % (ref 11.5–15.5)
WBC: 8.2 10*3/uL (ref 4.0–10.5)

## 2017-05-21 LAB — POCT PREGNANCY, URINE: Preg Test, Ur: POSITIVE — AB

## 2017-05-21 LAB — HCG, QUANTITATIVE, PREGNANCY: HCG, BETA CHAIN, QUANT, S: 280 m[IU]/mL — AB (ref <5)

## 2017-05-21 NOTE — MAU Note (Signed)
Hx of left ectopic, with emergency surgery.  Is preg, doesn't feel like last time.  Today is the first day she has had any pain.  Sharp pains in lower abd, cramping like period cramps.  Had some brown d/c.

## 2017-05-21 NOTE — Discharge Instructions (Signed)
Return to care   If you have heavier bleeding that soaks through more that 2 pads per hour for an hour or more  If you bleed so much that you feel like you might pass out or you do pass out  If you have significant abdominal pain that is not improved with Tylenol   If you develop a fever > 100.5   Abdominal Pain During Pregnancy Abdominal pain is common in pregnancy. Most of the time, it does not cause harm. There are many causes of abdominal pain. Some causes are more serious than others and sometimes the cause is not known. Abdominal pain can be a sign that something is very wrong with the pregnancy or the pain may have nothing to do with the pregnancy. Always tell your health care provider if you have any abdominal pain. Follow these instructions at home:  Do not have sex or put anything in your vagina until your symptoms go away completely.  Watch your abdominal pain for any changes.  Get plenty of rest until your pain improves.  Drink enough fluid to keep your urine clear or pale yellow.  Take over-the-counter or prescription medicines only as told by your health care provider.  Keep all follow-up visits as told by your health care provider. This is important. Contact a health care provider if:  You have a fever.  Your pain gets worse or you have cramping.  Your pain continues after resting. Get help right away if:  You are bleeding, leaking fluid, or passing tissue from the vagina.  You have vomiting or diarrhea that does not go away.  You have painful or bloody urination.  You notice a decrease in your baby's movements.  You feel very weak or faint.  You have shortness of breath.  You develop a severe headache with abdominal pain.  You have abnormal vaginal discharge with abdominal pain. This information is not intended to replace advice given to you by your health care provider. Make sure you discuss any questions you have with your health care  provider. Document Released: 08/27/2005 Document Revised: 06/07/2016 Document Reviewed: 03/26/2013 Elsevier Interactive Patient Education  [employer].

## 2017-05-21 NOTE — MAU Provider Note (Signed)
History     CSN: [id number]  Arrival date and time: 05/21/17 1434  First Provider Initiated Contact with Patient 05/21/17 1520      Chief Complaint  Patient presents with  . Abdominal Pain   HPI Rebecca Benjamin is a 26 y.o. G3P0020 at [redacted]w[redacted]d by LMP who presents with abdominal pain. History significant for ectopic pregnancy in February of this year; laparoscopic surgery to remove ectopic, was told by MD that left tube was removed.  Was seen in [place] office yesterday; had HCG drawn but hasn't been told results yet. Had been having lower abdominal cramping but reports intermittent sharp pains that started today. Called the office & was told to come to MAU.  Lower abdominal pain is sharp & intermittent, alternates sides, and lasts for seconds at a time. Rates pain 4/10. Denies n/v/d, vaginal bleeding, dysuria.   OB History    Gravida Para Term Preterm AB Living   3       2 0   SAB TAB Ectopic Multiple Live Births     1 1          Past Medical History:  Diagnosis Date  . Ectopic pregnancy   . H/O cold sores     Past Surgical History:  Procedure Laterality Date  . DILATION AND EVACUATION N/A 10/26/2016   Procedure: DILATATION AND EVACUATION;  Surgeon: Crawford Givens, MD;  Location: [facility];  Service: Gynecology;  Laterality: N/A;  . FOOT SURGERY Left 2014 and 2015  . LAPAROSCOPY Left 10/26/2016   Procedure: LAPAROSCOPY OPERATIVE;  Surgeon: Crawford Givens, MD;  Location: [facility];  Service: Gynecology;  Laterality: Left;  removal of ectopic pregnancy   . TONSILLECTOMY      Family History  Problem Relation Age of Onset  . Mitral valve prolapse Mother   . Heart disease Mother   . Diabetes Mother   . Healthy Father   . Stroke Maternal Grandmother   . Lung cancer Maternal Grandfather   . Migraines Paternal Grandmother     Social History  Substance Use Topics  . Smoking status: Never Smoker  . Smokeless tobacco: Never Used  . Alcohol use Yes     Comment: Socially    Allergies:   Allergies  Allergen Reactions  . Amoxicillin Nausea And Vomiting    Has patient had a PCN reaction causing immediate rash, facial/tongue/throat swelling, SOB or lightheadedness with hypotension: no Has patient had a PCN reaction causing severe rash involving mucus membranes or skin necrosis: no Has patient had a PCN reaction that required hospitalization no Has patient had a PCN reaction occurring within the last 10 years: no If all of the above answers are "NO", then may proceed with Cephalosporin use.   . Imitrex [Sumatriptan]     C/o felt generalized flushing, burning, rhinorrhea within 2-3 min of medication    No prescriptions prior to admission.    Review of Systems  Constitutional: Negative.   Gastrointestinal: Positive for abdominal pain. Negative for constipation, diarrhea, nausea and vomiting.  Genitourinary: Negative.    Physical Exam   Blood pressure 116/78, pulse 89, temperature 98.5 F (36.9 C), temperature source Oral, resp. rate 16, weight 139 lb 12 oz (63.4 kg), last menstrual period 04/21/2017, SpO2 100 %, unknown if currently breastfeeding.  Physical Exam  Nursing note and vitals reviewed. Constitutional: She is oriented to person, place, and time. She appears well-developed and well-nourished. No distress.  HENT:  Head: Normocephalic and atraumatic.  Eyes: Conjunctivae are normal. Right  eye exhibits no discharge. Left eye exhibits no discharge. No scleral icterus.  Neck: Normal range of motion.  Cardiovascular: Normal rate, regular rhythm and normal heart sounds.   No murmur heard. Respiratory: Effort normal and breath sounds normal. No respiratory distress. She has no wheezes.  GI: Soft. Bowel sounds are normal. She exhibits no distension. There is no tenderness. There is no rebound and no guarding.  Genitourinary:  Genitourinary Comments: Pt declines pelvic/bimanual exam  Neurological: She is alert and oriented to person, place, and time.  Skin: Skin is  warm and dry. She is not diaphoretic.  Psychiatric: She has a normal mood and affect. Her behavior is normal. Judgment and thought content normal.    MAU Course  Procedures Results for orders placed or performed during the hospital encounter of 05/21/17 (from the past 48 hour(s))  Urinalysis, Routine w reflex microscopic     Status: Abnormal   Collection Time: 05/21/17  2:51 PM  Result Value Ref Range   Color, Urine YELLOW YELLOW   APPearance HAZY (A) CLEAR   Specific Gravity, Urine 1.018 1.005 - 1.030   pH 6.0 5.0 - 8.0   Glucose, UA NEGATIVE NEGATIVE mg/dL   Hgb urine dipstick NEGATIVE NEGATIVE   Bilirubin Urine NEGATIVE NEGATIVE   Ketones, ur NEGATIVE NEGATIVE mg/dL   Protein, ur NEGATIVE NEGATIVE mg/dL   Nitrite NEGATIVE NEGATIVE   Leukocytes, UA NEGATIVE NEGATIVE  Pregnancy, urine POC     Status: Abnormal   Collection Time: 05/21/17  3:11 PM  Result Value Ref Range   Preg Test, Ur POSITIVE (A) NEGATIVE    Comment:        THE SENSITIVITY OF THIS METHODOLOGY IS >24 mIU/mL   CBC     Status: None   Collection Time: 05/21/17  3:28 PM  Result Value Ref Range   WBC 8.2 4.0 - 10.5 K/uL   RBC 4.05 3.87 - 5.11 MIL/uL   Hemoglobin 12.8 12.0 - 15.0 g/dL   HCT 36.7 36.0 - 46.0 %   MCV 90.6 78.0 - 100.0 fL   MCH 31.6 26.0 - 34.0 pg   MCHC 34.9 30.0 - 36.0 g/dL   RDW 12.4 11.5 - 15.5 %   Platelets 231 150 - 400 K/uL  hCG, quantitative, pregnancy     Status: Abnormal   Collection Time: 05/21/17  3:28 PM  Result Value Ref Range   hCG, Beta Chain, Quant, S 280 (H) <5 mIU/mL    Comment:          GEST. AGE      CONC.  (mIU/mL)   <=1 WEEK        5 - 50     2 WEEKS       50 - 500     3 WEEKS       100 - 10,000     4 WEEKS     1,000 - 30,000     5 WEEKS     3,500 - 115,000   6-8 WEEKS     12,000 - 270,000    12 WEEKS     15,000 - 220,000        FEMALE AND NON-PREGNANT FEMALE:     LESS THAN 5 mIU/mL    US Ob Comp Less 14 Wks  Result Date: 05/21/2017 CLINICAL DATA:  Abdominal  pain affecting pregnancy ; history of ectopic pregnancy in February 2018; quantitative beta HCG = 280 EXAM: OBSTETRIC <14 WK [place] AND TRANSVAGINAL OB US TECHNIQUE: Both  transabdominal and transvaginal ultrasound examinations were performed for complete evaluation of the gestation as well as the maternal uterus, adnexal regions, and pelvic cul-de-sac. Transvaginal technique was performed to assess early pregnancy. COMPARISON:  01/02/2017 pelvic ultrasound FINDINGS: Intrauterine gestational sac: Tiny focal fluid collection within endometrium versus myometrium at mid uterine segment images 15 and 41, nonspecific. No other potential gestational sac. Yolk sac:  N/A Embryo:  N/A Cardiac Activity: N/A Heart Rate: N/A  bpm Subchorionic hemorrhage:  N/A Maternal uterus/adnexae: Significant thickening of the endometrial complex. Ovaries normal in appearance bilaterally. Small amount of nonspecific free pelvic fluid. No adnexal masses. IMPRESSION: Significant thickening of the endometrial complex. Questionable tiny fluid collection 5 mm greatest diameter at the mid uterine segment, uncertain if endometrial or myometrial, nonspecific. Uncertain whether this represents a small amount of fluid or potentially a tiny gestational sac. In the absence of a definite intrauterine gestational sac, ectopic pregnancy is not excluded. Followup ultrasound recommended in 14 days to reassess and definitively exclude ectopic pregnancy. Electronically Signed   By: Lavonia Dana M.D.   On: 05/21/2017 16:59   US Ob Transvaginal  Result Date: 05/21/2017 CLINICAL DATA:  Abdominal pain affecting pregnancy ; history of ectopic pregnancy in February 2018; quantitative beta HCG = 280 EXAM: OBSTETRIC <14 WK [place] AND TRANSVAGINAL OB US TECHNIQUE: Both transabdominal and transvaginal ultrasound examinations were performed for complete evaluation of the gestation as well as the maternal uterus, adnexal regions, and pelvic cul-de-sac. Transvaginal technique  was performed to assess early pregnancy. COMPARISON:  01/02/2017 pelvic ultrasound FINDINGS: Intrauterine gestational sac: Tiny focal fluid collection within endometrium versus myometrium at mid uterine segment images 15 and 41, nonspecific. No other potential gestational sac. Yolk sac:  N/A Embryo:  N/A Cardiac Activity: N/A Heart Rate: N/A  bpm Subchorionic hemorrhage:  N/A Maternal uterus/adnexae: Significant thickening of the endometrial complex. Ovaries normal in appearance bilaterally. Small amount of nonspecific free pelvic fluid. No adnexal masses. IMPRESSION: Significant thickening of the endometrial complex. Questionable tiny fluid collection 5 mm greatest diameter at the mid uterine segment, uncertain if endometrial or myometrial, nonspecific. Uncertain whether this represents a small amount of fluid or potentially a tiny gestational sac. In the absence of a definite intrauterine gestational sac, ectopic pregnancy is not excluded. Followup ultrasound recommended in 14 days to reassess and definitively exclude ectopic pregnancy. Electronically Signed   By: Lavonia Dana M.D.   On: 05/21/2017 16:59    MDM +UPT UA, wet prep, GC/chlamydia, CBC, ABO/Rh, quant hCG, HIV, and [place] today to rule out ectopic pregnancy A positive BHCG 280. Ultrasound doesn't confirm IUP, no adnexal mass or free fluid.  C/w Dr. Landry Mellow. Will have patient keep f/u BHCG in office tomorrow. BHCG yesterday was 173. Discussed results with patient. Patient agreeable to with plan for f/u tomorrow.  Assessment and Plan  A: 1. Pregnancy of unknown anatomic location   2. Abdominal pain affecting pregnancy    P; Discharge home Keep f/u with Ob Discussed reasons to return to MAU including s/s ectopic pregnancy  Jorje Guild 05/21/2017, 3:20 PM

## 2017-06-09 IMAGING — MR MR MRA HEAD W/O CM
10 of 11 series · 32 of 48 positions shown · non-contrast
Comparison: Head CT 07/01/2009

CLINICAL DATA: Headache with sharp, stabbing pain on the right.
Began 3 days ago intermittently but has increased and severity.
Right hand tremor.

EXAM:
MRI HEAD WITHOUT CONTRAST
MRA HEAD WITHOUT CONTRAST
TECHNIQUE: Multiplanar, multiecho pulse sequences of the brain and surrounding
structures were obtained without intravenous contrast. Angiographic
images of the head were obtained using MRA technique without
contrast.

[Series 4: T1 · sagittal · 5.0mm · 0.47mm/px · 3 of 24 slices shown]
[im 1/24]
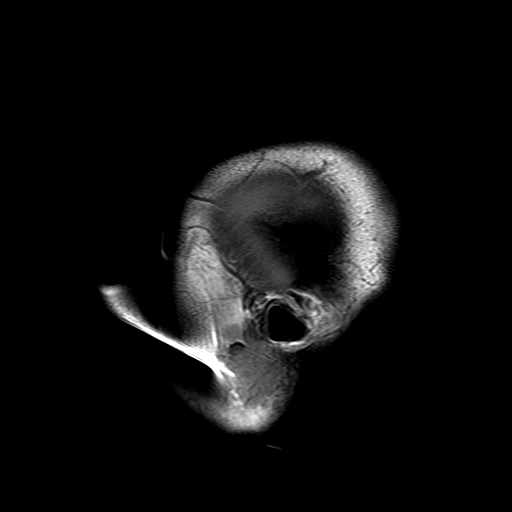
[im 12/24]
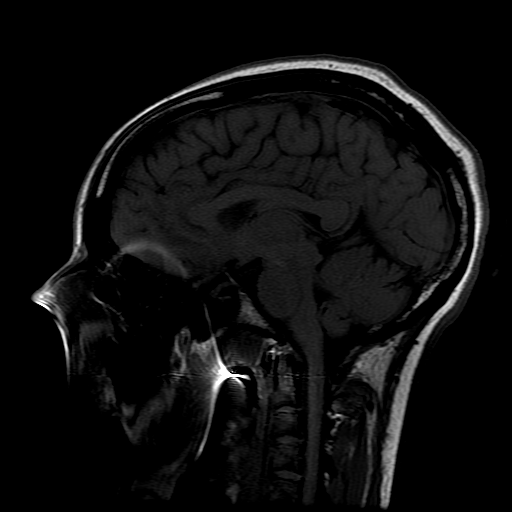
[im 24/24]
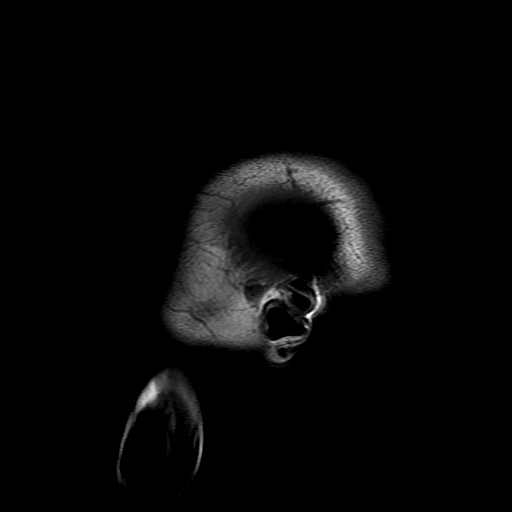

[Series 5: DWI · axial · 3.0mm · 1.09mm/px · z∈[-81,+41]mm · 8 of 93 slices shown (1 of 4)]
[im 1/93]
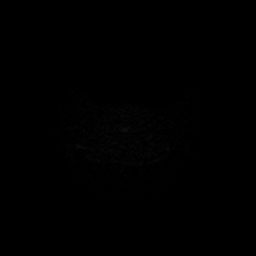
[im 14/93]
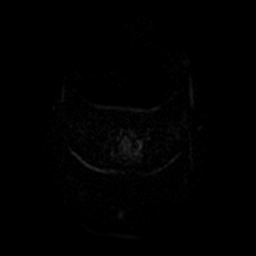
[im 27/93]
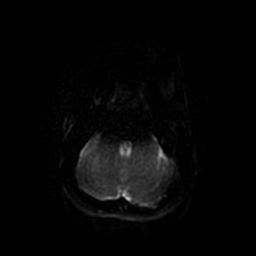
[im 40/93]
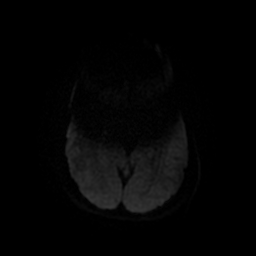
[im 53/93]
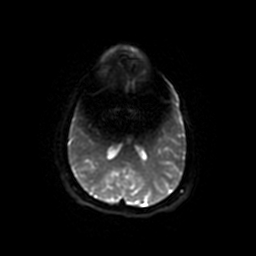
[im 66/93]
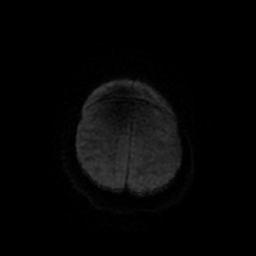
[im 79/93]
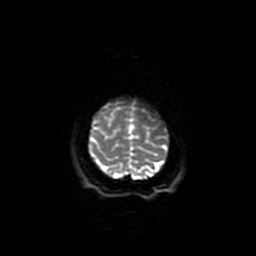
[im 93/93]
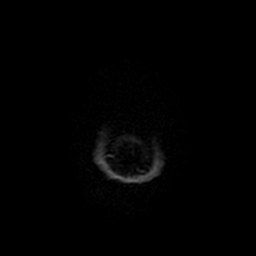

[Series 6: DWI · coronal · 5.0mm · 1.09mm/px · 5 of 58 slices shown (2 of 4)]
[im 1/58]
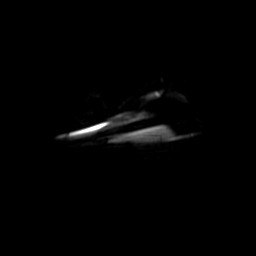
[im 15/58]
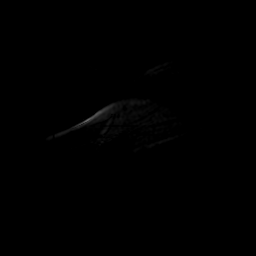
[im 29/58]
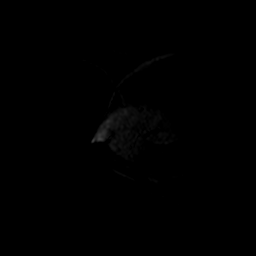
[im 43/58]
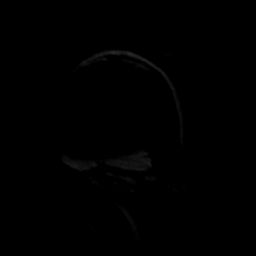
[im 58/58]
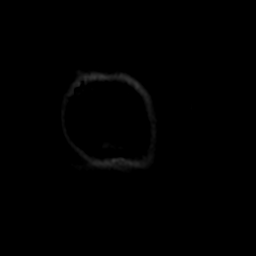

[Series 7: T2 · axial · 5.0mm · 0.43mm/px · z∈[-55,+69]mm · 2 of 23 slices shown (1 of 2)]
[im 1/23]
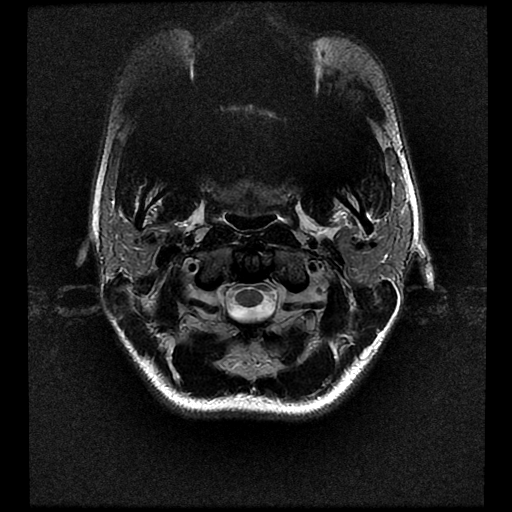
[im 23/23]
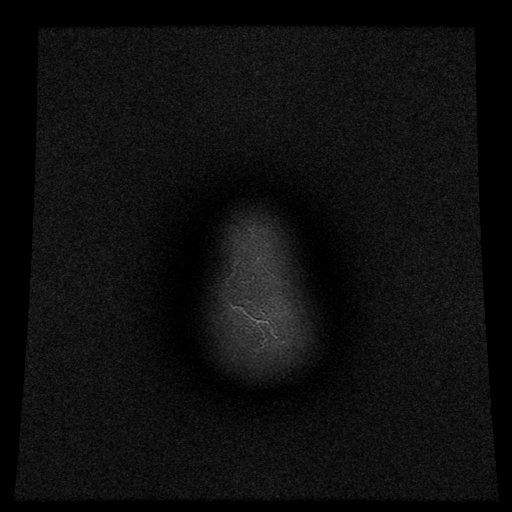

[Series 8: FLAIR · axial · 5.0mm · 0.43mm/px · z∈[-60,+74]mm · 2 of 23 slices shown]
[im 1/23]
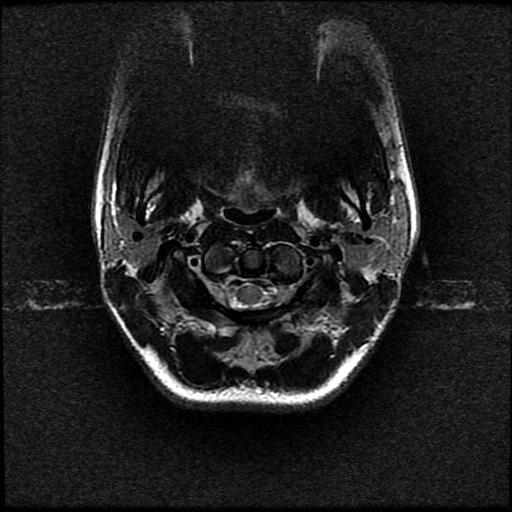
[im 23/23]
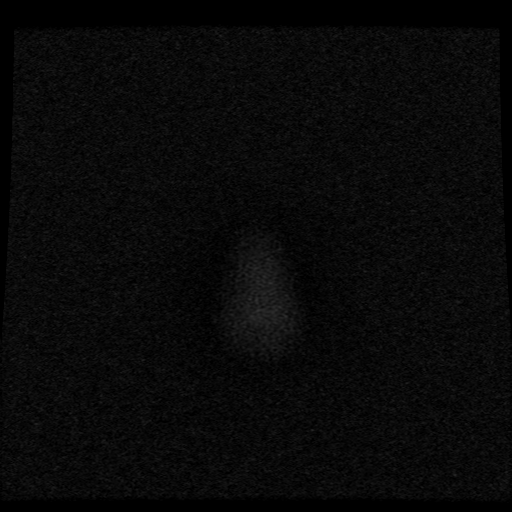

[Series 9: ax mpgr · axial · 5.0mm · 0.43mm/px · z∈[-60,+74]mm · 2 of 23 slices shown]
[im 1/23]
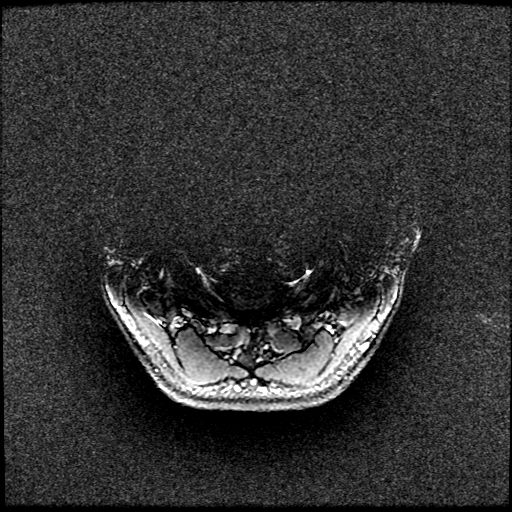
[im 23/23]
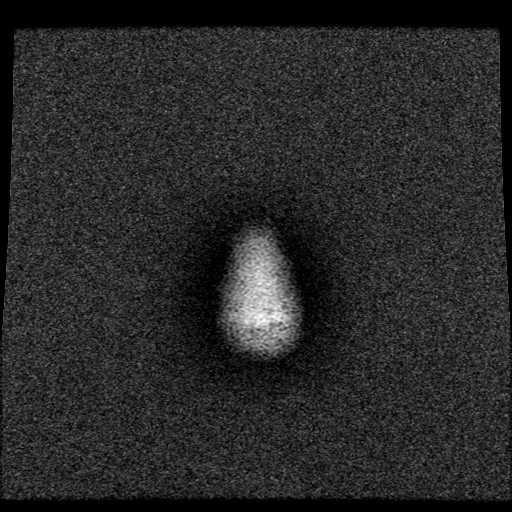

[Series 10: ax fspgr irp · axial · 3.0mm · 0.47mm/px · 1 of 60 slices shown]
[im 1/60]
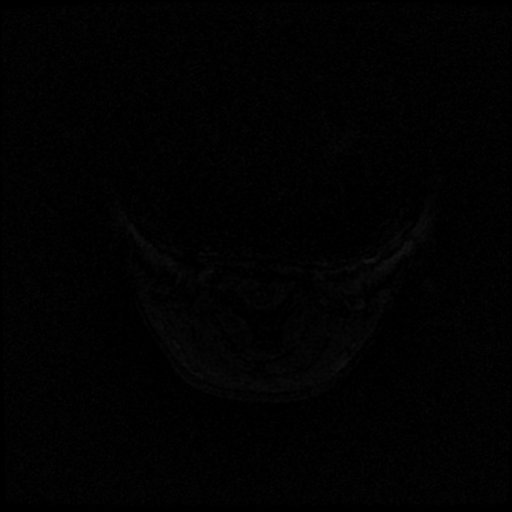

[Series 11: T2 · coronal · 5.0mm · 0.45mm/px · 2 of 26 slices shown (2 of 2)]
[im 1/26]
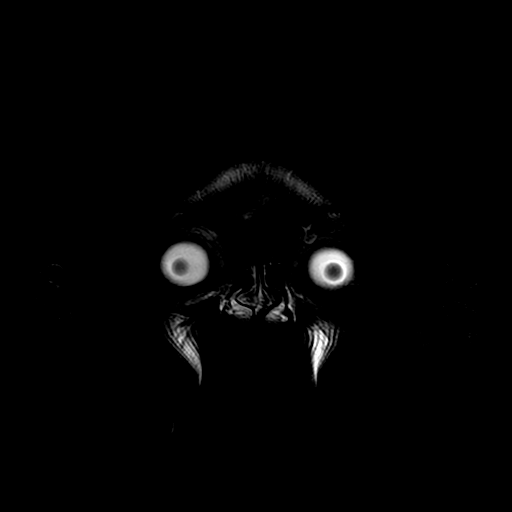
[im 26/26]
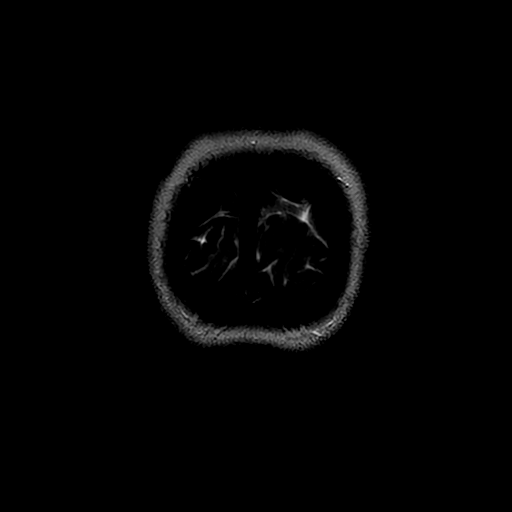

[Series 500: DWI · axial · 3.0mm · 1.09mm/px · z∈[-81,+41]mm · 4 of 49 slices shown (3 of 4)]
[im 1/49]
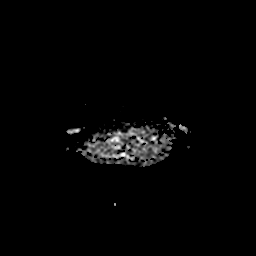
[im 17/49]
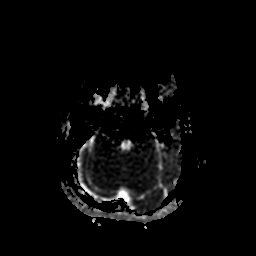
[im 33/49]
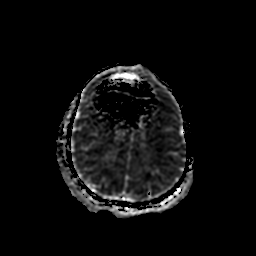
[im 49/49]
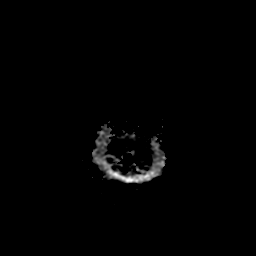

[Series 600: DWI · coronal · 5.0mm · 1.09mm/px · 3 of 30 slices shown (4 of 4)]
[im 1/30]
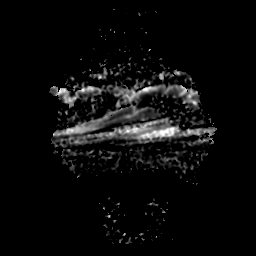
[im 15/30]
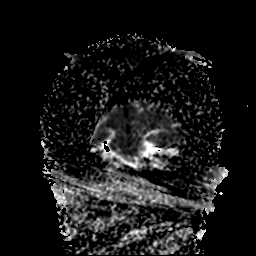
[im 30/30]
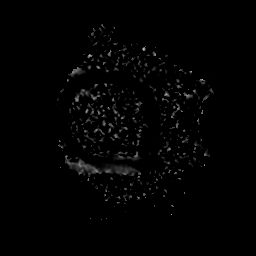

[32 of 48 positions shown; findings below may reference images not displayed]

FINDINGS: MRI HEAD FINDINGS

There is prominent magnetic susceptibility artifact from orthodontic
braces. This obscures much of the posterior fossa as well as the
anterior half of the cerebral hemispheres on axial diffusion and T2*
gradient echo imaging. Within this limitation, no acute infarct or
hemorrhage is identified. This also results in lack of sulcal CSF
signal suppression on FLAIR images which precludes assessment for
subarachnoid hemorrhage.

Ventricles and sulci are normal in size. No brain parenchymal signal
abnormality is identified.

Orbits are unremarkable. Visualized paranasal sinuses are clear.
Mastoid air cells are clear. Major intracranial vascular flow voids
are preserved.

MRA HEAD FINDINGS

The visualized distal vertebral arteries are widely patent and
codominant. PICA, AICA, and SCA origins are patent. Basilar artery
is widely patent. There are patent posterior communicating arteries,
right larger than left. PCAs are patent without evidence of
significant stenosis.

Internal carotid arteries are patent from skullbase to carotid
termini without stenosis. ACAs and MCAs are patent without evidence
of major branch occlusion or significant stenosis. There is normal
variant anatomy at the level of the anterior communicating artery.
No intracranial aneurysm is identified.
IMPRESSION: 1. Prominent artifact from braces. No intracranial abnormality
identified within this limitation.
2. Negative head MRA.

## 2017-06-17 ENCOUNTER — Encounter (HOSPITAL_COMMUNITY): Payer: Self-pay

## 2017-06-17 ENCOUNTER — Inpatient Hospital Stay (HOSPITAL_COMMUNITY)
Admission: AD | Admit: 2017-06-17 | Discharge: 2017-06-17 | Disposition: A | Discharge status: Home / Self Care | Payer: 59 | Source: Ambulatory Visit | Attending: Obstetrics and Gynecology | Admitting: Obstetrics and Gynecology

## 2017-06-17 DIAGNOSIS — Z880 Allergy status to penicillin: Secondary | ICD-10-CM | POA: Diagnosis not present

## 2017-06-17 DIAGNOSIS — Z7982 Long term (current) use of aspirin: Secondary | ICD-10-CM | POA: Diagnosis not present

## 2017-06-17 DIAGNOSIS — K5900 Constipation, unspecified: Secondary | ICD-10-CM

## 2017-06-17 DIAGNOSIS — Z3A08 8 weeks gestation of pregnancy: Secondary | ICD-10-CM | POA: Diagnosis not present

## 2017-06-17 DIAGNOSIS — O99611 Diseases of the digestive system complicating pregnancy, first trimester: Secondary | ICD-10-CM | POA: Diagnosis not present

## 2017-06-17 DIAGNOSIS — O9989 Other specified diseases and conditions complicating pregnancy, childbirth and the puerperium: Secondary | ICD-10-CM | POA: Diagnosis not present

## 2017-06-17 MED ORDER — DOCUSATE SODIUM 50 MG PO CAPS: 50.0000 mg | ORAL_CAPSULE | Freq: Every day | ORAL | 1 refills | Status: DC

## 2017-06-17 MED ORDER — POLYETHYLENE GLYCOL 3350 17 GM/SCOOP PO POWD: 17.0000 g | Freq: Two times a day (BID) | ORAL | 3 refills | Status: DC

## 2017-06-17 NOTE — Discharge Instructions (Signed)
For your constipation, we'll need to try several ways to treat this:  Try first:  Drink plenty of fluid, preferably water, throughout the day  Eat foods high in fiber such as fruits, vegetables, and grains  Exercise, such as walking, is a good way to keep your bowels regular  Drink warm fluids, especially warm prune juice, or decaf coffee  Eat a 1/2 cup of real oatmeal (not instant), 1/2 cup applesauce, and 1/2-1 cup warm prune juice every day  If not helping then try:  If needed, you may take Colace (docusate sodium) stool softener once or twice a day to help keep the stool soft.   If you still are having problems with constipation, you may take Miralax once daily as needed to help keep your bowels regular.  Stop taking it if you have diarrhea.  You can increase this to 3 times a day to help you go as well.  If you still haven't gone to the restroom after three days, it's time to try either a suppository or an enema.  This should make you go.  Continue to take the stool softener and Miralax, even if you use the suppository.  Your goal is to have a soft bowel movement once every other day at the least. Once you start going to the bathroom, cut back until you achieve that goal.

## 2017-06-17 NOTE — MAU Note (Signed)
Pt reports not having a bowel movement in over a week. Has tried colace and miralax. States she is drinking water but "could drink more". States she is having mild cramps and lots of discomfort. A little bit nauseas.

## 2017-06-17 NOTE — MAU Provider Note (Signed)
Chief Complaint: Constipation   SUBJECTIVE HPI: Rebecca Benjamin is a 26 y.o. G3P0020 at [redacted]w[redacted]d who presents to MAU with concern for constipation. Patient states she has been constipated for 8-9 days. She has tried colace and Miralax off and on; for total of only two days without relief. She went to her OB office today and was told to come in for constipation work up. She has been having some nausea and vomited twice yesterday after drinking some soda. Has had decreased PO intake. Having flatus. Increased her fiber intake. Drinking water but states she could drink more. Does not have a history of constipation. Normal BMs are every other day.  Denies abdominal pain, fevers, dysuria, vaginal discharge, blood.    Past Medical History:  Diagnosis Date  . Ectopic pregnancy   . H/O cold sores    OB History  Gravida Para Term Preterm AB Living  3       2 0  SAB TAB Ectopic Multiple Live Births    1 1        # Outcome Date GA Lbr Len/2nd Weight Sex Delivery Anes PTL Lv  3 Current           2 Ectopic 10/2016          1 TAB              Past Surgical History:  Procedure Laterality Date  . DILATION AND EVACUATION N/A 10/26/2016   Procedure: DILATATION AND EVACUATION;  Surgeon: Crawford Givens, MD;  Location: [facility];  Service: Gynecology;  Laterality: N/A;  . FOOT SURGERY Left 2014 and 2015  . LAPAROSCOPY Left 10/26/2016   Procedure: LAPAROSCOPY OPERATIVE;  Surgeon: Crawford Givens, MD;  Location: [facility];  Service: Gynecology;  Laterality: Left;  removal of ectopic pregnancy   . TONSILLECTOMY     Social History   Social History  . Marital status: Married    Spouse name: N/A  . Number of children: 0  . Years of education: 15   Occupational History  . Payroll Specialist    Social History Main Topics  . Smoking status: Never Smoker  . Smokeless tobacco: Never Used  . Alcohol use Yes     Comment: Socially  . Drug use: No  . Sexual activity: Yes    Birth control/ protection: None   Other  Topics Concern  . Not on file   Social History Narrative   Fun: [employer] out with her fiance, shop   Denies religious beliefs effecting health care.    Feels safe at home and denies abuse.    No current facility-administered medications on file prior to encounter.    Current Outpatient Prescriptions on File Prior to Encounter  Medication Sig Dispense Refill  . aspirin EC 81 MG tablet Take 81 mg by mouth daily.    . Prenatal Vit-Fe Fumarate-FA (PRENATAL MULTIVITAMIN) TABS tablet Take 1 tablet by mouth daily at 12 noon.     Allergies  Allergen Reactions  . Amoxicillin Nausea And Vomiting    Has patient had a PCN reaction causing immediate rash, facial/tongue/throat swelling, SOB or lightheadedness with hypotension: no Has patient had a PCN reaction causing severe rash involving mucus membranes or skin necrosis: no Has patient had a PCN reaction that required hospitalization no Has patient had a PCN reaction occurring within the last 10 years: no If all of the above answers are "NO", then may proceed with Cephalosporin use.   . Imitrex [Sumatriptan]  C/o felt generalized flushing, burning, rhinorrhea within 2-3 min of medication    I have reviewed the past Medical Hx, Surgical Hx, Social Hx, Allergies and Medications.   REVIEW OF SYSTEMS All systems reviewed and are negative for acute change except as noted in the HPI.  OBJECTIVE BP 117/81   Pulse 74   Temp 98 F (36.7 C) (Oral)   Resp 16   LMP 04/21/2017    PHYSICAL EXAM Constitutional: Well-developed, well-nourished female in no acute distress.  Cardiovascular: normal rate and rhythm, pulses intact Respiratory: normal rate and effort.  GI: Abd soft, non-tender, non-distended. No guarding.  MS: Extremities nontender, no edema, normal ROM Neurologic: Alert and oriented x 4. No focal deficits Rectal: no impacted stool appreciated, small firm stool pellets palpated and rectum Psych: normal mood and affect  LAB  RESULTS No results found for this or any previous visit (from the past 24 hour(s)).  IMAGING US Ob Comp Less 14 Wks  Result Date: 05/21/2017 CLINICAL DATA:  Abdominal pain affecting pregnancy ; history of ectopic pregnancy in February 2018; quantitative beta HCG = 280 EXAM: OBSTETRIC <14 WK [place] AND TRANSVAGINAL OB US TECHNIQUE: Both transabdominal and transvaginal ultrasound examinations were performed for complete evaluation of the gestation as well as the maternal uterus, adnexal regions, and pelvic cul-de-sac. Transvaginal technique was performed to assess early pregnancy. COMPARISON:  01/02/2017 pelvic ultrasound FINDINGS: Intrauterine gestational sac: Tiny focal fluid collection within endometrium versus myometrium at mid uterine segment images 15 and 41, nonspecific. No other potential gestational sac. Yolk sac:  N/A Embryo:  N/A Cardiac Activity: N/A Heart Rate: N/A  bpm Subchorionic hemorrhage:  N/A Maternal uterus/adnexae: Significant thickening of the endometrial complex. Ovaries normal in appearance bilaterally. Small amount of nonspecific free pelvic fluid. No adnexal masses. IMPRESSION: Significant thickening of the endometrial complex. Questionable tiny fluid collection 5 mm greatest diameter at the mid uterine segment, uncertain if endometrial or myometrial, nonspecific. Uncertain whether this represents a small amount of fluid or potentially a tiny gestational sac. In the absence of a definite intrauterine gestational sac, ectopic pregnancy is not excluded. Followup ultrasound recommended in 14 days to reassess and definitively exclude ectopic pregnancy. Electronically Signed   By: Lavonia Dana M.D.   On: 05/21/2017 16:59   US Ob Transvaginal  Result Date: 05/21/2017 CLINICAL DATA:  Abdominal pain affecting pregnancy ; history of ectopic pregnancy in February 2018; quantitative beta HCG = 280 EXAM: OBSTETRIC <14 WK [place] AND TRANSVAGINAL OB US TECHNIQUE: Both transabdominal and transvaginal  ultrasound examinations were performed for complete evaluation of the gestation as well as the maternal uterus, adnexal regions, and pelvic cul-de-sac. Transvaginal technique was performed to assess early pregnancy. COMPARISON:  01/02/2017 pelvic ultrasound FINDINGS: Intrauterine gestational sac: Tiny focal fluid collection within endometrium versus myometrium at mid uterine segment images 15 and 41, nonspecific. No other potential gestational sac. Yolk sac:  N/A Embryo:  N/A Cardiac Activity: N/A Heart Rate: N/A  bpm Subchorionic hemorrhage:  N/A Maternal uterus/adnexae: Significant thickening of the endometrial complex. Ovaries normal in appearance bilaterally. Small amount of nonspecific free pelvic fluid. No adnexal masses. IMPRESSION: Significant thickening of the endometrial complex. Questionable tiny fluid collection 5 mm greatest diameter at the mid uterine segment, uncertain if endometrial or myometrial, nonspecific. Uncertain whether this represents a small amount of fluid or potentially a tiny gestational sac. In the absence of a definite intrauterine gestational sac, ectopic pregnancy is not excluded. Followup ultrasound recommended in 14 days to reassess and definitively exclude  ectopic pregnancy. Electronically Signed   By: Lavonia Dana M.D.   On: 05/21/2017 16:59    MAU COURSE Vitals and nursing notes reviewed I discussed imaging with patient and she declines abdominal plain films due to radiation exposure Discussed with [place] and they are unable to check for stool burden on [place] No signs of bowel obstruction on exam No fecal impaction appreciated on rectal exam  MDM Plan of care reviewed with patient, including labs and tests ordered and medical treatment.   ASSESSMENT 1. Constipation, unspecified constipation type     PLAN Discharge home in stable condition Patient instructed to take enema today at home Constipation measures given on handout Rx for Miralax and colace  Counseled on  return precautions   Luiz Blare, [place], [place] 06/17/2017, 11:30 AM

## 2017-07-10 ENCOUNTER — Other Ambulatory Visit (HOSPITAL_COMMUNITY)
Admission: RE | Admit: 2017-07-10 | Discharge: 2017-07-10 | Disposition: A | Discharge status: Home / Self Care | Payer: 59 | Source: Ambulatory Visit | Attending: Obstetrics and Gynecology | Admitting: Obstetrics and Gynecology

## 2017-07-10 ENCOUNTER — Other Ambulatory Visit: Payer: Self-pay | Admitting: Obstetrics and Gynecology

## 2017-07-10 DIAGNOSIS — Z124 Encounter for screening for malignant neoplasm of cervix: Secondary | ICD-10-CM | POA: Insufficient documentation

## 2017-07-12 LAB — CYTOLOGY - PAP
Chlamydia: NEGATIVE
Diagnosis: NEGATIVE
NEISSERIA GONORRHEA: NEGATIVE

## 2017-08-05 ENCOUNTER — Inpatient Hospital Stay (HOSPITAL_COMMUNITY)
Admission: AD | Admit: 2017-08-05 | Discharge: 2017-08-05 | Disposition: A | Discharge status: Home / Self Care | Payer: 59 | Source: Ambulatory Visit | Attending: Obstetrics and Gynecology | Admitting: Obstetrics and Gynecology

## 2017-08-05 ENCOUNTER — Other Ambulatory Visit: Payer: Self-pay

## 2017-08-05 ENCOUNTER — Encounter (HOSPITAL_COMMUNITY): Payer: Self-pay | Admitting: *Deleted

## 2017-08-05 DIAGNOSIS — Z3A15 15 weeks gestation of pregnancy: Secondary | ICD-10-CM | POA: Insufficient documentation

## 2017-08-05 DIAGNOSIS — O26892 Other specified pregnancy related conditions, second trimester: Secondary | ICD-10-CM | POA: Diagnosis present

## 2017-08-05 DIAGNOSIS — O99611 Diseases of the digestive system complicating pregnancy, first trimester: Secondary | ICD-10-CM

## 2017-08-05 DIAGNOSIS — K5900 Constipation, unspecified: Secondary | ICD-10-CM | POA: Diagnosis present

## 2017-08-05 HISTORY — DX: Headache, unspecified: R51.9

## 2017-08-05 HISTORY — DX: Headache: R51

## 2017-08-05 LAB — URINALYSIS, ROUTINE W REFLEX MICROSCOPIC
Bilirubin Urine: NEGATIVE
GLUCOSE, UA: NEGATIVE mg/dL
Hgb urine dipstick: NEGATIVE
KETONES UR: NEGATIVE mg/dL
Leukocytes, UA: NEGATIVE
Nitrite: NEGATIVE
PROTEIN: NEGATIVE mg/dL
Specific Gravity, Urine: 1.02 (ref 1.005–1.030)
pH: 7 (ref 5.0–8.0)

## 2017-08-05 MED ORDER — DOCUSATE SODIUM 50 MG PO CAPS: 50.0000 mg | ORAL_CAPSULE | Freq: Two times a day (BID) | ORAL | 1 refills | Status: DC

## 2017-08-05 MED ORDER — PROMETHAZINE HCL 25 MG RE SUPP: 25.0000 mg | Freq: Four times a day (QID) | RECTAL | 1 refills | Status: DC | PRN

## 2017-08-05 NOTE — MAU Note (Signed)
Pt reports using mirilax and colace each morning for the past week and a fleets every other night for the past week with no results.

## 2017-08-05 NOTE — Discharge Instructions (Signed)
Constipation, Adult °Constipation is when a person: °· Poops (has a bowel movement) fewer times in a week than normal. °· Has a hard time pooping. °· Has poop that is dry, hard, or bigger than normal. ° °Follow these instructions at home: °Eating and drinking ° °· Eat foods that have a lot of fiber, such as: °? Fresh fruits and vegetables. °? Whole grains. °? Beans. °· Eat less of foods that are high in fat, low in fiber, or overly processed, such as: °? French fries. °? Hamburgers. °? Cookies. °? Candy. °? Soda. °· Drink enough fluid to keep your pee (urine) clear or pale yellow. °General instructions °· Exercise regularly or as told by your doctor. °· Go to the restroom when you feel like you need to poop. Do not hold it in. °· Take over-the-counter and prescription medicines only as told by your doctor. These include any fiber supplements. °· Do pelvic floor retraining exercises, such as: °? Doing deep breathing while relaxing your lower belly (abdomen). °? Relaxing your pelvic floor while pooping. °· Watch your condition for any changes. °· Keep all follow-up visits as told by your doctor. This is important. °Contact a doctor if: °· You have pain that gets worse. °· You have a fever. °· You have not pooped for 4 days. °· You throw up (vomit). °· You are not hungry. °· You lose weight. °· You are bleeding from the anus. °· You have thin, pencil-like poop (stool). °Get help right away if: °· You have a fever, and your symptoms suddenly get worse. °· You leak poop or have blood in your poop. °· Your belly feels hard or bigger than normal (is bloated). °· You have very bad belly pain. °· You feel dizzy or you faint. °This information is not intended to replace advice given to you by your health care provider. Make sure you discuss any questions you have with your health care provider. °Document Released: 02/13/2008 Document Revised: 03/16/2016 Document Reviewed: 02/15/2016 °Elsevier Interactive Patient Education ©  2017 Elsevier Inc. ° °

## 2017-08-05 NOTE — MAU Note (Signed)
Chief Complaint: No chief complaint on file.   None    SUBJECTIVE HPI: Rebecca Benjamin is a 26 y.o. G3P0020 at [redacted]w[redacted]d who presents to Maternity Admissions reporting nausea and constipation.  States has note had a BM in weeks.  States unable to keep anything down.  Taking Miralax once per day and Colace once per day.  Has tried an enema a few times this week..  States last Zofran was on Saturday.  Drinking a lot of water.  States has good fiber in diet.   Location: lower abd pain Quality: cramping Severity: /10 on pain scale Duration: A month Modifying factors: Nothing Associated signs and symptoms: nausea and vomiting Denies bleeding or leaking fluid  Past Medical History:  Diagnosis Date  . Ectopic pregnancy   . GERD (gastroesophageal reflux disease)    burps frequently  . H/O cold sores   . Headache    OB History  Gravida Para Term Preterm AB Living  3       2 0  SAB TAB Ectopic Multiple Live Births    1 1        # Outcome Date GA Lbr Len/2nd Weight Sex Delivery Anes PTL Lv  3 Current           2 Ectopic 10/2016          1 TAB              Past Surgical History:  Procedure Laterality Date  . DILATION AND EVACUATION N/A 10/26/2016   Procedure: DILATATION AND EVACUATION;  Surgeon: Crawford Givens, MD;  Location: [facility];  Service: Gynecology;  Laterality: N/A;  . FOOT SURGERY Left 2014 and 2015  . LAPAROSCOPY Left 10/26/2016   Procedure: LAPAROSCOPY OPERATIVE;  Surgeon: Crawford Givens, MD;  Location: [facility];  Service: Gynecology;  Laterality: Left;  removal of ectopic pregnancy   . TONSILLECTOMY     Social History   Socioeconomic History  . Marital status: Married    Spouse name: Not on file  . Number of children: 0  . Years of education: 13  . Highest education level: Not on file  Social Needs  . Financial resource strain: Not on file  . Food insecurity - worry: Not on file  . Food insecurity - inability: Not on file  . Transportation needs - medical: Not on file  .  Transportation needs - non-medical: Not on file  Occupational History  . Occupation: Materials engineer  Tobacco Use  . Smoking status: Never Smoker  . Smokeless tobacco: Never Used  Substance and Sexual Activity  . Alcohol use: Yes    Comment: Socially  . Drug use: No  . Sexual activity: Yes    Birth control/protection: None  Other Topics Concern  . Not on file  Social History Narrative   Fun: [employer] Ewings out with her fiance, shop   Denies religious beliefs effecting health care.    Feels safe at home and denies abuse.    Family History  Problem Relation Age of Onset  . Mitral valve prolapse Mother   . Heart disease Mother   . Diabetes Mother   . Healthy Father   . Stroke Maternal Grandmother   . Lung cancer Maternal Grandfather   . Migraines Paternal Grandmother    No current facility-administered medications on file prior to encounter.    Current Outpatient Medications on File Prior to Encounter  Medication Sig Dispense Refill  . aspirin EC 81 MG tablet Take 81 mg by  mouth daily.    Marland Kitchen docusate sodium (COLACE) 50 MG capsule Take 1 capsule (50 mg total) by mouth daily. 30 capsule 1  . ondansetron (ZOFRAN-ODT) 8 MG disintegrating tablet Take 8 mg by mouth every 8 (eight) hours as needed for nausea or vomiting.    . polyethylene glycol powder (GLYCOLAX/MIRALAX) powder Take 17 g by mouth 2 (two) times daily. 527 g 3  . Prenatal Vit-Fe Fumarate-FA (PRENATAL MULTIVITAMIN) TABS tablet Take 1 tablet by mouth daily at 12 noon.     Allergies  Allergen Reactions  . Amoxicillin Nausea And Vomiting    Has patient had a PCN reaction causing immediate rash, facial/tongue/throat swelling, SOB or lightheadedness with hypotension: no Has patient had a PCN reaction causing severe rash involving mucus membranes or skin necrosis: no Has patient had a PCN reaction that required hospitalization no Has patient had a PCN reaction occurring within the last 10 years: no If all of the above answers are  "NO", then may proceed with Cephalosporin use.   . Imitrex [Sumatriptan]     C/o felt generalized flushing, burning, rhinorrhea within 2-3 min of medication    I have reviewed patient's Past Medical Hx, Surgical Hx, Family Hx, Social Hx, medications and allergies.   Review of Systems  Constitutional: Negative.   HENT: Negative.   Eyes: Negative.   Respiratory: Negative.   Cardiovascular: Negative.   Gastrointestinal: Positive for abdominal distention, constipation and vomiting.  Endocrine: Negative.   Genitourinary: Negative.   Musculoskeletal: Negative.   Allergic/Immunologic: Negative.   Neurological: Negative.   Hematological: Negative.   Psychiatric/Behavioral: Negative.     OBJECTIVE Patient Vitals for the past 24 hrs:  BP Temp Temp src Pulse Resp SpO2 Weight  08/05/17 2045 109/70 - - 89 - - -  08/05/17 1856 110/73 98.6 F (37 C) Oral 85 16 100 % 63.2 kg (139 lb 4 oz)   Constitutional: Well-developed, well-nourished female in no acute distress.  Cardiovascular: normal rate Respiratory: normal rate and effort.  GI: Abd soft, non-tender, gravid appropriate for gestational age. Pos BS x 4 normoactive MS: Extremities nontender, no edema, normal ROM Neurologic: Alert and oriented x 4.  GU: Neg CVAT.   BIMANUAL: cervix LTC, uterus 15 week size, no adnexal tenderness or masses.  No CMT.  No stool palpated in rectum  LAB RESULTS Results for orders placed or performed during the hospital encounter of 08/05/17 (from the past 24 hour(s))  Urinalysis, Routine w reflex microscopic     Status: None   Collection Time: 08/05/17  6:59 PM  Result Value Ref Range   Color, Urine YELLOW YELLOW   APPearance CLEAR CLEAR   Specific Gravity, Urine 1.020 1.005 - 1.030   pH 7.0 5.0 - 8.0   Glucose, UA NEGATIVE NEGATIVE mg/dL   Hgb urine dipstick NEGATIVE NEGATIVE   Bilirubin Urine NEGATIVE NEGATIVE   Ketones, ur NEGATIVE NEGATIVE mg/dL   Protein, ur NEGATIVE NEGATIVE mg/dL   Nitrite  NEGATIVE NEGATIVE   Leukocytes, UA NEGATIVE NEGATIVE    IMAGING No results found.  MAU COURSE Orders Placed This Encounter  Procedures  . Urinalysis, Routine w reflex microscopic  . Soap suds enema   No orders of the defined types were placed in this encounter.   MDM PE, UA, enema ASSESSMENT Constipation in pregnancy  PLAN Discussed exam with Dr. Landry Mellow.  Will give pt an soap sud enema.  Increase Miralax to BID and Colace 100mg  BID.  Increase fiber.  Will escribe Phenergan for nausea.  Discharge home in stable condition.      Starla Link, CNM 08/05/2017  9:28 PM

## 2017-08-05 NOTE — MAU Note (Signed)
Can't poop again.  Hasn't pooped since 11/4. whenever she eats she throws up.  (tried to eat pizza today).  Taking the Miralax and colace.  Did 3 fleets a few nights ago- nothing except the fluid came back.

## 2017-08-25 ENCOUNTER — Inpatient Hospital Stay (HOSPITAL_COMMUNITY)
Admission: AD | Admit: 2017-08-25 | Discharge: 2017-08-25 | Disposition: A | Discharge status: Home / Self Care | Payer: 59 | Source: Ambulatory Visit | Attending: Obstetrics and Gynecology | Admitting: Obstetrics and Gynecology

## 2017-08-25 DIAGNOSIS — O368121 Decreased fetal movements, second trimester, fetus 1: Secondary | ICD-10-CM

## 2017-08-25 DIAGNOSIS — O368120 Decreased fetal movements, second trimester, not applicable or unspecified: Secondary | ICD-10-CM | POA: Diagnosis not present

## 2017-08-25 DIAGNOSIS — Z3A18 18 weeks gestation of pregnancy: Secondary | ICD-10-CM | POA: Diagnosis not present

## 2017-08-25 DIAGNOSIS — Z880 Allergy status to penicillin: Secondary | ICD-10-CM | POA: Diagnosis not present

## 2017-08-25 NOTE — MAU Note (Signed)
Pt here with decreased fetal movement. Hasn't felt the baby since yesterday. Call nurse told her to come in.

## 2017-08-25 NOTE — MAU Note (Signed)
Chief Complaint: Decreased Fetal Movement   None    SUBJECTIVE HPI: Rebecca Benjamin is a 26 y.o. G3P0020 at [redacted]w[redacted]d who presents to Maternity Admissions reporting decreased fetal movement.  Denies bleeding or cramping.  Denies nausea or constipation.   Past Medical History:  Diagnosis Date  . Ectopic pregnancy   . GERD (gastroesophageal reflux disease)    burps frequently  . H/O cold sores   . Headache    OB History  Gravida Para Term Preterm AB Living  3       2 0  SAB TAB Ectopic Multiple Live Births    1 1        # Outcome Date GA Lbr Len/2nd Weight Sex Delivery Anes PTL Lv  3 Current           2 Ectopic 10/2016          1 TAB              Past Surgical History:  Procedure Laterality Date  . DILATION AND EVACUATION N/A 10/26/2016   Procedure: DILATATION AND EVACUATION;  Surgeon: Crawford Givens, MD;  Location: [facility];  Service: Gynecology;  Laterality: N/A;  . FOOT SURGERY Left 2014 and 2015  . LAPAROSCOPY Left 10/26/2016   Procedure: LAPAROSCOPY OPERATIVE;  Surgeon: Crawford Givens, MD;  Location: [facility];  Service: Gynecology;  Laterality: Left;  removal of ectopic pregnancy   . TONSILLECTOMY     Social History   Socioeconomic History  . Marital status: Married    Spouse name: Not on file  . Number of children: 0  . Years of education: 5  . Highest education level: Not on file  Social Needs  . Financial resource strain: Not on file  . Food insecurity - worry: Not on file  . Food insecurity - inability: Not on file  . Transportation needs - medical: Not on file  . Transportation needs - non-medical: Not on file  Occupational History  . Occupation: Materials engineer  Tobacco Use  . Smoking status: Never Smoker  . Smokeless tobacco: Never Used  Substance and Sexual Activity  . Alcohol use: Yes    Comment: Socially  . Drug use: No  . Sexual activity: Yes    Birth control/protection: None  Other Topics Concern  . Not on file  Social History Narrative   Fun:  [employer] Ewings out with her fiance, shop   Denies religious beliefs effecting health care.    Feels safe at home and denies abuse.    Family History  Problem Relation Age of Onset  . Mitral valve prolapse Mother   . Heart disease Mother   . Diabetes Mother   . Healthy Father   . Stroke Maternal Grandmother   . Lung cancer Maternal Grandfather   . Migraines Paternal Grandmother    No current facility-administered medications on file prior to encounter.    Current Outpatient Medications on File Prior to Encounter  Medication Sig Dispense Refill  . docusate sodium (COLACE) 50 MG capsule Take 1 capsule (50 mg total) by mouth 2 (two) times daily. 30 capsule 1  . ondansetron (ZOFRAN-ODT) 8 MG disintegrating tablet Take 8 mg by mouth every 8 (eight) hours as needed for nausea or vomiting.    . polyethylene glycol powder (GLYCOLAX/MIRALAX) powder Take 17 g by mouth 2 (two) times daily. 527 g 3  . Prenatal Vit-Fe Fumarate-FA (PRENATAL MULTIVITAMIN) TABS tablet Take 1 tablet by mouth daily at 12 noon.    . promethazine (  PHENERGAN) 25 MG suppository Place 1 suppository (25 mg total) rectally every 6 (six) hours as needed for nausea. 12 suppository 1   Allergies  Allergen Reactions  . Amoxicillin Nausea And Vomiting    Has patient had a PCN reaction causing immediate rash, facial/tongue/throat swelling, SOB or lightheadedness with hypotension: no Has patient had a PCN reaction causing severe rash involving mucus membranes or skin necrosis: no Has patient had a PCN reaction that required hospitalization no Has patient had a PCN reaction occurring within the last 10 years: no If all of the above answers are "NO", then may proceed with Cephalosporin use.   . Imitrex [Sumatriptan]     C/o felt generalized flushing, burning, rhinorrhea within 2-3 min of medication    I have reviewed patient's Past Medical Hx, Surgical Hx, Family Hx, Social Hx, medications and allergies.   Review of Systems   Constitutional: Negative.   HENT: Negative.   Eyes: Negative.   Respiratory: Negative.   Cardiovascular: Negative.   Gastrointestinal: Negative.   All other systems reviewed and are negative.   OBJECTIVE Patient Vitals for the past 24 hrs:  BP Temp Temp src Pulse Resp Height Weight  08/25/17 1918 114/71 98.4 F (36.9 C) Oral 86 18 5\' 6"  (1.676 m) 62.6 kg (138 lb)   Constitutional: Well-developed, well-nourished female in no acute distress.  Cardiovascular: normal rate Respiratory: normal rate and effort.  GI: Abd soft, non-tender, gravid appropriate for gestational age. Pos BS x 4  FHT + MS: Extremities nontender, no edema, normal ROM Neurologic: Alert and oriented x 4.  LAB RESULTS No results found for this or any previous visit (from the past 24 hour(s)).  IMAGING No results found.  MAU COURSE PE, FHT No orders of the defined types were placed in this encounter.   MDM  ASSESSMENT Decreased fetal movement  PLAN Discharge home in stable condition.  Discussed fetal movement and early to feel fetal movement regularly.  Given reassurance with no other problems noted.  Discussed fetal kick counts and when they are started.  Discussed possibility of anterior placenta making it harder to feel fetal movement.      Starla Link, CNM 08/25/2017  8:08 PM

## 2017-09-10 NOTE — L&D Delivery Note (Addendum)
Operative Delivery Note At 11:27 AM a viable and healthy female was delivered via Vaginal, Vacuum Neurosurgeon).  Presentation: vertex; Position: Right,, Occiput,, Anterior; Station: +2.  Verbal consent: obtained from patient.  Risks and benefits discussed in detail.  Risks include, but are not limited to the risks of anesthesia, bleeding, infection, damage to maternal tissues, fetal cephalhematoma.  There is also the risk of inability to effect vaginal delivery of the head, or shoulder dystocia that cannot be resolved by established maneuvers, leading to the need for emergency cesarean section. Indication: fetal bradycardia after deep variable decels APGAR: 6, 9; weight pending.   Placenta status:spontaneous intact not sent .   Cord:  with the following complications: tight CAN x1. Not reducible. Clamped, cut .  Cord pH: 7.24   Anesthesia: epidural , local Instruments: mushroom Episiotomy: None Lacerations: 2nd degreePerineal; left Labial minora Suture Repair: 3.0 chromic Est. Blood Loss (mL): 350  Mom to postpartum.  Baby to Couplet care / Skin to Skin.  Evonte Prestage A Philopateer Strine 01/24/2018, 12:38 PM

## 2017-11-07 LAB — OB RESULTS CONSOLE GC/CHLAMYDIA
CHLAMYDIA, DNA PROBE: NEGATIVE
GC PROBE AMP, GENITAL: NEGATIVE

## 2017-11-07 LAB — OB RESULTS CONSOLE HEPATITIS B SURFACE ANTIGEN: Hepatitis B Surface Ag: NEGATIVE

## 2017-11-07 LAB — OB RESULTS CONSOLE RPR: RPR: NONREACTIVE

## 2017-11-07 LAB — OB RESULTS CONSOLE RUBELLA ANTIBODY, IGM: Rubella: IMMUNE

## 2017-11-07 LAB — OB RESULTS CONSOLE ANTIBODY SCREEN: Antibody Screen: NEGATIVE

## 2017-11-07 LAB — OB RESULTS CONSOLE HIV ANTIBODY (ROUTINE TESTING): HIV: NONREACTIVE

## 2017-11-07 LAB — OB RESULTS CONSOLE ABO/RH: RH TYPE: POSITIVE

## 2017-12-30 ENCOUNTER — Other Ambulatory Visit: Payer: Self-pay | Admitting: Obstetrics and Gynecology

## 2017-12-30 ENCOUNTER — Other Ambulatory Visit (HOSPITAL_COMMUNITY)
Admission: AD | Admit: 2017-12-30 | Discharge: 2017-12-30 | Disposition: A | Discharge status: Home / Self Care | Payer: 59 | Source: Ambulatory Visit | Attending: Obstetrics and Gynecology | Admitting: Obstetrics and Gynecology

## 2017-12-30 DIAGNOSIS — Z3483 Encounter for supervision of other normal pregnancy, third trimester: Secondary | ICD-10-CM | POA: Diagnosis not present

## 2017-12-30 DIAGNOSIS — Z3A36 36 weeks gestation of pregnancy: Secondary | ICD-10-CM | POA: Insufficient documentation

## 2017-12-31 ENCOUNTER — Other Ambulatory Visit: Payer: Self-pay | Admitting: Obstetrics and Gynecology

## 2017-12-31 LAB — BILE ACIDS, TOTAL: BILE ACIDS TOTAL: 9.2 umol/L (ref 4.7–24.5)

## 2018-01-01 ENCOUNTER — Other Ambulatory Visit: Payer: Self-pay

## 2018-01-01 ENCOUNTER — Inpatient Hospital Stay (HOSPITAL_COMMUNITY)
Admission: AD | Admit: 2018-01-01 | Discharge: 2018-01-01 | Disposition: A | Discharge status: Home / Self Care | Payer: 59 | Source: Ambulatory Visit | Attending: Obstetrics and Gynecology | Admitting: Obstetrics and Gynecology

## 2018-01-01 ENCOUNTER — Encounter (HOSPITAL_COMMUNITY): Payer: Self-pay

## 2018-01-01 DIAGNOSIS — Z79899 Other long term (current) drug therapy: Secondary | ICD-10-CM | POA: Insufficient documentation

## 2018-01-01 DIAGNOSIS — Z3A36 36 weeks gestation of pregnancy: Secondary | ICD-10-CM | POA: Insufficient documentation

## 2018-01-01 DIAGNOSIS — O471 False labor at or after 37 completed weeks of gestation: Secondary | ICD-10-CM | POA: Insufficient documentation

## 2018-01-01 DIAGNOSIS — Z880 Allergy status to penicillin: Secondary | ICD-10-CM | POA: Diagnosis not present

## 2018-01-01 DIAGNOSIS — O4290 Premature rupture of membranes, unspecified as to length of time between rupture and onset of labor, unspecified weeks of gestation: Secondary | ICD-10-CM

## 2018-01-01 LAB — POCT FERN TEST: POCT Fern Test: NEGATIVE

## 2018-01-01 NOTE — Discharge Instructions (Signed)
Braxton Hicks Contractions °Contractions of the uterus can occur throughout pregnancy, but they are not always a sign that you are in labor. You may have practice contractions called Braxton Hicks contractions. These false labor contractions are sometimes confused with true labor. °What are Braxton Hicks contractions? °Braxton Hicks contractions are tightening movements that occur in the muscles of the uterus before labor. Unlike true labor contractions, these contractions do not result in opening (dilation) and thinning of the cervix. Toward the end of pregnancy (32-34 weeks), Braxton Hicks contractions can happen more often and may become stronger. These contractions are sometimes difficult to tell apart from true labor because they can be very uncomfortable. You should not feel embarrassed if you go to the hospital with false labor. °Sometimes, the only way to tell if you are in true labor is for your health care provider to look for changes in the cervix. The health care provider will do a physical exam and may monitor your contractions. If you are not in true labor, the exam should show that your cervix is not dilating and your water has not broken. °If there are other health problems associated with your pregnancy, it is completely safe for you to be sent home with false labor. You may continue to have Braxton Hicks contractions until you go into true labor. °How to tell the difference between true labor and false labor °True labor °· Contractions last 30-70 seconds. °· Contractions become very regular. °· Discomfort is usually felt in the top of the uterus, and it spreads to the lower abdomen and low back. °· Contractions do not go away with walking. °· Contractions usually become more intense and increase in frequency. °· The cervix dilates and gets thinner. °False labor °· Contractions are usually shorter and not as strong as true labor contractions. °· Contractions are usually irregular. °· Contractions  are often felt in the front of the lower abdomen and in the groin. °· Contractions may go away when you walk around or change positions while lying down. °· Contractions get weaker and are shorter-lasting as time goes on. °· The cervix usually does not dilate or become thin. °Follow these instructions at home: °· Take over-the-counter and prescription medicines only as told by your health care provider. °· Keep up with your usual exercises and follow other instructions from your health care provider. °· Eat and drink lightly if you think you are going into labor. °· If Braxton Hicks contractions are making you uncomfortable: °? Change your position from lying down or resting to walking, or change from walking to resting. °? Sit and rest in a tub of warm water. °? Drink enough fluid to keep your urine pale yellow. Dehydration may cause these contractions. °? Do slow and deep breathing several times an hour. °· Keep all follow-up prenatal visits as told by your health care provider. This is important. °Contact a health care provider if: °· You have a fever. °· You have continuous pain in your abdomen. °Get help right away if: °· Your contractions become stronger, more regular, and closer together. °· You have fluid leaking or gushing from your vagina. °· You pass blood-tinged mucus (bloody show). °· You have bleeding from your vagina. °· You have low back pain that you never had before. °· You feel your baby’s head pushing down and causing pelvic pressure. °· Your baby is not moving inside you as much as it used to. °Summary °· Contractions that occur before labor are called Braxton   Hicks contractions, false labor, or practice contractions.  Braxton Hicks contractions are usually shorter, weaker, farther apart, and less regular than true labor contractions. True labor contractions usually become progressively stronger and regular and they become more frequent.  Manage discomfort from [facility] contractions by  changing position, resting in a warm bath, drinking plenty of water, or practicing deep breathing. This information is not intended to replace advice given to you by your health care provider. Make sure you discuss any questions you have with your health care provider. Document Released: 01/10/2017 Document Revised: 01/10/2017 Document Reviewed: 01/10/2017 Elsevier Interactive Patient Education  2018 [employer]. Fetal Movement Counts Patient Name: ________________________________________________ Patient Due Date: ____________________ What is a fetal movement count? A fetal movement count is the number of times that you feel your baby move during a certain amount of time. This may also be called a fetal kick count. A fetal movement count is recommended for every pregnant woman. You may be asked to start counting fetal movements as early as week 28 of your pregnancy. Pay attention to when your baby is most active. You may notice your baby's sleep and wake cycles. You may also notice things that make your baby move more. You should do a fetal movement count:  When your baby is normally most active.  At the same time each day.  A good time to count movements is while you are resting, after having something to eat and drink. How do I count fetal movements? 1. Find a quiet, comfortable area. Sit, or lie down on your side. 2. Write down the date, the start time and stop time, and the number of movements that you felt between those two times. Take this information with you to your health care visits. 3. For 2 hours, count kicks, flutters, swishes, rolls, and jabs. You should feel at least 10 movements during 2 hours. 4. You may stop counting after you have felt 10 movements. 5. If you do not feel 10 movements in 2 hours, have something to eat and drink. Then, keep resting and counting for 1 hour. If you feel at least 4 movements during that hour, you may stop counting. Contact a health care provider  if:  You feel fewer than 4 movements in 2 hours.  Your baby is not moving like he or she usually does. Date: ____________ Start time: ____________ Stop time: ____________ Movements: ____________ Date: ____________ Start time: ____________ Stop time: ____________ Movements: ____________ Date: ____________ Start time: ____________ Stop time: ____________ Movements: ____________ Date: ____________ Start time: ____________ Stop time: ____________ Movements: ____________ Date: ____________ Start time: ____________ Stop time: ____________ Movements: ____________ Date: ____________ Start time: ____________ Stop time: ____________ Movements: ____________ Date: ____________ Start time: ____________ Stop time: ____________ Movements: ____________ Date: ____________ Start time: ____________ Stop time: ____________ Movements: ____________ Date: ____________ Start time: ____________ Stop time: ____________ Movements: ____________ This information is not intended to replace advice given to you by your health care provider. Make sure you discuss any questions you have with your health care provider. Document Released: 09/26/2006 Document Revised: 04/25/2016 Document Reviewed: 10/06/2015 Elsevier Interactive Patient Education  2018 [place] precautions

## 2018-01-01 NOTE — MAU Note (Signed)
I have communicated with Dr. Garwin Brothers and reviewed vital signs:  Vitals:   01/01/18 1949 01/01/18 1954  BP: 125/75   Pulse: 83   Resp: 18   Temp: 98.6 F (37 C)   SpO2: 99% 98%    Vaginal exam:  Dilation: Closed Effacement (%): Thick Cervical Position: Posterior Station: -2 Exam by:: Cousins, MD,   Also reviewed contraction pattern and that non-stress test is reactive.  It has been documented that patient is contracting every 2-8 minutes and cervical exam was 0 cm not indicating active labor.  Patient denies any other complaints.  Based on this report provider has given order for discharge.  A discharge order and diagnosis entered by a provider.   Labor discharge instructions reviewed with patient.

## 2018-01-01 NOTE — MAU Provider Note (Signed)
History     Chief Complaint  Patient presents with  . Rupture of Membranes   27 yo G3P0020 BF @ 36 5/[redacted] weeks gestation presents for evaluation of possible rupture of membrane. Pt notes two episode of feeling wet. (+) FM  No vaginal bleeding  OB History    Gravida  3   Para      Term      Preterm      AB  2   Living  0     SAB      TAB  1   Ectopic  1   Multiple      Live Births              Past Medical History:  Diagnosis Date  . Ectopic pregnancy   . H/O cold sores   . Headache    migraines     Past Surgical History:  Procedure Laterality Date  . DILATION AND EVACUATION N/A 10/26/2016   Procedure: DILATATION AND EVACUATION;  Surgeon: Crawford Givens, MD;  Location: [facility];  Service: Gynecology;  Laterality: N/A;  . FOOT SURGERY Left 2014 and 2015  . LAPAROSCOPY Left 10/26/2016   Procedure: LAPAROSCOPY OPERATIVE;  Surgeon: Crawford Givens, MD;  Location: [facility];  Service: Gynecology;  Laterality: Left;  removal of ectopic pregnancy   . TONSILLECTOMY      Family History  Problem Relation Age of Onset  . Mitral valve prolapse Mother   . Diabetes Mother   . Healthy Father   . Stroke Maternal Grandmother   . Cancer Maternal Grandfather   . Migraines Paternal Grandmother     Social History   Tobacco Use  . Smoking status: Never Smoker  . Smokeless tobacco: Never Used  Substance Use Topics  . Alcohol use: Yes    Comment: Socially  . Drug use: No    Allergies:  Allergies  Allergen Reactions  . Amoxicillin Nausea And Vomiting    Has patient had a PCN reaction causing immediate rash, facial/tongue/throat swelling, SOB or lightheadedness with hypotension: no Has patient had a PCN reaction causing severe rash involving mucus membranes or skin necrosis: no Has patient had a PCN reaction that required hospitalization no Has patient had a PCN reaction occurring within the last 10 years: no If all of the above answers are "NO", then may proceed with  Cephalosporin use.   . Imitrex [Sumatriptan]     C/o felt generalized flushing, burning, rhinorrhea within 2-3 min of medication    Medications Prior to Admission  Medication Sig Dispense Refill Last Dose  . docusate sodium (COLACE) 50 MG capsule Take 1 capsule (50 mg total) by mouth 2 (two) times daily. 30 capsule 1   . methylPREDNISolone (MEDROL DOSEPAK) 4 MG TBPK tablet See admin instructions. follow package directions  0 Not Taking at Unknown time  . polyethylene glycol powder (GLYCOLAX/MIRALAX) powder Take 17 g by mouth 2 (two) times daily. 527 g 3 08/05/2017 at Unknown time  . promethazine (PHENERGAN) 25 MG suppository Place 1 suppository (25 mg total) rectally every 6 (six) hours as needed for nausea. 12 suppository 1      Physical Exam   Blood pressure 125/75, pulse 83, temperature 98.6 F (37 C), temperature source Oral, resp. rate 18, last menstrual period 04/21/2017, SpO2 98 %, unknown if currently breastfeeding.  General appearance: alert, cooperative and no distress Abdomen: gravid nontender Pelvic: external genitalia normal and closed/long/ vtx-4. creamy white d/c Extremities: no edema, redness or tenderness in  the calves or thighs  Tracing: baseline 135-140 (+) accel 150-155 irreg ctx ED Course  IMP leaking fluid: leukorrhea of pregnancy IUP @ 36 + weeks P) fern done( neg). D/c home. No evidence of  ROM. Labor prec.  MDM   Marvene Staff, MD 9:16 PM 01/01/2018

## 2018-01-01 NOTE — MAU Note (Signed)
Urine sent to lab 

## 2018-01-01 NOTE — Progress Notes (Addendum)
G3P0 @ 36.[redacted] wksga. Here dt r/o SROM at 1600 ish. Unsure. +FM. Denies bleeding. VSS see flow sheet for details.  EFM appled: FHR 140's  Fern: negative   2008: MD paged.   2010: provider returned page. Report given. otw to assess pt.

## 2018-01-05 LAB — OB RESULTS CONSOLE GBS: STREP GROUP B AG: NEGATIVE

## 2018-01-15 ENCOUNTER — Encounter (HOSPITAL_COMMUNITY): Payer: Self-pay | Admitting: *Deleted

## 2018-01-15 ENCOUNTER — Other Ambulatory Visit: Payer: Self-pay | Admitting: Obstetrics and Gynecology

## 2018-01-15 ENCOUNTER — Telehealth (HOSPITAL_COMMUNITY): Payer: Self-pay | Admitting: *Deleted

## 2018-01-15 NOTE — Telephone Encounter (Signed)
Preadmission screen  

## 2018-01-22 ENCOUNTER — Inpatient Hospital Stay (HOSPITAL_COMMUNITY): Admission: RE | Admit: 2018-01-22 | Payer: 59 | Source: Ambulatory Visit

## 2018-01-24 ENCOUNTER — Inpatient Hospital Stay (HOSPITAL_COMMUNITY): Payer: 59 | Admitting: Anesthesiology

## 2018-01-24 ENCOUNTER — Inpatient Hospital Stay (HOSPITAL_COMMUNITY)
Admission: AD | Admit: 2018-01-24 | Discharge: 2018-01-26 | DRG: 806 | Disposition: A | Discharge status: Home / Self Care | Payer: 59 | Source: Ambulatory Visit | Attending: Obstetrics and Gynecology | Admitting: Obstetrics and Gynecology

## 2018-01-24 ENCOUNTER — Other Ambulatory Visit: Payer: Self-pay

## 2018-01-24 ENCOUNTER — Encounter (HOSPITAL_COMMUNITY): Payer: Self-pay | Admitting: *Deleted

## 2018-01-24 DIAGNOSIS — D649 Anemia, unspecified: Secondary | ICD-10-CM | POA: Diagnosis present

## 2018-01-24 DIAGNOSIS — O872 Hemorrhoids in the puerperium: Secondary | ICD-10-CM | POA: Diagnosis not present

## 2018-01-24 DIAGNOSIS — Z3A39 39 weeks gestation of pregnancy: Secondary | ICD-10-CM

## 2018-01-24 DIAGNOSIS — O9902 Anemia complicating childbirth: Secondary | ICD-10-CM | POA: Diagnosis present

## 2018-01-24 DIAGNOSIS — Z3483 Encounter for supervision of other normal pregnancy, third trimester: Secondary | ICD-10-CM | POA: Diagnosis present

## 2018-01-24 DIAGNOSIS — Z880 Allergy status to penicillin: Secondary | ICD-10-CM

## 2018-01-24 DIAGNOSIS — Z9079 Acquired absence of other genital organ(s): Secondary | ICD-10-CM

## 2018-01-24 DIAGNOSIS — Z8759 Personal history of other complications of pregnancy, childbirth and the puerperium: Secondary | ICD-10-CM

## 2018-01-24 LAB — CBC
HCT: 33.7 % — ABNORMAL LOW (ref 36.0–46.0)
Hemoglobin: 10.8 g/dL — ABNORMAL LOW (ref 12.0–15.0)
MCH: 28.1 pg (ref 26.0–34.0)
MCHC: 32 g/dL (ref 30.0–36.0)
MCV: 87.8 fL (ref 78.0–100.0)
Platelets: 282 10*3/uL (ref 150–400)
RBC: 3.84 MIL/uL — ABNORMAL LOW (ref 3.87–5.11)
RDW: 13.9 % (ref 11.5–15.5)
WBC: 10.2 10*3/uL (ref 4.0–10.5)

## 2018-01-24 LAB — TYPE AND SCREEN
ABO/RH(D): A POS
ANTIBODY SCREEN: NEGATIVE

## 2018-01-24 LAB — RPR: RPR Ser Ql: NONREACTIVE

## 2018-01-24 MED ORDER — PHENYLEPHRINE 40 MCG/ML (10ML) SYRINGE FOR IV PUSH (FOR BLOOD PRESSURE SUPPORT)
PREFILLED_SYRINGE | INTRAVENOUS | Status: AC
  Filled 2018-01-24: qty 10

## 2018-01-24 MED ORDER — OXYTOCIN 10 UNIT/ML IJ SOLN
INTRAMUSCULAR | Status: AC
  Filled 2018-01-24: qty 1

## 2018-01-24 MED ORDER — LACTATED RINGERS IV SOLN: 500.0000 mL | Freq: Once | INTRAVENOUS | Status: DC

## 2018-01-24 MED ORDER — LIDOCAINE HCL (PF) 1 % IJ SOLN
30.0000 mL | INTRAMUSCULAR | Status: DC | PRN
  Administered 2018-01-24: 30 mL via SUBCUTANEOUS
  Filled 2018-01-24: qty 30

## 2018-01-24 MED ORDER — OXYCODONE-ACETAMINOPHEN 5-325 MG PO TABS: 1.0000 | ORAL_TABLET | ORAL | Status: DC | PRN

## 2018-01-24 MED ORDER — COCONUT OIL OIL
1.0000 "application " | TOPICAL_OIL | Status: DC | PRN
  Filled 2018-01-24: qty 120

## 2018-01-24 MED ORDER — TERBUTALINE SULFATE 1 MG/ML IJ SOLN
0.2500 mg | Freq: Once | INTRAMUSCULAR | Status: DC | PRN
  Filled 2018-01-24: qty 1

## 2018-01-24 MED ORDER — ACETAMINOPHEN 325 MG PO TABS
650.0000 mg | ORAL_TABLET | ORAL | Status: DC | PRN
  Administered 2018-01-24 – 2018-01-25 (×2): 650 mg via ORAL
  Filled 2018-01-24 (×2): qty 2

## 2018-01-24 MED ORDER — ONDANSETRON HCL 4 MG/2ML IJ SOLN: 4.0000 mg | INTRAMUSCULAR | Status: DC | PRN

## 2018-01-24 MED ORDER — BUTORPHANOL TARTRATE 1 MG/ML IJ SOLN
2.0000 mg | Freq: Once | INTRAMUSCULAR | Status: AC
  Administered 2018-01-24: 2 mg via INTRAVENOUS

## 2018-01-24 MED ORDER — IBUPROFEN 600 MG PO TABS
600.0000 mg | ORAL_TABLET | Freq: Four times a day (QID) | ORAL | Status: DC
  Administered 2018-01-24 – 2018-01-26 (×7): 600 mg via ORAL
  Filled 2018-01-24 (×9): qty 1

## 2018-01-24 MED ORDER — IBUPROFEN 600 MG PO TABS
600.0000 mg | ORAL_TABLET | Freq: Four times a day (QID) | ORAL | Status: DC | PRN
  Administered 2018-01-24 (×2): 600 mg via ORAL
  Filled 2018-01-24: qty 1

## 2018-01-24 MED ORDER — DIBUCAINE 1 % RE OINT
1.0000 "application " | TOPICAL_OINTMENT | RECTAL | Status: DC | PRN
  Filled 2018-01-24: qty 28

## 2018-01-24 MED ORDER — BUTORPHANOL TARTRATE 1 MG/ML IJ SOLN
INTRAMUSCULAR | Status: AC
  Filled 2018-01-24: qty 2

## 2018-01-24 MED ORDER — EPHEDRINE 5 MG/ML INJ
10.0000 mg | INTRAVENOUS | Status: DC | PRN
  Filled 2018-01-24: qty 2

## 2018-01-24 MED ORDER — ACETAMINOPHEN 325 MG PO TABS
650.0000 mg | ORAL_TABLET | ORAL | Status: DC | PRN
  Filled 2018-01-24: qty 2

## 2018-01-24 MED ORDER — DIPHENHYDRAMINE HCL 25 MG PO CAPS: 25.0000 mg | ORAL_CAPSULE | Freq: Four times a day (QID) | ORAL | Status: DC | PRN

## 2018-01-24 MED ORDER — LIDOCAINE HCL (PF) 1 % IJ SOLN
INTRAMUSCULAR | Status: AC
  Filled 2018-01-24: qty 30

## 2018-01-24 MED ORDER — SENNOSIDES-DOCUSATE SODIUM 8.6-50 MG PO TABS
2.0000 | ORAL_TABLET | ORAL | Status: DC
  Administered 2018-01-24 – 2018-01-25 (×2): 2 via ORAL
  Filled 2018-01-24 (×2): qty 2

## 2018-01-24 MED ORDER — BENZOCAINE-MENTHOL 20-0.5 % EX AERO
1.0000 "application " | INHALATION_SPRAY | CUTANEOUS | Status: DC | PRN
  Administered 2018-01-25: 1 via TOPICAL
  Filled 2018-01-24 (×4): qty 56

## 2018-01-24 MED ORDER — OXYTOCIN 40 UNITS IN LACTATED RINGERS INFUSION - SIMPLE MED: 2.5000 [IU]/h | INTRAVENOUS | Status: DC

## 2018-01-24 MED ORDER — ONDANSETRON HCL 4 MG/2ML IJ SOLN: 4.0000 mg | Freq: Four times a day (QID) | INTRAMUSCULAR | Status: DC | PRN

## 2018-01-24 MED ORDER — PHENYLEPHRINE 40 MCG/ML (10ML) SYRINGE FOR IV PUSH (FOR BLOOD PRESSURE SUPPORT)
80.0000 ug | PREFILLED_SYRINGE | INTRAVENOUS | Status: DC | PRN
  Filled 2018-01-24: qty 5

## 2018-01-24 MED ORDER — OXYCODONE HCL 5 MG PO TABS
10.0000 mg | ORAL_TABLET | ORAL | Status: DC | PRN
  Administered 2018-01-25 – 2018-01-26 (×3): 10 mg via ORAL
  Filled 2018-01-24 (×3): qty 2

## 2018-01-24 MED ORDER — DIPHENHYDRAMINE HCL 50 MG/ML IJ SOLN: 12.5000 mg | INTRAMUSCULAR | Status: DC | PRN

## 2018-01-24 MED ORDER — FENTANYL 2.5 MCG/ML BUPIVACAINE 1/10 % EPIDURAL INFUSION (WH - ANES)
14.0000 mL/h | INTRAMUSCULAR | Status: DC | PRN
  Administered 2018-01-24: 14 mL/h via EPIDURAL

## 2018-01-24 MED ORDER — LACTATED RINGERS IV SOLN
500.0000 mL | INTRAVENOUS | Status: DC | PRN
Start: 2018-01-24 — End: 2018-01-25

## 2018-01-24 MED ORDER — SOD CITRATE-CITRIC ACID 500-334 MG/5ML PO SOLN: 30.0000 mL | ORAL | Status: DC | PRN

## 2018-01-24 MED ORDER — OXYCODONE HCL 5 MG PO TABS: 5.0000 mg | ORAL_TABLET | ORAL | Status: DC | PRN

## 2018-01-24 MED ORDER — FENTANYL 2.5 MCG/ML BUPIVACAINE 1/10 % EPIDURAL INFUSION (WH - ANES)
INTRAMUSCULAR | Status: AC
  Filled 2018-01-24: qty 100

## 2018-01-24 MED ORDER — SIMETHICONE 80 MG PO CHEW: 80.0000 mg | CHEWABLE_TABLET | ORAL | Status: DC | PRN

## 2018-01-24 MED ORDER — ZOLPIDEM TARTRATE 5 MG PO TABS: 5.0000 mg | ORAL_TABLET | Freq: Every evening | ORAL | Status: DC | PRN

## 2018-01-24 MED ORDER — LACTATED RINGERS IV SOLN
INTRAVENOUS | Status: DC
  Administered 2018-01-24: 07:00:00 via INTRAVENOUS

## 2018-01-24 MED ORDER — OXYTOCIN BOLUS FROM INFUSION
500.0000 mL | Freq: Once | INTRAVENOUS | Status: AC
  Administered 2018-01-24: 500 mL via INTRAVENOUS

## 2018-01-24 MED ORDER — OXYTOCIN 10 UNIT/ML IJ SOLN: 10.0000 [IU] | Freq: Once | INTRAMUSCULAR | Status: DC

## 2018-01-24 MED ORDER — ONDANSETRON HCL 4 MG PO TABS: 4.0000 mg | ORAL_TABLET | ORAL | Status: DC | PRN

## 2018-01-24 MED ORDER — WITCH HAZEL-GLYCERIN EX PADS
1.0000 "application " | MEDICATED_PAD | CUTANEOUS | Status: DC | PRN
  Administered 2018-01-25: 1 via TOPICAL

## 2018-01-24 MED ORDER — FERROUS SULFATE 325 (65 FE) MG PO TABS: 325.0000 mg | ORAL_TABLET | Freq: Two times a day (BID) | ORAL | Status: DC

## 2018-01-24 MED ORDER — OXYTOCIN 40 UNITS IN LACTATED RINGERS INFUSION - SIMPLE MED: 1.0000 m[IU]/min | INTRAVENOUS | Status: DC

## 2018-01-24 MED ORDER — OXYTOCIN 40 UNITS IN LACTATED RINGERS INFUSION - SIMPLE MED
INTRAVENOUS | Status: AC
  Filled 2018-01-24: qty 1000

## 2018-01-24 MED ORDER — PRENATAL MULTIVITAMIN CH
1.0000 | ORAL_TABLET | Freq: Every day | ORAL | Status: DC
  Filled 2018-01-24 (×2): qty 1

## 2018-01-24 MED ORDER — LIDOCAINE HCL (PF) 1 % IJ SOLN
INTRAMUSCULAR | Status: DC | PRN
  Administered 2018-01-24 (×2): 5 mL via EPIDURAL

## 2018-01-24 MED ORDER — OXYCODONE-ACETAMINOPHEN 5-325 MG PO TABS: 2.0000 | ORAL_TABLET | ORAL | Status: DC | PRN

## 2018-01-24 MED ORDER — LACTATED RINGERS IV SOLN
500.0000 mL | Freq: Once | INTRAVENOUS | Status: AC
  Administered 2018-01-24: 500 mL via INTRAVENOUS

## 2018-01-24 NOTE — Anesthesia Postprocedure Evaluation (Signed)
Anesthesia Post Note  Patient: Rebecca Benjamin  Procedure(s) Performed: AN AD HOC LABOR EPIDURAL     Patient location during evaluation: Mother Baby Anesthesia Type: Epidural Level of consciousness: awake and alert and oriented Pain management: satisfactory to patient Vital Signs Assessment: post-procedure vital signs reviewed and stable Respiratory status: spontaneous breathing and nonlabored ventilation Cardiovascular status: stable Postop Assessment: no headache, no backache, no signs of nausea or vomiting, adequate PO intake, patient able to bend at knees and able to ambulate (patient up walking) Anesthetic complications: no    Last Vitals:  Vitals:   01/24/18 1300 01/24/18 1330  BP: (!) 127/99 120/74  Pulse: 96 88  Resp: 20 18  Temp:    SpO2:      Last Pain:  Vitals:   01/24/18 1415  TempSrc:   PainSc: 0-No pain   Pain Goal: Patients Stated Pain Goal: 0 (01/24/18 0640)               [name]

## 2018-01-24 NOTE — Anesthesia Preprocedure Evaluation (Signed)
Anesthesia Evaluation  Patient identified by MRN, date of birth, ID band Patient awake    Reviewed: Allergy & Precautions, H&P , NPO status , Patient's Chart, lab work & pertinent test results  Airway Mallampati: II   Neck ROM: full    Dental   Pulmonary neg pulmonary ROS,    breath sounds clear to auscultation       Cardiovascular negative cardio ROS   Rhythm:regular Rate:Normal     Neuro/Psych  Headaches,    GI/Hepatic   Endo/Other    Renal/GU      Musculoskeletal   Abdominal   Peds  Hematology  (+) anemia ,   Anesthesia Other Findings   Reproductive/Obstetrics (+) Pregnancy                             Anesthesia Physical Anesthesia Plan  ASA: II  Anesthesia Plan: Epidural   Post-op Pain Management:    Induction: Intravenous  PONV Risk Score and Plan: 2 and Treatment may vary due to age or medical condition  Airway Management Planned: Natural Airway  Additional Equipment:   Intra-op Plan:   Post-operative Plan:   Informed Consent: I have reviewed the patients History and Physical, chart, labs and discussed the procedure including the risks, benefits and alternatives for the proposed anesthesia with the patient or authorized representative who has indicated his/her understanding and acceptance.     Plan Discussed with: CRNA, Anesthesiologist and Surgeon  Anesthesia Plan Comments:         Anesthesia Quick Evaluation

## 2018-01-24 NOTE — Anesthesia Pain Management Evaluation Note (Signed)
  CRNA Pain Management Visit Note  Patient: Rebecca Benjamin, 27 y.o., female  "Hello I am a member of the anesthesia team at [facility]. We have an anesthesia team available at all times to provide care throughout the hospital, including epidural management and anesthesia for C-section. I don't know your plan for the delivery whether it a natural birth, water birth, IV sedation, nitrous supplementation, doula or epidural, but we want to meet your pain goals."   1.Was your pain managed to your expectations on prior hospitalizations?   Yes   2.What is your expectation for pain management during this hospitalization?     Labor support without medications  3.How can we help you reach that goal? Be on call for change of plan, pt almost complete and delivery eminent at time of consult   Record the patient's initial score and the patient's pain goal.   Pain: 9  Pain Goal: 10 [facility] wants you to be able to say your pain was always managed very well.  Rebecca Benjamin 01/24/2018

## 2018-01-24 NOTE — Lactation Note (Signed)
This note was copied from a baby's chart. Lactation Consultation Note  Patient Name: Rebecca Benjamin [id number] Date: 01/24/2018 Reason for consult: Initial assessment;Primapara;1st time breastfeeding;Term  64 hours old female who is being exclusively BF by his mother, she's a P1. Baby was asleep and swaddled in visitor's arms when entering the room, offered assistance with latch and mom voiced baby has already fed but she also added that she'd like to have someone from lactation to observe a feeding. LC undressed baby STS and placed him to the right breast in football position. Baby having a hard time staying awake, tried to do some hand expression with mom but had to stop because her nipples were too sensitive. No colostrum seen yet at this point.   Baby fell asleep without latching on. Explained to mom that feedings are baby led and if he fed about 1/2 hour ago, he'll probably be ready in another hour or two. Discussed feeding cues. Per mom feeding at the breast are comfortable, and both nipples looked intact upon examination. She already has some coconut oil, discussed treatment for sore nipples.  Encouraged mom to feed baby STS 8-12 times/24 hours or sooner if feeding cues are present. Reviewed BF brochure, BF resources and feeding diary, both parents are aware of [facility] services and will call PRN.   Maternal Data Formula Feeding for Exclusion: No Has patient been taught Hand Expression?: Yes(but had to stop, nipples were really sensitive) Does the patient have breastfeeding experience prior to this delivery?: No  Feeding Feeding Type: Breast Fed Length of feed: 10 min  LATCH Score Latch: Repeated attempts needed to sustain latch, nipple held in mouth throughout feeding, stimulation needed to elicit sucking reflex.  Audible Swallowing: A few with stimulation  Type of Nipple: Everted at rest and after stimulation  Comfort (Breast/Nipple): Soft / non-tender  Hold (Positioning): Assistance  needed to correctly position infant at breast and maintain latch.  LATCH Score: 7  Interventions Interventions: Breast feeding basics reviewed;Assisted with latch;Skin to skin;Breast massage;Hand express;Breast compression;Adjust position;Support pillows;Position options;Coconut oil  Lactation Tools Discussed/Used WIC Program: Yes   Consult Status Consult Status: Follow-up Date: 01/25/18 Follow-up type: In-patient    Tish Begin Francene Boyers 01/24/2018, 7:28 PM

## 2018-01-24 NOTE — Progress Notes (Signed)
Dr Maudry Diego notified of pt presenting complaint.  Order to admit pt for delivery

## 2018-01-24 NOTE — MAU Note (Signed)
Pt presents to MAU c/o ctx every 3-32min. Pt states she thinks she has been leaking a clear fluid since 0100 pt reports a bloody show. +FM. Pt denies any complications in the pregnancy.

## 2018-01-24 NOTE — Anesthesia Procedure Notes (Signed)
Epidural Patient location during procedure: OB Start time: 01/24/2018 10:24 AM End time: 01/24/2018 10:34 AM  Staffing Anesthesiologist: Albertha Ghee, MD Performed: anesthesiologist   Preanesthetic Checklist Completed: patient identified, site marked, pre-op evaluation, timeout performed, IV checked, risks and benefits discussed and monitors and equipment checked  Epidural Patient position: sitting Prep: DuraPrep Patient monitoring: heart rate, cardiac monitor, continuous pulse ox and blood pressure Approach: midline Location: L2-L3 Injection technique: LOR saline  Needle:  Needle type: Tuohy  Needle gauge: 17 G Needle length: 9 cm Needle insertion depth: 4 cm Catheter type: closed end flexible Catheter size: 19 Gauge Catheter at skin depth: 11 cm Test dose: negative and Other  Assessment Events: blood not aspirated, injection not painful, no injection resistance and negative IV test  Additional Notes Informed consent obtained prior to proceeding including risk of failure, 1% risk of PDPH, risk of minor discomfort and bruising.  Discussed rare but serious complications including epidural abscess, permanent nerve injury, epidural hematoma.  Discussed alternatives to epidural analgesia and patient desires to proceed.  Timeout performed pre-procedure verifying patient name, procedure, and platelet count.  Patient tolerated procedure well. Reason for block:procedure for pain

## 2018-01-24 NOTE — H&P (Signed)
Rebecca Benjamin is a 27 y.o. female presenting at term in active labor. Intact membrane  OB History    Gravida  3   Para      Term      Preterm      AB  2   Living  0     SAB  1   TAB  0   Ectopic  1   Multiple      Live Births             Past Medical History:  Diagnosis Date  . Ectopic pregnancy   . H/O cold sores   . Headache    migraines    Past Surgical History:  Procedure Laterality Date  . DILATION AND CURETTAGE OF UTERUS    . DILATION AND EVACUATION N/A 10/26/2016   Procedure: DILATATION AND EVACUATION;  Surgeon: Crawford Givens, MD;  Location: [facility];  Service: Gynecology;  Laterality: N/A;  . FOOT SURGERY Left 2014 and 2015  . LAPAROSCOPY Left 10/26/2016   Procedure: LAPAROSCOPY OPERATIVE;  Surgeon: Crawford Givens, MD;  Location: [facility];  Service: Gynecology;  Laterality: Left;  removal of ectopic pregnancy   . TONSILLECTOMY     Family History: family history includes Cancer in her maternal grandfather; Depression in her mother; Diabetes in her mother; Healthy in her father; Hypertension in her mother; Migraines in her paternal grandmother; Mitral valve prolapse in her mother; Stroke in her maternal grandmother. Social History:  reports that she has never smoked. She has never used smokeless tobacco. She reports that she drinks alcohol. She reports that she does not use drugs.     Maternal Diabetes: No Genetic Screening: Declined Maternal Ultrasounds/Referrals: Normal Fetal Ultrasounds or other Referrals:  None Maternal Substance Abuse:  No Significant Maternal Medications:  None Significant Maternal Lab Results:  Lab values include: Group B Strep negative Other Comments:  PUPPS. treated with medrol taper  Review of Systems  All other systems reviewed and are negative.  History Dilation: 8.5 Effacement (%): 100 Station: 0 Exam by:: Philis Pique RN  Blood pressure 139/76, pulse (!) 102, temperature 97.7 F (36.5 C), temperature source Oral, resp.  rate 18, last menstrual period 04/21/2017, unknown if currently breastfeeding. Exam Physical Exam  Constitutional: She is oriented to person, place, and time. She appears well-developed and well-nourished. She appears distressed.  Neck: Neck supple.  Cardiovascular: Regular rhythm.  Respiratory: Effort normal.  GI: Soft.  Musculoskeletal: She exhibits no edema.  Neurological: She is alert and oriented to person, place, and time. She has normal reflexes.  Skin: Skin is warm and dry.    Prenatal labs: ABO, Rh: A/Positive/-- (02/28 0000) Antibody: Negative (02/28 0000) Rubella: Immune (02/28 0000) RPR: Nonreactive (02/28 0000)  HBsAg: Negative (02/28 0000)  HIV: Non-reactive (02/28 0000)  GBS: Negative (04/28 0000)   Assessment/Plan: Active phase Term gestation P) admit. Routine labs. Amniotomy prn. Analgesic prn  Rebecca Benjamin 01/24/2018, 6:50 AM  addendum: amniotomy done clear fluid. ISE placed

## 2018-01-25 DIAGNOSIS — Z8759 Personal history of other complications of pregnancy, childbirth and the puerperium: Secondary | ICD-10-CM

## 2018-01-25 DIAGNOSIS — O9902 Anemia complicating childbirth: Secondary | ICD-10-CM

## 2018-01-25 HISTORY — DX: Anemia complicating childbirth: O99.02

## 2018-01-25 HISTORY — DX: Personal history of other complications of pregnancy, childbirth and the puerperium: Z87.59

## 2018-01-25 LAB — CBC
HEMATOCRIT: 24.2 % — AB (ref 36.0–46.0)
Hemoglobin: 8 g/dL — ABNORMAL LOW (ref 12.0–15.0)
MCH: 29.1 pg (ref 26.0–34.0)
MCHC: 33.1 g/dL (ref 30.0–36.0)
MCV: 88 fL (ref 78.0–100.0)
PLATELETS: 207 10*3/uL (ref 150–400)
RBC: 2.75 MIL/uL — AB (ref 3.87–5.11)
RDW: 14 % (ref 11.5–15.5)
WBC: 14.8 10*3/uL — AB (ref 4.0–10.5)

## 2018-01-25 MED ORDER — POLYSACCHARIDE IRON COMPLEX 150 MG PO CAPS
150.0000 mg | ORAL_CAPSULE | Freq: Every day | ORAL | Status: DC
  Administered 2018-01-25 – 2018-01-26 (×2): 150 mg via ORAL
  Filled 2018-01-25 (×2): qty 1

## 2018-01-25 MED ORDER — MAGNESIUM OXIDE 400 (241.3 MG) MG PO TABS
400.0000 mg | ORAL_TABLET | Freq: Every day | ORAL | Status: DC
  Administered 2018-01-25 – 2018-01-26 (×2): 400 mg via ORAL
  Filled 2018-01-25 (×2): qty 1

## 2018-01-25 NOTE — Progress Notes (Signed)
PPD # 1 SVD Information for the patient's newborn:  Rebecca Benjamin, Rebecca Benjamin [[id number]]  female      Breast feeding  / Circumcision completed Baby name: [name]  S:  Reports feeling sore but well.             Tolerating po/ No nausea or vomiting             Bleeding is light             Pain controlled with ibuprofen (OTC)             Up ad lib / ambulatory / voiding without difficulties        O:  A & O x 3, in no apparent distress              VS:  Vitals:   01/24/18 1300 01/24/18 1330 01/24/18 1730 01/25/18 0616  BP: (!) 127/99 120/74 114/72 108/72  Pulse: 96 88 (!) 107 77  Resp: 20 18 18 17   Temp:   98.9 F (37.2 C) 98.3 F (36.8 C)  TempSrc:   Oral Oral  SpO2:   96% 99%  Weight:      Height:        LABS:  Recent Labs    01/24/18 0705 01/25/18 0554  WBC 10.2 14.8*  HGB 10.8* 8.0*  HCT 33.7* 24.2*  PLT 282 207    Blood type: --/--/A POS (05/17 0700)  Rubella: Immune (02/28 0000)   I&O: I/O last 3 completed shifts: In: -  Out: 1000 [Urine:650; Blood:350]          No intake/output data recorded.  Lungs: Clear and unlabored  Heart: regular rate and rhythm / no murmurs  Abdomen: soft, non-tender, non-distended             Fundus: firm, non-tender, U-1  Perineum: mod edema, repair intact  Lochia: small  Extremities: no edema, no calf pain or tenderness    A/P: PPD # 1 26 y.o., [id number]   Principal Problem:   VAVD 5/17 Active Problems:   Postpartum care following vaginal delivery   Obstetrical laceration -  2nd degreePerineal; left Labial minora   Maternal anemia, with delivery  - started oral Fe and Mag ox  Doing well - stable status  Routine post partum orders  Anticipate discharge tomorrow    Juliene Pina, MSN, CNM 01/25/2018, 9:18 AM

## 2018-01-26 ENCOUNTER — Encounter (HOSPITAL_COMMUNITY): Payer: Self-pay

## 2018-01-26 MED ORDER — DIBUCAINE 1 % RE OINT: 1.0000 "application " | TOPICAL_OINTMENT | RECTAL | 0 refills | Status: DC | PRN

## 2018-01-26 MED ORDER — POLYSACCHARIDE IRON COMPLEX 150 MG PO CAPS: 150.0000 mg | ORAL_CAPSULE | Freq: Every day | ORAL | Status: DC

## 2018-01-26 MED ORDER — IBUPROFEN 600 MG PO TABS: 600.0000 mg | ORAL_TABLET | Freq: Four times a day (QID) | ORAL | 0 refills | Status: DC | PRN

## 2018-01-26 MED ORDER — ACETAMINOPHEN 325 MG PO TABS: 650.0000 mg | ORAL_TABLET | ORAL | Status: DC | PRN

## 2018-01-26 MED ORDER — COCONUT OIL OIL: 1.0000 "application " | TOPICAL_OIL | 0 refills | Status: DC | PRN

## 2018-01-26 MED ORDER — BENZOCAINE-MENTHOL 20-0.5 % EX AERO: 1.0000 "application " | INHALATION_SPRAY | CUTANEOUS | Status: DC | PRN

## 2018-01-26 MED ORDER — MAGNESIUM OXIDE 400 (241.3 MG) MG PO TABS: 400.0000 mg | ORAL_TABLET | Freq: Every day | ORAL | Status: DC

## 2018-01-26 NOTE — Progress Notes (Signed)
Post Partum Day #2           Information for the patient's newborn:  Jerrica, Thorman [[id number]]  female     circumcision completed Baby name: [name] Feeding: breast  Subjective: No HA, SOB, CP, F/C, breast symptoms. Pain well controlled. Has hemorrhoids and concerned about it. Normal vaginal bleeding, no clots.      Objective:  VS:  Vitals:   01/24/18 1730 01/25/18 0616 01/25/18 1853 01/26/18 0550  BP: 114/72 108/72 109/73 108/70  Pulse: (!) 107 77 80 91  Resp: 18 17 18 17   Temp: 98.9 F (37.2 C) 98.3 F (36.8 C) (!) 97.5 F (36.4 C) 98.4 F (36.9 C)  TempSrc: Oral Oral Oral Oral  SpO2: 96% 99%  100%  Weight:      Height:        No intake or output data in the 24 hours ending 01/26/18 0951    Recent Labs    01/24/18 0705 01/25/18 0554  WBC 10.2 14.8*  HGB 10.8* 8.0*  HCT 33.7* 24.2*  PLT 282 207    Blood type: --/--/A POS (05/17 0700) Rubella: Immune (02/28 0000)    Physical Exam:  General: alert, cooperative and no distress Uterine Fundus: firm Lochia: appropriate Perineum: repair intact, edema small, small indurated hemorrhoid noted DVT Evaluation: No cords or calf tenderness. No significant calf/ankle edema.    Assessment/Plan: PPD # 2 / 27 y.o., [id number] S/P:vacuum extraction   Principal Problem:   VAVD 5/17 Active Problems:   Postpartum care following vaginal delivery   Obstetrical laceration -  2nd degreePerineal; left Labial minora   Maternal anemia, with delivery  -oral Fe and Mag ox started, increased hydration and fiber in diet addressed   normal postpartum exam  Continue current postpartum care             DC home today w/ instructions  F/U at [place] in 6 weeks and PRN   LOS: 2 days   Juliene Pina, CNM, MSN 01/26/2018, 9:51 AM

## 2018-01-26 NOTE — Discharge Summary (Signed)
Obstetric Discharge Summary Reason for Admission: onset of labor Prenatal Procedures: ultrasound Intrapartum Procedures: vacuum and epidural Postpartum Procedures: none Complications-Operative and Postpartum: 2nd degree perineal laceration Hemoglobin  Date Value Ref Range Status  01/25/2018 8.0 (L) 12.0 - 15.0 g/dL Final    Comment:    DELTA CHECK NOTED REPEATED TO VERIFY    HCT  Date Value Ref Range Status  01/25/2018 24.2 (L) 36.0 - 46.0 % Final    Physical Exam:  General: alert, cooperative and no distress Lochia: appropriate Uterine Fundus: firm Incision: healing well DVT Evaluation: No cords or calf tenderness. No significant calf/ankle edema.  Discharge Diagnoses: Term Pregnancy-delivered and maternal anemia  Discharge Information: Date: 01/26/2018 Activity: pelvic rest Diet: routine Medications:  Allergies as of 01/26/2018      Reactions   Amoxicillin Nausea And Vomiting   Has patient had a PCN reaction causing immediate rash, facial/tongue/throat swelling, SOB or lightheadedness with hypotension: no Has patient had a PCN reaction causing severe rash involving mucus membranes or skin necrosis: no Has patient had a PCN reaction that required hospitalization no Has patient had a PCN reaction occurring within the last 10 years: no If all of the above answers are "NO", then may proceed with Cephalosporin use.   Imitrex [sumatriptan]    C/o felt generalized flushing, burning, rhinorrhea within 2-3 min of medication   Latex Itching      Medication List    STOP taking these medications   promethazine 25 MG suppository Commonly known as:  PHENERGAN     TAKE these medications   acetaminophen 325 MG tablet Commonly known as:  TYLENOL Take 2 tablets (650 mg total) by mouth every 4 (four) hours as needed (for pain scale < 4).   benzocaine-Menthol 20-0.5 % Aero Commonly known as:  DERMOPLAST Apply 1 application topically as needed for irritation (perineal  discomfort).   calcium carbonate 500 MG chewable tablet Commonly known as:  TUMS - dosed in mg elemental calcium Chew 1 tablet by mouth 4 (four) times daily as needed for indigestion or heartburn.   coconut oil Oil Apply 1 application topically as needed.   dibucaine 1 % Oint Commonly known as:  NUPERCAINAL Place 1 application rectally as needed for hemorrhoids.   docusate sodium 50 MG capsule Commonly known as:  COLACE Take 1 capsule (50 mg total) by mouth 2 (two) times daily.   ibuprofen 600 MG tablet Commonly known as:  ADVIL,MOTRIN Take 1 tablet (600 mg total) by mouth every 6 (six) hours as needed for moderate pain.   iron polysaccharides 150 MG capsule Commonly known as:  NIFEREX Take 1 capsule (150 mg total) by mouth daily. Start taking on:  01/27/2018   magnesium oxide 400 (241.3 Mg) MG tablet Commonly known as:  MAG-OX Take 1 tablet (400 mg total) by mouth daily. Start taking on:  01/27/2018   polyethylene glycol powder powder Commonly known as:  GLYCOLAX/MIRALAX Take 17 g by mouth 2 (two) times daily.            Discharge Care Instructions  (From admission, onward)        Start     Ordered   01/26/18 0000  Discharge wound care:    Comments:  Sitz baths 2 times /day with warm water x 1 week   01/26/18 1019     Condition: stable Instructions: refer to practice specific booklet Discharge to: home Follow-up Information    Servando Salina, MD. Schedule an appointment as soon as possible for  a visit in 6 week(s).   Specialty:  Obstetrics and Gynecology Contact information: [address] [place] [address] [phone]           Newborn Data: Live born female [name] Birth Weight: 7 lb 10.8 oz (3480 g) APGAR: 6, 9  Newborn Delivery   Birth date/time:  01/24/2018 11:27:00 Delivery type:  Vaginal, Vacuum (Extractor)     Home with mother.  Rebecca Benjamin, CNM 01/26/2018, 10:19 AM

## 2018-01-26 NOTE — Lactation Note (Signed)
This note was copied from a baby's chart. Lactation Consultation Note  Patient Name: Rebecca Benjamin PNTIR'W Date: 01/26/2018 Reason for consult: Follow-up assessment;Primapara;1st time breastfeeding;Term;Nipple pain/trauma;Infant weight loss  Baby is 54 hours old  Baby awake and hungry as LC entered the room , and per mom both nipples sore.  LC offered to assess and mom receptive. LC noted a small intact blister on the right, on the left  Abrasion on the top of the Nipple ( intact), breast both full, not engorged.  LC reviewed hand expressing, and mom repeated it well. Encouraged EBM to nipples liberally to nipples.  LC instructed mom - comfort gels after feedings , alternating with shells except when sleeping, and hand pump. Sore nipple and engorgement prevention and tx reviewed.  Discussed nutritive vs non - nutritive sucking patterns and to watch the baby for hanging out latched.   LC assisted mom with latch to obtain depth on the left breast / cross cradle/ and per mom comfortable.  Baby fed 10 mins , multiple swallows, and when released nipple well rounded.  LC instructed mom with latching on the right cross cradle and she was independent, and achieved comfort.  Multiple swallows. Nipple well rounded. Baby fed for 21 mins. Mom released latch due to baby feeding non - nutritive.  After baby released still acting hungry and mom switched breast and latched baby by her self, depth achieved.  LC praised mom for her breast feeding efforts and for dad / grandmother support.   Mother informed of post-discharge support and given phone number to the lactation department, including services for phone call assistance; out-patient appointments; and breastfeeding support group. List of other breastfeeding resources in the community given in the handout. Encouraged mother to call for problems or concerns related to breastfeeding.  Per mom will have a DEBP - Spectra at home.      Maternal Data Has  patient been taught Hand Expression?: Yes  Feeding Feeding Type: Breast Fed Length of feed: (still feeding , swallows noted )  LATCH Score Latch: Grasps breast easily, tongue down, lips flanged, rhythmical sucking.  Audible Swallowing: Spontaneous and intermittent  Type of Nipple: Everted at rest and after stimulation  Comfort (Breast/Nipple): Filling, red/small blisters or bruises, mild/mod discomfort  Hold (Positioning): No assistance needed to correctly position infant at breast.  LATCH Score: 9  Interventions Interventions: Breast feeding basics reviewed;Assisted with latch;Skin to skin;Breast massage;Hand express;Breast compression;Adjust position;Support pillows;Position options;Shells;Coconut oil;Comfort gels;DEBP;Hand pump  Lactation Tools Discussed/Used Tools: Shells;Pump;Coconut oil;Comfort gels Shell Type: Inverted Breast pump type: Double-Electric Breast Pump;Manual(#24 F good fit for today ) Pump Review: Setup, frequency, and cleaning;Milk Storage Initiated by:: MAI / reviewed  Date initiated:: 01/26/18   Consult Status Consult Status: Complete Date: 01/26/18 Follow-up type: In-patient    [place] 01/26/2018, 9:47 AM

## 2018-02-14 ENCOUNTER — Ambulatory Visit: Payer: Self-pay

## 2018-02-14 NOTE — Lactation Note (Signed)
This note was copied from a baby's chart. 02/14/2018  Name: Rebecca Benjamin MRN: [id number] Date of Birth: 01/24/2020 Gestational Age: Gestational Age: [redacted]w[redacted]d Birth Weight: 122.8 oz Weight today:    9 pounds 11.1 ounces (4396 grams)   Infant has gained 1136 grams in the last 19 days with an average daily weight gain of 60 grams a day.   Infant presents today with mom and dad for feeding assessment. Mom is concerned infant is fussy, gassy and is spitting large amounts at times, maybe once every few days. Picture was seen and it looked like partially digested breast milk. Reviewed frequent burping and keeping infant upright for 15-20 minutes post feeding.   Infant feeds every 30  Minutes to 1.5 hours and feeds for 11-32 minutes. Mom feeds on one or both breasts with each feeding depending on what infant wants. Enc mom to empty one breast before offering second breast. Reviewed supply and demand and fore milk/hindmilk imbalance.   Mom is pumping as she is concerned with decreased milk supply. Enc mom to wean off pumping some to regulate supply and to monitor voids, stools and infant weight. Mom is concerned one breast makes more than the other, discussed this is normal for most mom's. Mom with nipple pain with initial latch that improves, infant wants to latch shallowly and needs lips flanged as mom did independently. Enc mom to wait for wide open mouth for latch.   Mom latched infant to the right breast in the cross cradle hold. He fed for 11 minutes and transferred 54 ml. Infant with dimpling with feeding, did not correct with lip flanging. Mom without pain after initial latch. Infant does have deep dimples when smiling. Nipple rounded post BF. Infant burped and then fed on the left breast for about 5 minutes and transferred 36 ml. Infant fell asleep post feeding. Mom pumped post feeding for about 5 minutes with a Spectra and obtained about 20 ml.   Parents needed a lot of reassurance with BF and  infant care although they are doing very well. Enc mom to attend BF Support Groups.   Infant to follow up with Dr. Sabra Heck on Friday June 14. Infant to follow up with Lactation as needed. Mom aware of BF Support Groups on Tuesday Mornings. Mom to call with any questions/concerns as needed.      General Information: Mother's reason for visit: concerned with infant fussiness and gassiness, is he getting enough Consult: Initial Lactation consultant: Nonah Mattes RN,IBCLC Breastfeeding experience: Feeding every 30 minutes to 1.5 hours   Maternal medications: Other(Mother's Milk Tea)  Breastfeeding History: Frequency of breast feeding: every 30 minutes to 1.5 hours Duration of feeding: 11-32 minutes  Supplementation: Supplement method: bottle(once with NUK nipple)               Pump type: Spectra Pump frequency: 4-5 x a day Pump volume: 2.5 ounces  Infant Output Assessment: Voids per 24 hours: 8+ Urine color: Clear yellow Stools per 24 hours: 8+ Stool color: Yellow  Breast Assessment: Breast: Filling Nipple: Erect Pain level: 4(with latching, improves with feeding) Pain interventions: Bra, Expressed breast milk  Feeding Assessment: Infant oral assessment: WNL   Positioning: Cross cradle(right breast) Latch: 2 - Grasps breast easily, tongue down, lips flanged, rhythmical sucking. Audible swallowing: 2 - Spontaneous and intermittent Type of nipple: 2 - Everted at rest and after stimulation Comfort: 2 - Soft/non-tender Hold: 2 - No assistance needed to correctly position infant at breast LATCH score: 10  Latch assessment: Deep Lips flanged: No(mom flanged lips with feeding ) Suck assessment: Displays both   Pre-feed weight: 4396 grams Post feed weight: 4448 grams Amount transferred: 54 ml Amount supplemented: 0  Additional Feeding Assessment: Infant oral assessment: WNL   Positioning: Cross cradle(left breast) Latch: 2 - Grasps breast easily, tongue down, lips  flanged, rhythmical sucking. Audible swallowing: 2 - Spontaneous and intermittent Type of nipple: 2 - Everted at rest and after stimulation Comfort: 2 - Soft/non-tender Hold: 2 - No assistance needed to correctly position infant at breast LATCH score: 10 Latch assessment: Deep Lips flanged: Yes Suck assessment: Displays both   Pre-feed weight: 4448 grams Post feed weight: 4484 grams Amount transferred: 36 ml Amount supplemented: 0  Totals: Total amount transferred: 90 ml Total supplement given: 0 Total amount pumped post feed: 20   Plan: 1. Continue to feed infant with feeding cues 2. Empty one breast before offering second breast.  3. Burp infant well after feeding 4. Keep infant upright after feeding 5. Consider trying to wean off pumping by dropping 2-3 minutes with every pumping session about every 3 days until weaned off-Look up [email or web address] (Weaning from the pump) 6. Offer infant bottle between 4-6 weeks about once or twice a week 7. Use a newborn slow flow nipple with the PACE bottle feeding method ([email or web address].[email or web address]) 8. [place] needs 83-110 ml (3-3.5 ounces) for 8 feedings a day and 660-880 ml (22-29 ounces) a day. 9. Keep up with good work 10. Call with any questions/concerns as needed 11. Thank you for allowing me to assist you today 12. Follow up with Lactation as needed   Donn Pierini RN, IBCLC                                                      Debby Freiberg Kaan Tosh 02/14/2018, 3:23 PM

## 2018-03-18 IMAGING — US US OB COMP LESS 14 WK
1 series · 15 of 28 positions shown · non-contrast
Comparison: None.

CLINICAL DATA: Vaginal bleeding and cramping

EXAM:
OBSTETRIC <14 WK US AND TRANSVAGINAL OB US
TECHNIQUE: Both transabdominal and transvaginal ultrasound examinations were
performed for complete evaluation of the gestation as well as the
maternal uterus, adnexal regions, and pelvic cul-de-sac.
Transvaginal technique was performed to assess early pregnancy.

[Series 1: us ob comp less 14 wk · 15 of 62 slices shown]
[im 1/62]
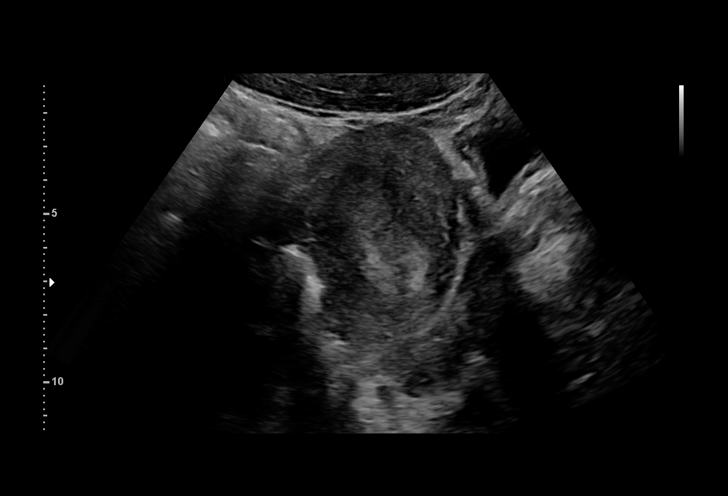
[im 5/62]
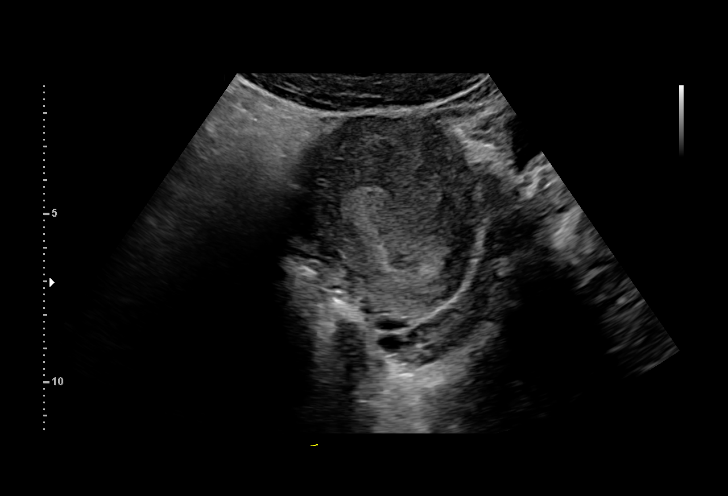
[im 10/62]
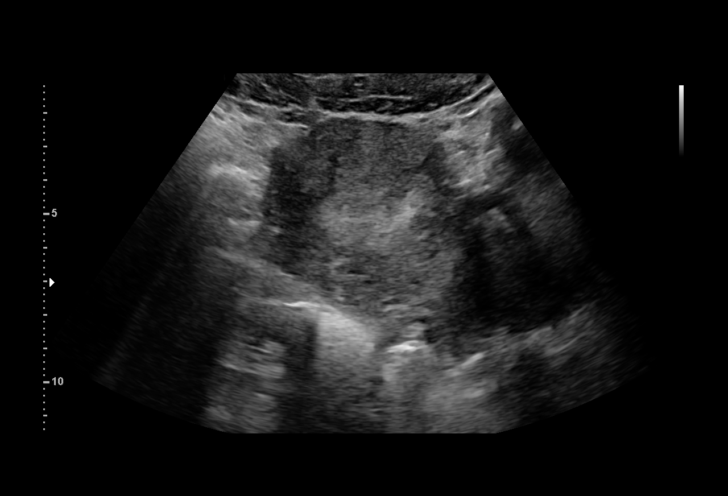
[im 14/62]
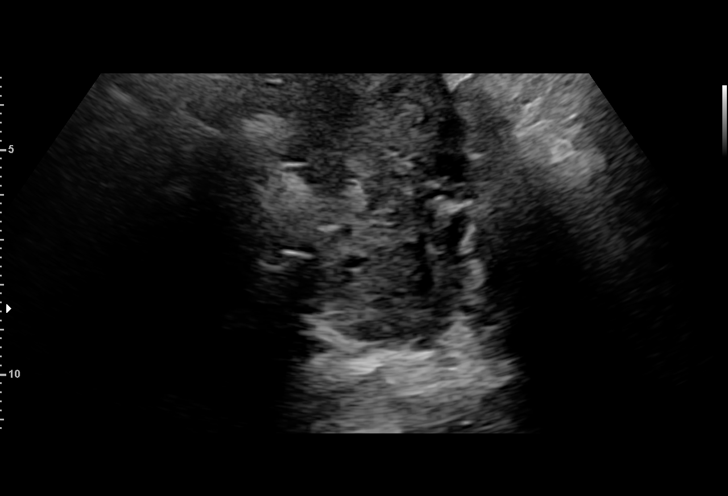
[im 19/62]
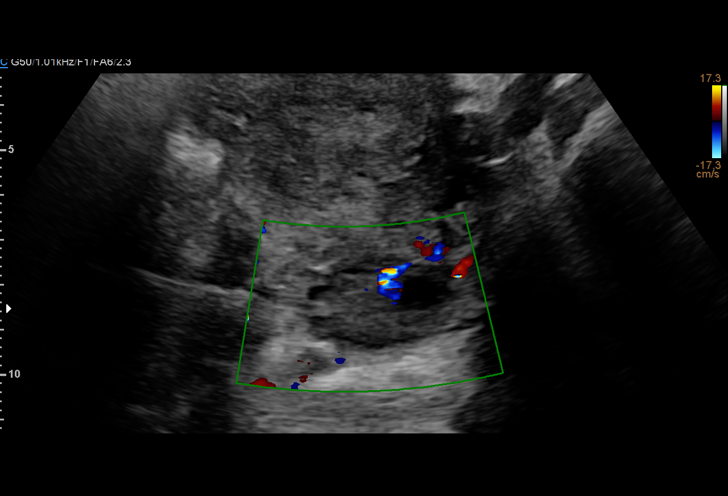
[im 23/62]
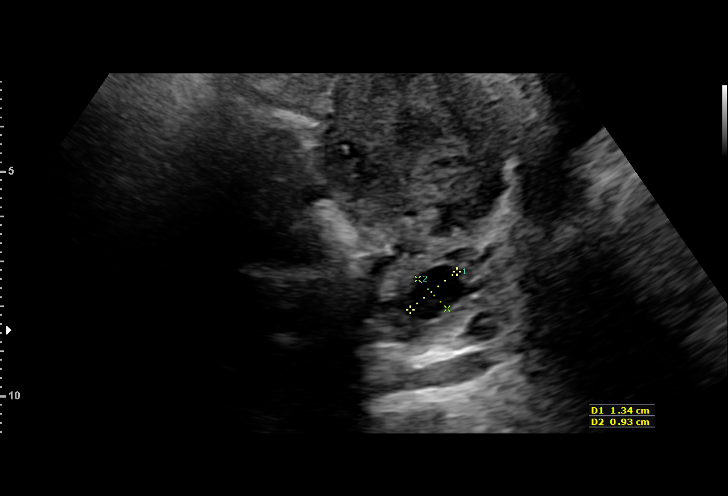
[im 28/62]
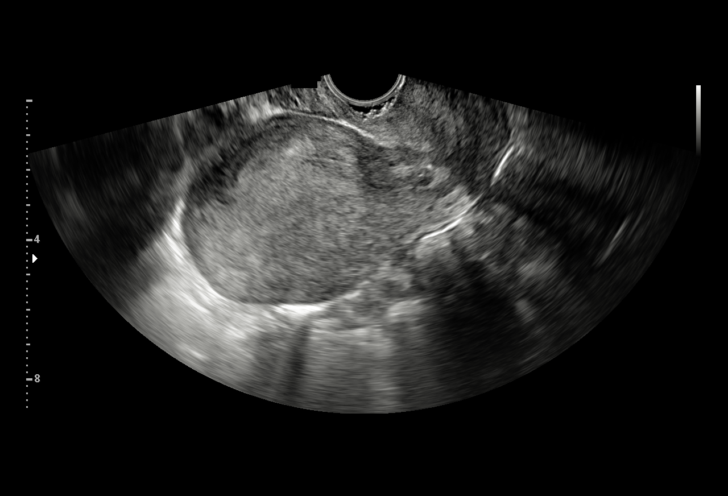
[im 32/62]
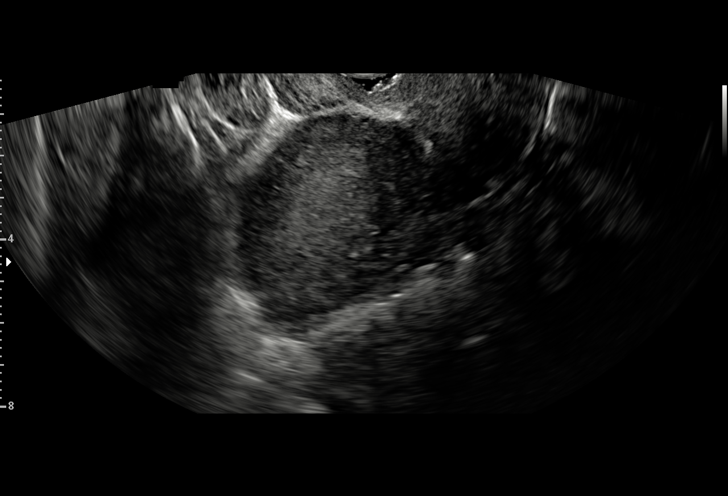
[im 34/62]
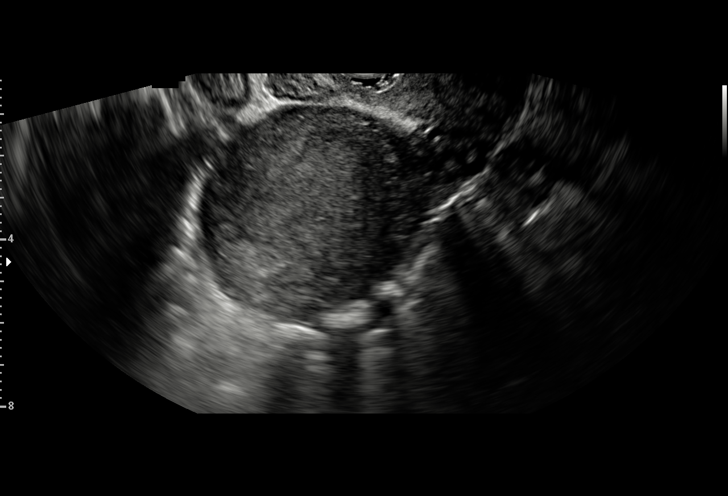
[im 39/62]
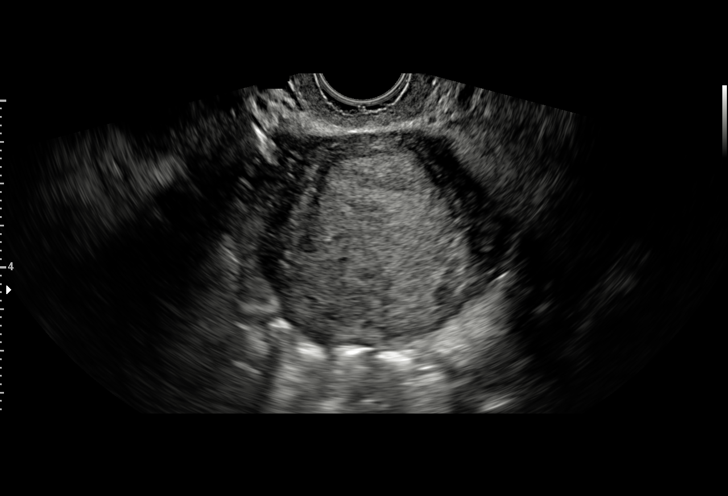
[im 43/62]
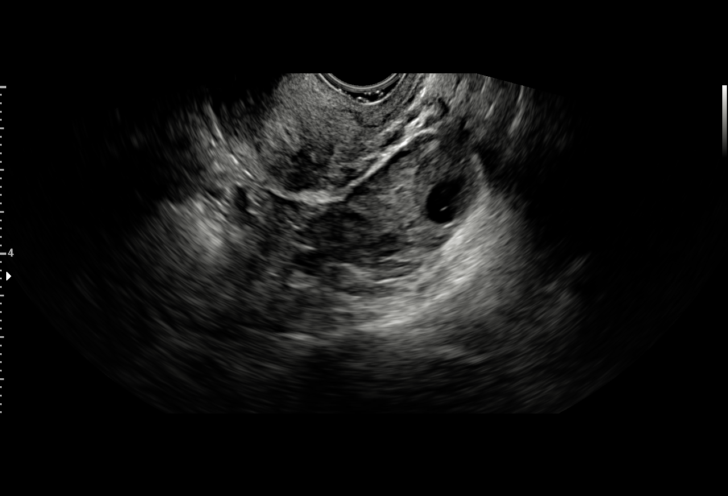
[im 48/62]
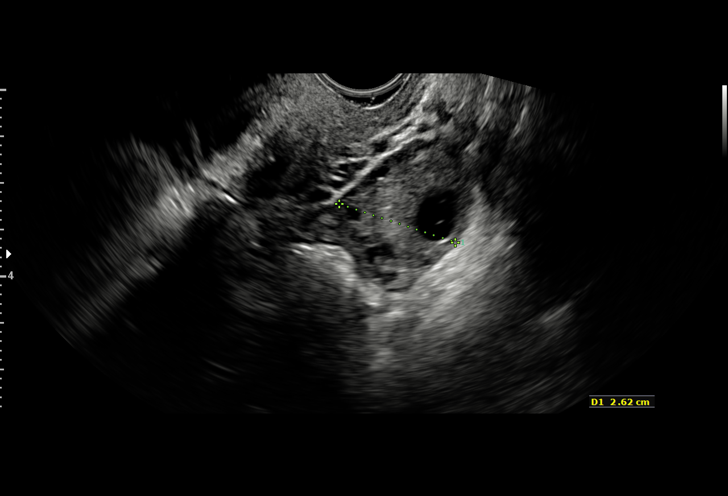
[im 52/62]
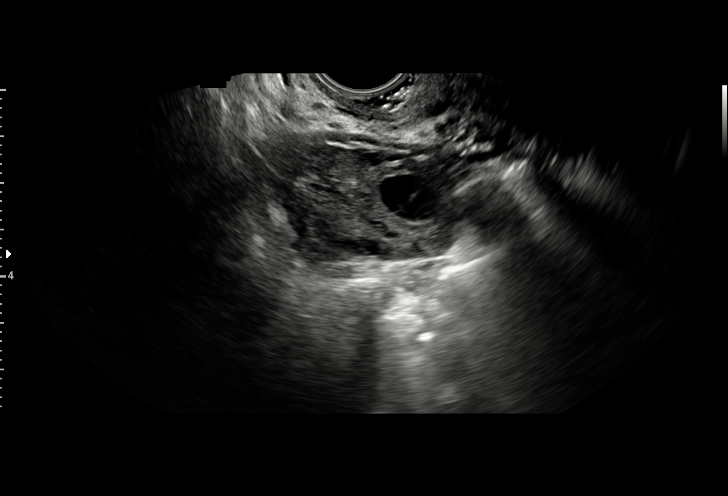
[im 57/62]
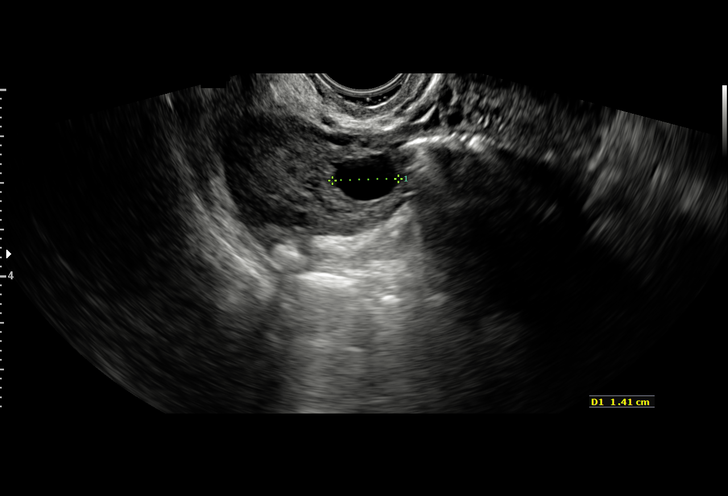
[im 62/62]
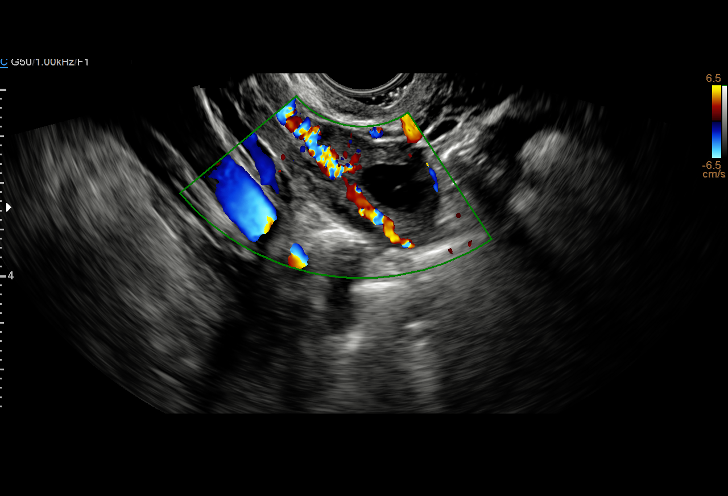

[15 of 28 positions shown; findings below may reference images not displayed]

FINDINGS: Intrauterine gestational sac: Not visualized

Yolk sac:  Not visualized

Embryo:  Not visualized

Cardiac Activity: Not visualized

Subchorionic hemorrhage:  None visualized.

Maternal uterus/adnexae: Uterus measures 8.3 x 5.3 x 5.4 cm. No
intrauterine mass. Endometrium measures 20 mm with a smooth contour.
Left ovary measures 3.0 x 2.5 x 3.4 cm. Right ovary measures 3.2 x
2.4 x 3.1 cm. There are follicles in each ovary. Trace free pelvic
fluid.
IMPRESSION: No intrauterine gestation is evident. Currently beta HCG value is
pending. Assuming positive beta HCG value, differential
considerations for this finding include intrauterine gestation too
early to be seen by either transabdominal or transvaginal technique,
recent spontaneous abortion, or possible ectopic gestation. Given
this circumstance. Close clinical and laboratory surveillance
advised. Timing of repeat ultrasound in part will depend on beta HCG
values going forward. Trace free pelvic fluid may be physiologic.
Endometrium is somewhat thickened if beta HCG value returns
negative.

## 2018-03-20 IMAGING — US US OB TRANSVAGINAL
1 series · 15 of 28 positions shown · non-contrast
Comparison: Obstetric ultrasound October 15, 2016

CLINICAL DATA: Spotting and cramping. Follow-up evaluation. Beta
HCG [DATE]. Gestational age by last menstrual period 5 weeks and 2
days.

EXAM:
TRANSVAGINAL OB ULTRASOUND
TECHNIQUE: Transvaginal ultrasound was performed for complete evaluation of the
gestation as well as the maternal uterus, adnexal regions, and
pelvic cul-de-sac.

[Series 1: us ob transvaginal · 47 acquisitions, 15 frames shown]
[im 1/47]
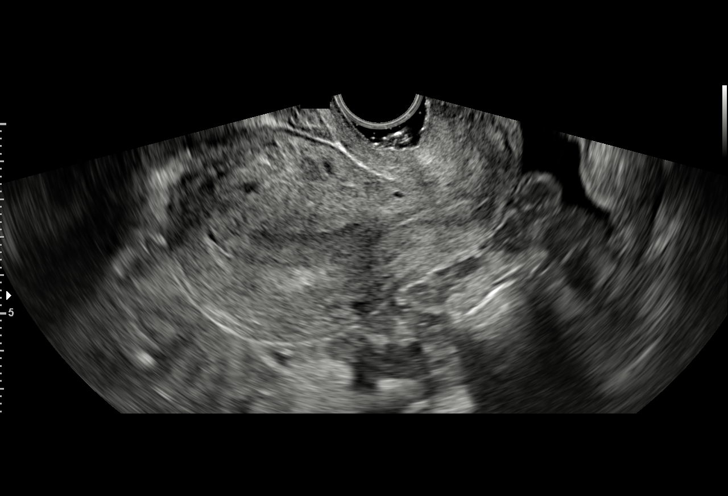
[im 4/47]
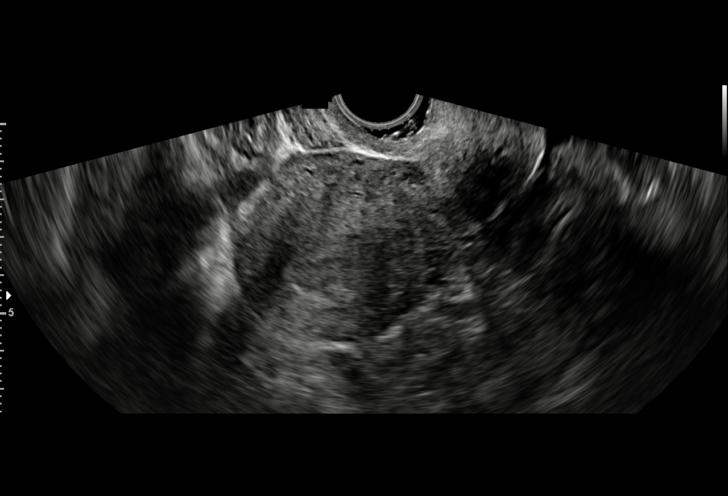
[im 7/47]
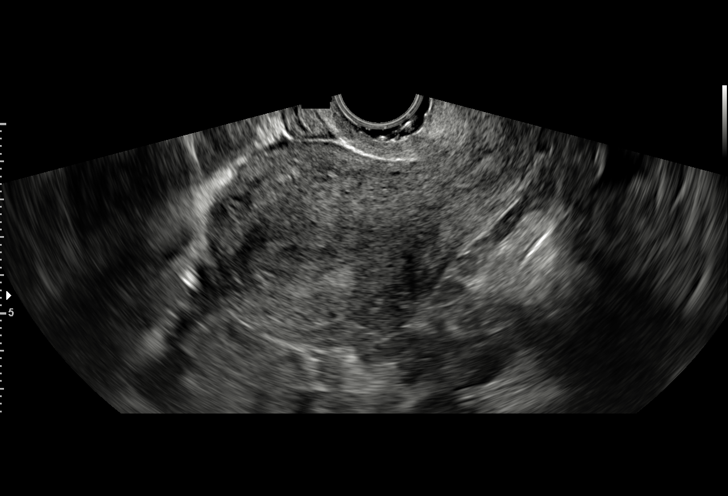
[im 11/47]
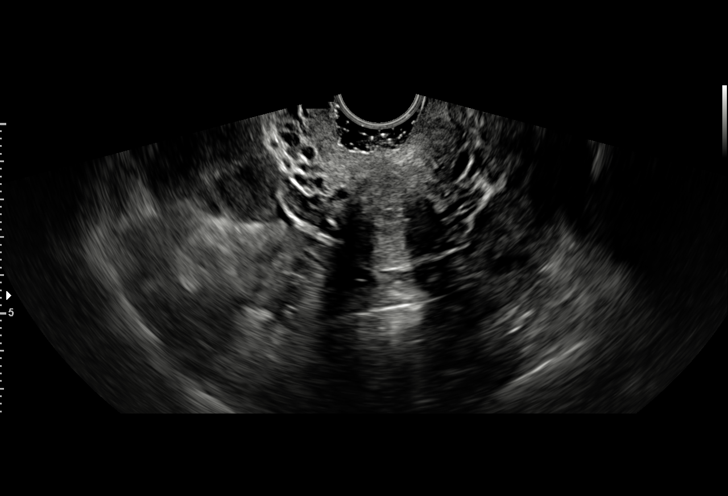
[im 14/47]
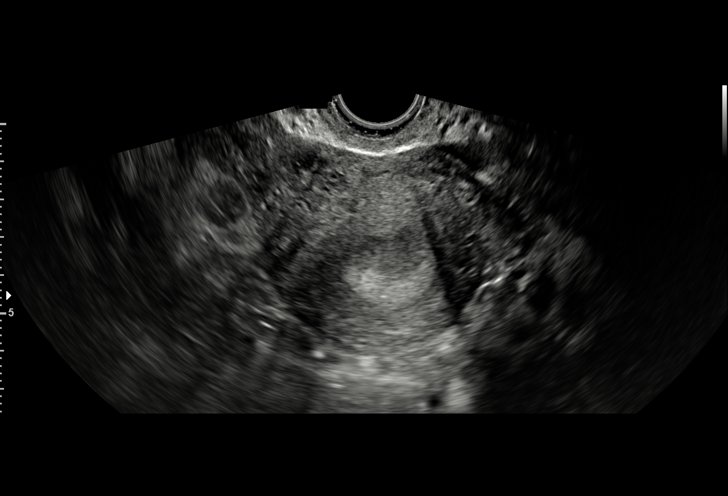
[im 18/47]
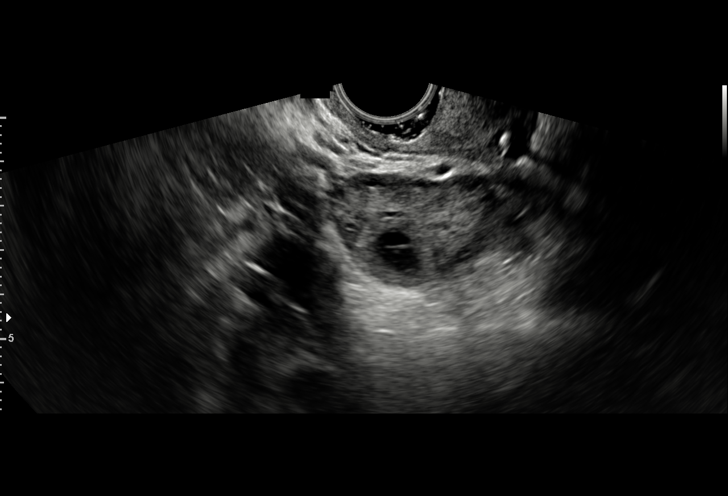
[im 21/47]
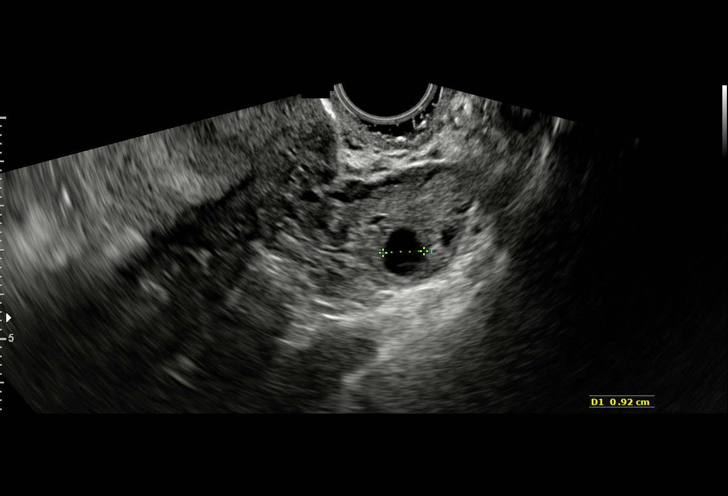
[im 24/47]
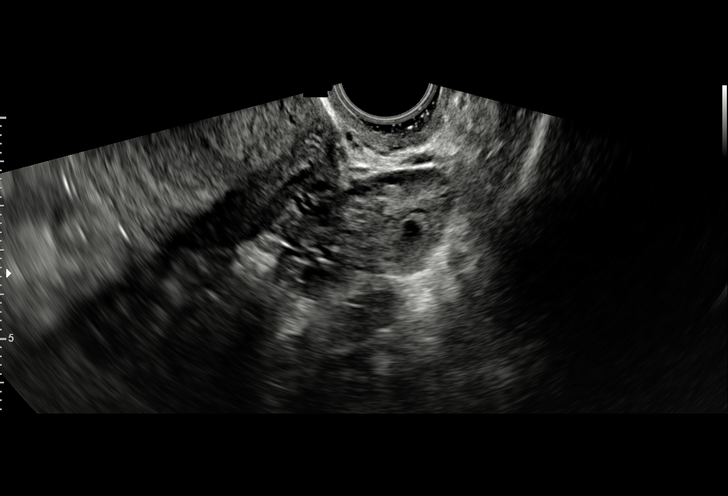
[im 26/47]
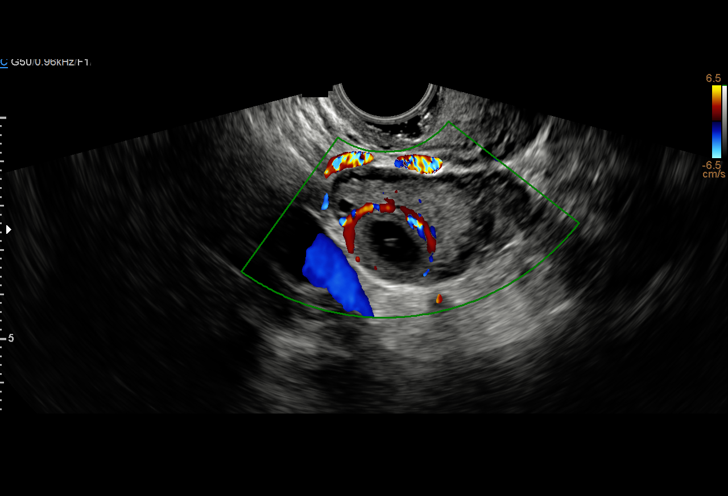
[im 29/47]
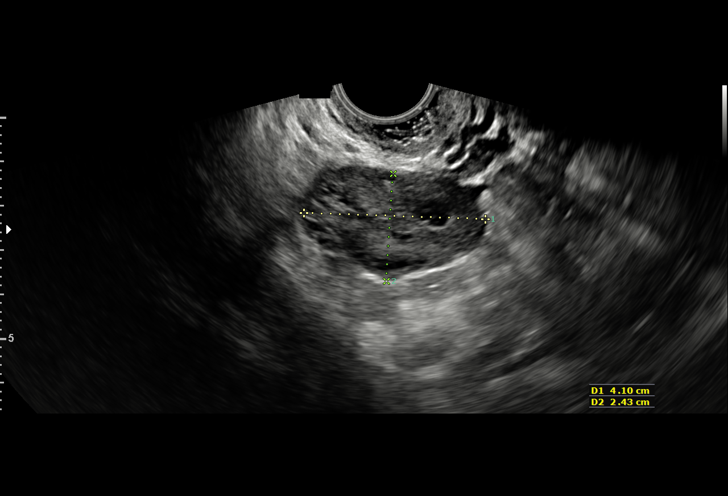
[im 33/47]
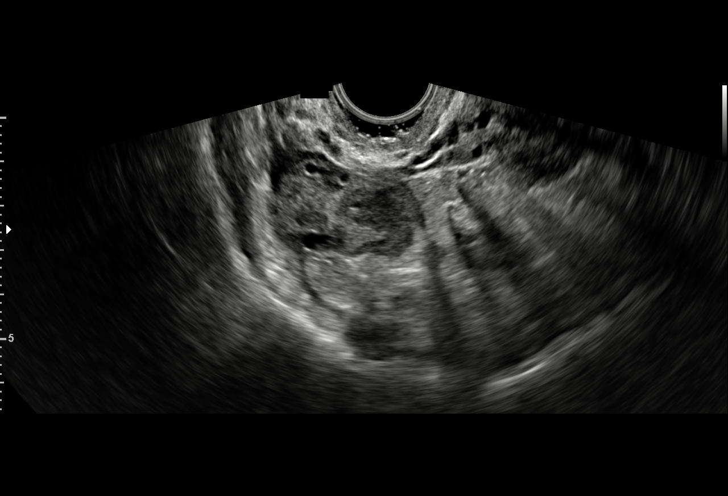
[im 36/47]
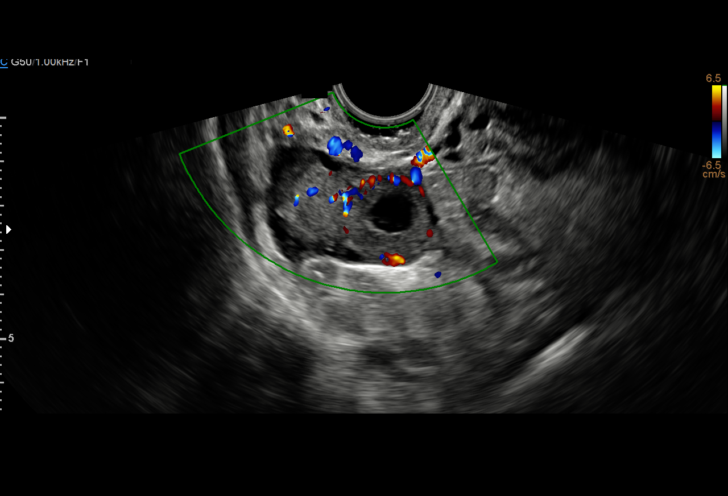
[im 40/47]
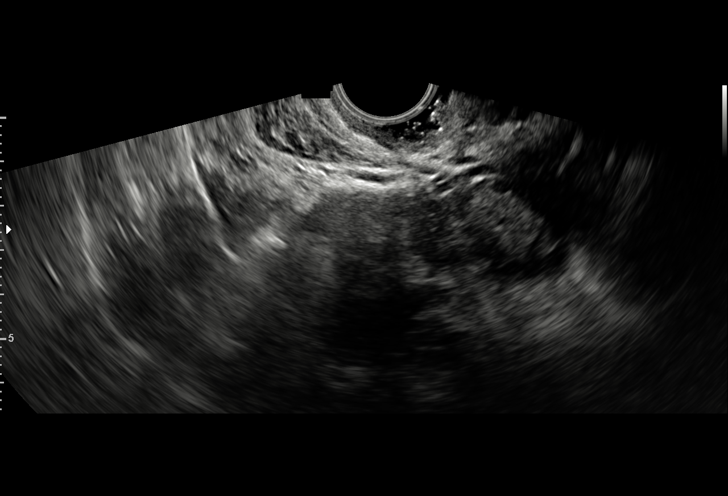
[im 43/47]
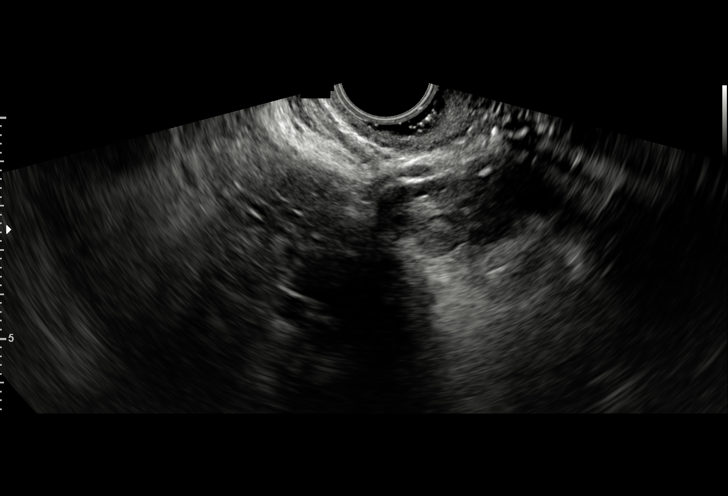
[im 47/47]
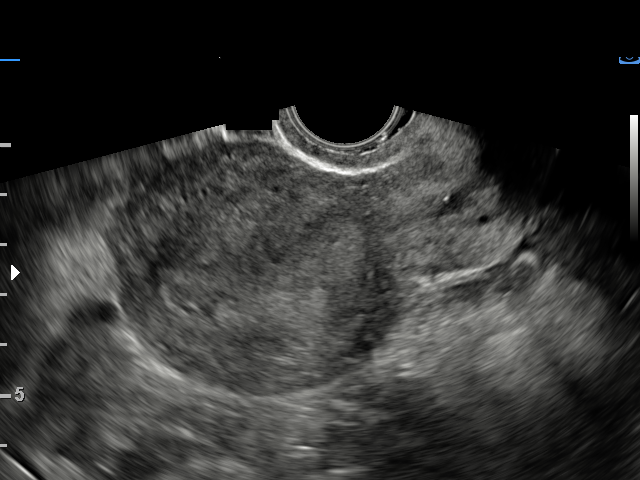

[15 of 28 positions shown; findings below may reference images not displayed]

FINDINGS: Intrauterine gestational sac: None.

Yolk sac:  None.

Embryo:  None.

Cardiac Activity: None.

Subchorionic hemorrhage:  None visualized.

Maternal uterus/adnexae: 18 mm LEFT and 1.7 cm RIGHT thick-walled
cyst with peripheral vascularity, within the ovaries. No para
ovarian mass. Small amount of free fluid in the pelvis.
IMPRESSION: No sonographically identified pregnancy. This represents a pregnancy
of unknown location. Please note, pregnancy may not be identified
with beta HCG less than [DATE].

Small bilateral ovarian cysts .

## 2018-07-12 ENCOUNTER — Encounter (HOSPITAL_BASED_OUTPATIENT_CLINIC_OR_DEPARTMENT_OTHER): Payer: Self-pay | Admitting: Emergency Medicine

## 2018-07-12 ENCOUNTER — Emergency Department (HOSPITAL_BASED_OUTPATIENT_CLINIC_OR_DEPARTMENT_OTHER)
Admission: EM | Admit: 2018-07-12 | Discharge: 2018-07-13 | Disposition: A | Discharge status: Home / Self Care | Payer: BLUE CROSS/BLUE SHIELD | Attending: Emergency Medicine | Admitting: Emergency Medicine

## 2018-07-12 ENCOUNTER — Other Ambulatory Visit: Payer: Self-pay

## 2018-07-12 ENCOUNTER — Emergency Department (HOSPITAL_COMMUNITY): Payer: BLUE CROSS/BLUE SHIELD

## 2018-07-12 DIAGNOSIS — R51 Headache: Secondary | ICD-10-CM | POA: Diagnosis present

## 2018-07-12 DIAGNOSIS — R42 Dizziness and giddiness: Secondary | ICD-10-CM | POA: Insufficient documentation

## 2018-07-12 DIAGNOSIS — R29898 Other symptoms and signs involving the musculoskeletal system: Secondary | ICD-10-CM | POA: Diagnosis not present

## 2018-07-12 DIAGNOSIS — R519 Headache, unspecified: Secondary | ICD-10-CM

## 2018-07-12 LAB — COMPREHENSIVE METABOLIC PANEL
ALBUMIN: 4 g/dL (ref 3.5–5.0)
ALT: 12 U/L (ref 0–44)
AST: 17 U/L (ref 15–41)
Alkaline Phosphatase: 72 U/L (ref 38–126)
Anion gap: 8 (ref 5–15)
BUN: 17 mg/dL (ref 6–20)
CHLORIDE: 105 mmol/L (ref 98–111)
CO2: 28 mmol/L (ref 22–32)
CREATININE: 0.62 mg/dL (ref 0.44–1.00)
Calcium: 8.9 mg/dL (ref 8.9–10.3)
GFR calc Af Amer: >60 mL/min (ref >60)
GFR calc non Af Amer: >60 mL/min (ref >60)
GLUCOSE: 89 mg/dL (ref 70–99)
POTASSIUM: 3 mmol/L — AB (ref 3.5–5.1)
SODIUM: 141 mmol/L (ref 135–145)
Total Bilirubin: 1 mg/dL (ref 0.3–1.2)
Total Protein: 7.2 g/dL (ref 6.5–8.1)

## 2018-07-12 LAB — CBC WITH DIFFERENTIAL/PLATELET
Abs Immature Granulocytes: 0.01 10*3/uL (ref 0.00–0.07)
Basophils Absolute: 0 10*3/uL (ref 0.0–0.1)
Basophils Relative: 0 %
EOS PCT: 1 %
Eosinophils Absolute: 0.1 10*3/uL (ref 0.0–0.5)
HEMATOCRIT: 37 % (ref 36.0–46.0)
HEMOGLOBIN: 11.9 g/dL — AB (ref 12.0–15.0)
Immature Granulocytes: 0 %
LYMPHS ABS: 1.8 10*3/uL (ref 0.7–4.0)
LYMPHS PCT: 28 %
MCH: 29.2 pg (ref 26.0–34.0)
MCHC: 32.2 g/dL (ref 30.0–36.0)
MCV: 90.7 fL (ref 80.0–100.0)
MONO ABS: 0.3 10*3/uL (ref 0.1–1.0)
Monocytes Relative: 5 %
Neutro Abs: 4.1 10*3/uL (ref 1.7–7.7)
Neutrophils Relative %: 66 %
Platelets: 250 10*3/uL (ref 150–400)
RBC: 4.08 MIL/uL (ref 3.87–5.11)
RDW: 13.9 % (ref 11.5–15.5)
WBC: 6.3 10*3/uL (ref 4.0–10.5)
nRBC: 0 % (ref 0.0–0.2)

## 2018-07-12 LAB — URINALYSIS, ROUTINE W REFLEX MICROSCOPIC
Bilirubin Urine: NEGATIVE
GLUCOSE, UA: NEGATIVE mg/dL
Hgb urine dipstick: NEGATIVE
KETONES UR: 15 mg/dL — AB
LEUKOCYTES UA: NEGATIVE
NITRITE: NEGATIVE
PH: 6.5 (ref 5.0–8.0)
PROTEIN: 30 mg/dL — AB
Specific Gravity, Urine: 1.025 (ref 1.005–1.030)

## 2018-07-12 LAB — URINALYSIS, MICROSCOPIC (REFLEX)

## 2018-07-12 LAB — TSH: TSH: 0.768 u[IU]/mL (ref 0.350–4.500)

## 2018-07-12 LAB — PREGNANCY, URINE: Preg Test, Ur: NEGATIVE

## 2018-07-12 LAB — SEDIMENTATION RATE: SED RATE: 3 mm/h (ref 0–22)

## 2018-07-12 MED ORDER — DIPHENHYDRAMINE HCL 50 MG/ML IJ SOLN
25.0000 mg | Freq: Once | INTRAMUSCULAR | Status: AC
  Administered 2018-07-12: 25 mg via INTRAVENOUS
  Filled 2018-07-12: qty 1

## 2018-07-12 MED ORDER — POTASSIUM CHLORIDE CRYS ER 20 MEQ PO TBCR
40.0000 meq | EXTENDED_RELEASE_TABLET | Freq: Once | ORAL | Status: AC
  Administered 2018-07-12: 40 meq via ORAL
  Filled 2018-07-12: qty 2

## 2018-07-12 MED ORDER — MECLIZINE HCL 25 MG PO TABS
25.0000 mg | ORAL_TABLET | Freq: Once | ORAL | Status: AC
  Administered 2018-07-12: 25 mg via ORAL
  Filled 2018-07-12: qty 1

## 2018-07-12 MED ORDER — PROMETHAZINE HCL 25 MG/ML IJ SOLN
12.5000 mg | Freq: Once | INTRAMUSCULAR | Status: AC
  Administered 2018-07-13: 12.5 mg via INTRAVENOUS
  Filled 2018-07-12: qty 1

## 2018-07-12 MED ORDER — SODIUM CHLORIDE 0.9 % IV BOLUS
1000.0000 mL | Freq: Once | INTRAVENOUS | Status: AC
  Administered 2018-07-12: 1000 mL via INTRAVENOUS

## 2018-07-12 MED ORDER — SODIUM CHLORIDE 0.9 % IV SOLN
1000.0000 mL | INTRAVENOUS | Status: DC
  Administered 2018-07-12: 1000 mL via INTRAVENOUS

## 2018-07-12 MED ORDER — GADOBUTROL 1 MMOL/ML IV SOLN
5.0000 mL | Freq: Once | INTRAVENOUS | Status: AC | PRN
  Administered 2018-07-12: 5 mL via INTRAVENOUS

## 2018-07-12 MED ORDER — KETOROLAC TROMETHAMINE 30 MG/ML IJ SOLN
30.0000 mg | Freq: Once | INTRAMUSCULAR | Status: AC
  Administered 2018-07-12: 30 mg via INTRAVENOUS
  Filled 2018-07-12: qty 1

## 2018-07-12 MED ORDER — SODIUM CHLORIDE 0.9 % IV BOLUS (SEPSIS)
1000.0000 mL | Freq: Once | INTRAVENOUS | Status: AC
  Administered 2018-07-12: 1000 mL via INTRAVENOUS

## 2018-07-12 MED ORDER — METOCLOPRAMIDE HCL 5 MG/ML IJ SOLN
10.0000 mg | Freq: Once | INTRAMUSCULAR | Status: AC
  Administered 2018-07-12: 10 mg via INTRAVENOUS
  Filled 2018-07-12: qty 2

## 2018-07-12 NOTE — ED Notes (Signed)
Pt on cardiac monitor and auto VS 

## 2018-07-12 NOTE — ED Provider Notes (Addendum)
[facility] EMERGENCY DEPARTMENT Provider Note   CSN: [id number] Arrival date & time: 07/12/18  1434     History   Chief Complaint Chief Complaint  Patient presents with  . Dizziness  . Headache    HPI Rebecca Benjamin is a 27 y.o. female.  HPI Patient is 5 months postpartum and breast-feeding.  She reports that she has had a combination of a pressure headache behind her forehead eyes temples and cheeks and dizziness that had been waxing and waning starting about 2 weeks ago.  She reports that she would get sudden episodes of feeling like the room was spinning and she was off balance.  It was resolving spontaneously.  She assumed that she had a sinus infection.  She did not seek any specific treatment.  She reports however over the past week now the dizziness has become persistent and anytime she moves her head she feels like things are rotating.  She also reports even if she keeps still is improved but she still has a sense of things rocking back and forth.  Ports she has slight blurring of the vision.  No double vision or loss of vision.  She has not had nausea or vomiting.  No fever.  She reports that she has a history of migraine headaches which usually respond to naproxen.  She reports however her headaches have never had dizziness or vertigo associated with them.  She also reports that she is never had weakness or numbness associated with her headaches.  Patient reports that she had not perceived right-sided weakness until we did strength testing.  When we did strength testing patient perceived that she had increased strength on her right upper and lower extremity.  This was also objectively present in testing.  Reports she has not had falls or gait instability. Past Medical History:  Diagnosis Date  . Ectopic pregnancy   . H/O cold sores   . Headache    migraines     Patient Active Problem List   Diagnosis Date Noted  . 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 Obstetrical laceration -   2nd degreePerineal; left Labial minora 01/25/2018  . Maternal anemia, with delivery 01/25/2018  . Postpartum care following vaginal delivery 01/24/2018  . Ectopic pregnancy 10/2016 01/02/2017  . Cluster headache, not intractable 01/11/2016  . 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 DILATION AND CURETTAGE OF UTERUS    . DILATION AND EVACUATION N/A 10/26/2016   Procedure: DILATATION AND EVACUATION;  Surgeon: Crawford Givens, MD;  Location: [facility];  Service: Gynecology;  Laterality: N/A;  . FOOT SURGERY Left 2014 and 2015  . LAPAROSCOPY Left 10/26/2016   Procedure: LAPAROSCOPY OPERATIVE;  Surgeon: Crawford Givens, MD;  Location: [facility];  Service: Gynecology;  Laterality: Left;  removal of ectopic pregnancy   . TONSILLECTOMY       OB History    Gravida  3   Para      Term      Preterm      AB  2   Living  0     SAB  1   TAB  0   Ectopic  1   Multiple      Live Births               Home Medications    Prior to Admission medications   Medication Sig Start Date End Date Taking? Authorizing Provider  acetaminophen (  TYLENOL) 325 MG tablet Take 2 tablets (650 mg total) by mouth every 4 (four) hours as needed (for pain scale < 4). 01/26/18   Juliene Pina, CNM  benzocaine-Menthol (DERMOPLAST) 20-0.5 % AERO Apply 1 application topically as needed for irritation (perineal discomfort). 01/26/18   Juliene Pina, CNM  calcium carbonate (TUMS - DOSED IN MG ELEMENTAL CALCIUM) 500 MG chewable tablet Chew 1 tablet by mouth 4 (four) times daily as needed for indigestion or heartburn.    [provider]  coconut oil OIL Apply 1 application topically as needed. 01/26/18   Juliene Pina, CNM  dibucaine (NUPERCAINAL) 1 % OINT Place 1 application rectally as needed for hemorrhoids. 01/26/18   Juliene Pina, CNM  docusate sodium (COLACE) 50 MG capsule Take 1 capsule (50 mg total) by mouth 2 (two) times  daily. Patient not taking: Reported on 01/24/2018 08/05/17   Prothero, Pleas Koch, CNM  ibuprofen (ADVIL,MOTRIN) 600 MG tablet Take 1 tablet (600 mg total) by mouth every 6 (six) hours as needed for moderate pain. 01/26/18   Juliene Pina, CNM  iron polysaccharides (NIFEREX) 150 MG capsule Take 1 capsule (150 mg total) by mouth daily. 01/27/18   Juliene Pina, CNM  magnesium oxide (MAG-OX) 400 (241.3 Mg) MG tablet Take 1 tablet (400 mg total) by mouth daily. 01/27/18   Juliene Pina, CNM  polyethylene glycol powder (GLYCOLAX/MIRALAX) powder Take 17 g by mouth 2 (two) times daily. Patient not taking: Reported on 01/24/2018 06/17/17   Katheren Shams, DO    Family History Family History  Problem Relation Age of Onset  . Mitral valve prolapse Mother   . Diabetes Mother   . Depression Mother   . Hypertension Mother   . Healthy Father   . Stroke Maternal Grandmother   . Cancer Maternal Grandfather   . Migraines Paternal Grandmother     Social History Social History   Tobacco Use  . Smoking status: Never Smoker  . Smokeless tobacco: Never Used  Substance Use Topics  . Alcohol use: Yes    Comment: Socially  . Drug use: No     Allergies   Amoxicillin; Imitrex [sumatriptan]; and Latex   Review of Systems Review of Systems 10 Systems reviewed and are negative for acute change except as noted in the HPI.   Physical Exam Updated Vital Signs BP (!) 137/99 (BP Location: Left Arm)   Pulse 74   Temp 98.3 F (36.8 C) (Oral)   Resp 18   Ht 5\' 5"  (1.651 m)   Wt 57.6 kg   SpO2 100%   BMI 21.13 kg/m   Physical Exam  Constitutional: She appears well-developed and well-nourished. No distress.  Patient is physically well-nourished and well-developed.  No acute distress.  No respiratory distress  HENT:  Head: Normocephalic and atraumatic.  biLateral TMs normal.  Posterior oropharynx widely patent.  Dentition normal in excellent condition.  Nares patent.  No facial swelling.  No  percussion tenderness over sinuses.  Neck supple without meningismus.  Eyes: Pupils are equal, round, and reactive to light. EOM are normal.  Neck: Neck supple.  Cardiovascular: Normal rate, regular rhythm, normal heart sounds and intact distal pulses.  Pulmonary/Chest: Effort normal and breath sounds normal.  Abdominal: Soft. Bowel sounds are normal. She exhibits no distension. There is no tenderness. There is no guarding.  Musculoskeletal: Normal range of motion. She exhibits no edema or tenderness.  No peripheral edema.  Calves are soft and nontender.  Normal muscle bulk.  Neurological:  Cranial nerves II through XII intact.  Normal extraocular motions.  Finger-nose intact.  Motor strength testing right upper extremity 4\5 for push pull, left upper extremity 5\5 push pull.  Right lower extremity, patient has some increased difficulty to elevate off of the bed and can hold against resistance but fatigues.  Left lower extremity has normal and brisk muscular strength.  Sensation intact to light touch x4 extremities.  Skin: Skin is warm and dry.  Psychiatric: She has a normal mood and affect.     ED Treatments / Results  Labs (all labs ordered are listed, but only abnormal results are displayed) Labs Reviewed  PREGNANCY, URINE  COMPREHENSIVE METABOLIC PANEL  CBC WITH DIFFERENTIAL/PLATELET  SEDIMENTATION RATE  TSH  URINALYSIS, ROUTINE W REFLEX MICROSCOPIC    EKG None  Radiology No results found.  Procedures Procedures (including critical care time)  Medications Ordered in ED Medications  metoCLOPramide (REGLAN) injection 10 mg (has no administration in time range)  diphenhydrAMINE (BENADRYL) injection 25 mg (has no administration in time range)  ketorolac (TORADOL) 30 MG/ML injection 30 mg (has no administration in time range)  sodium chloride 0.9 % bolus 1,000 mL (has no administration in time range)    Followed by  0.9 %  sodium chloride infusion (has no administration in  time range)     Initial Impression / Assessment and Plan / ED Course  I have reviewed the triage vital signs and the nursing notes.  Pertinent labs & imaging results that were available during my care of the patient were reviewed by me and considered in my medical decision making (see chart for details).  Clinical Course as of Jul 14 127  Sat Jul 12, 2018  1652 Consult: Reviewed with Dr. Leonel Ramsay neurology.  Advises that if patient got complete relief of symptoms with migraine therapy, would not require imaging.  If symptoms are still present, patient will need MRI with and without contrast and MRV.   [MP]    Clinical Course User Index [MP] Charlesetta Shanks, MD   Patient presents with chief complaint of vertiginous type dizziness and headache.  She states "sinus infection".  Patient however does not have objective signs of sinusitis.  There symptoms consistent with peripheral vertigo associated with recent URI\sinus symptoms.  However, on motor testing patient exhibits weakness on the right upper and lower extremity.  Patient's first onset of dizziness and headache are between 1 to 2 weeks ago.  Patient does have history of migraines and complex migraine is certainly within the differential diagnosis.  However, patient has no prior history of vertigo or motor weakness in association with migraine headaches.  At this time, with patient having new neurologic weakness and headache, patient may require MRI.  Patient did have [place] 2 years ago.  Will consult neurology for further input.  Will initiate treatment with symptomatic goal of headache control with migraine therapy.  Treatment for migraine, patient continued to be vertiginous and no change in neurologic strength testing.  Will transfer for MRI with and without contrast and MRV as per discussion with Dr. Leonel Ramsay.  Final Clinical Impressions(s) / ED Diagnoses   Final diagnoses:  Bad headache  Vertigo  Weakness of right lower  extremity  Weakness of right upper extremity    ED Discharge Orders    None       Charlesetta Shanks, MD 07/12/18 1645    Charlesetta Shanks, MD 07/13/18 0128

## 2018-07-12 NOTE — ED Notes (Signed)
Pt complains of right sided weakness, dizziness, and fatigue. Pt reports she is 5 months postpartum.

## 2018-07-12 NOTE — ED Provider Notes (Signed)
Transfer from [facility],  C/o HA, dizziness H/o migraines but w/o dizziness Frontal, blurred vision - similar to previous migraines Dizziness intermittent x 1 week, along with HA Pfeiffer to [place]: give migraine cocktail If no better, send for MRI w/wo and MRV Results: essentially negative Per re-consult to neuro: recommends additional fluids, phenergan - if symptoms resolve, she can be discharged home with neuro consult If symptoms persist, consider admission with persistent dizziness  Needs repeat exam  Repeat exam - recheck  The patient reports HA is completely resolved. Dizziness continues and is described as "I feel like I've had too much to drink". Worse when she closes her eyes, changes positions. No nausea.   She is alert, oriented.  Normal coordination Equal strength that is symmetric in UE's and LE's Positive lateral nystagmus, left  On further questioning she reports she felt like she had an ear infection at the start of symptoms with increased pressure in her left ear.   She is breastfeeding. Pharmacy consulted regarding Meclizine, who advised that it is safe to use. May cause infant somnolence but no ill effects.   Recheck after meclizine. She feels no better. She stood for exam and is romberg positive. NO drift. Discussed with Dr. Roxanne Mins who advises giving Phenergan. He will evaluate.   Per Dr. Roxanne Mins, the patient can be discharged home on Ativan. She is aware she should avoid breast feeding for 24 hours after Ativan and agrees.     Charlann Lange, PA-C 34/03/70 [id number]    Delora Fuel, MD 83/81/84 [phone]

## 2018-07-12 NOTE — ED Notes (Signed)
Patient given drink and peanut butter, crackers

## 2018-07-12 NOTE — ED Triage Notes (Addendum)
Dizziness x 1 week, feels like the room is spinning. Reports sinus issues 2 weeks ago. Also reports a headache x 1 week.

## 2018-07-12 NOTE — ED Notes (Signed)
supplied patient with a breast pump, pt has a child at 6 months old and is breast feeding.

## 2018-07-13 MED ORDER — LORAZEPAM 1 MG PO TABS: 1.0000 mg | ORAL_TABLET | Freq: Three times a day (TID) | ORAL | 0 refills | Status: DC | PRN

## 2018-07-13 NOTE — Discharge Instructions (Signed)
Return here with any worsening symptoms, otherwise, follow up with neurology in one week for recheck. Take Ativan up to 3 times daily as needed for symptoms of dizziness, being careful to avoid breastfeeding for 24 hours after taking Ativan as discussed.

## 2018-07-13 NOTE — ED Provider Notes (Signed)
Pt is a 27 y/o F with a h/o migraines who presented to [facility] with 1 week h/o headache and dizziness. On exam she was found to have RUE and RLE weakness. Neurology was consulted who recommended migraine cocktail and if no better than transfer to [facility] for further imaging studies. Pt received medications for migraine and sxs did not resolve. Still having weakness and dizziness and she was then transferred to [facility].  MRI and MRV without acute intracranial abnormality and negative for venous sinus thrombosis. 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  Imaging results discussed with Dr. Cheral Marker who recommended IVF and phenergen. If sxs resolve then pt is safe for d/c home with neuro f/u. Can consult neuro PRN if needed prior to d/c.   Pt care signed out to Charlann Lange, PA-C with plan for phenergan, IVF and repeat neuro assessment. If sxs resolve, pt safe for d/c home.    Rodney Booze, PA-C [id number]    Delora Fuel, MD 51/02/58 [phone]

## 2018-12-16 ENCOUNTER — Other Ambulatory Visit: Payer: Self-pay

## 2018-12-16 ENCOUNTER — Inpatient Hospital Stay (HOSPITAL_COMMUNITY)
Admission: AD | Admit: 2018-12-16 | Discharge: 2018-12-16 | Disposition: A | Discharge status: Home / Self Care | Payer: 59 | Attending: Obstetrics and Gynecology | Admitting: Obstetrics and Gynecology

## 2018-12-16 ENCOUNTER — Inpatient Hospital Stay (HOSPITAL_COMMUNITY): Payer: 59

## 2018-12-16 ENCOUNTER — Encounter (HOSPITAL_COMMUNITY): Payer: Self-pay | Admitting: *Deleted

## 2018-12-16 DIAGNOSIS — O26891 Other specified pregnancy related conditions, first trimester: Secondary | ICD-10-CM | POA: Insufficient documentation

## 2018-12-16 DIAGNOSIS — R109 Unspecified abdominal pain: Secondary | ICD-10-CM

## 2018-12-16 DIAGNOSIS — O208 Other hemorrhage in early pregnancy: Secondary | ICD-10-CM | POA: Insufficient documentation

## 2018-12-16 DIAGNOSIS — N939 Abnormal uterine and vaginal bleeding, unspecified: Secondary | ICD-10-CM | POA: Diagnosis not present

## 2018-12-16 DIAGNOSIS — R1030 Lower abdominal pain, unspecified: Secondary | ICD-10-CM | POA: Insufficient documentation

## 2018-12-16 LAB — COMPREHENSIVE METABOLIC PANEL
ALT: 9 U/L (ref 0–44)
AST: 14 U/L — ABNORMAL LOW (ref 15–41)
Albumin: 3.7 g/dL (ref 3.5–5.0)
Alkaline Phosphatase: 59 U/L (ref 38–126)
Anion gap: 6 (ref 5–15)
BUN: 9 mg/dL (ref 6–20)
CO2: 25 mmol/L (ref 22–32)
Calcium: 8.6 mg/dL — ABNORMAL LOW (ref 8.9–10.3)
Chloride: 104 mmol/L (ref 98–111)
Creatinine, Ser: 0.55 mg/dL (ref 0.44–1.00)
GFR calc Af Amer: >60 mL/min (ref >60)
GFR calc non Af Amer: >60 mL/min (ref >60)
Glucose, Bld: 88 mg/dL (ref 70–99)
Potassium: 3.2 mmol/L — ABNORMAL LOW (ref 3.5–5.1)
Sodium: 135 mmol/L (ref 135–145)
Total Bilirubin: 0.7 mg/dL (ref 0.3–1.2)
Total Protein: 6.5 g/dL (ref 6.5–8.1)

## 2018-12-16 LAB — CBC WITH DIFFERENTIAL/PLATELET
Abs Immature Granulocytes: 0.01 10*3/uL (ref 0.00–0.07)
Basophils Absolute: 0 10*3/uL (ref 0.0–0.1)
Basophils Relative: 0 %
Eosinophils Absolute: 0 10*3/uL (ref 0.0–0.5)
Eosinophils Relative: 0 %
HCT: 36.9 % (ref 36.0–46.0)
Hemoglobin: 12 g/dL (ref 12.0–15.0)
Immature Granulocytes: 0 %
Lymphocytes Relative: 31 %
Lymphs Abs: 1.5 10*3/uL (ref 0.7–4.0)
MCH: 29.3 pg (ref 26.0–34.0)
MCHC: 32.5 g/dL (ref 30.0–36.0)
MCV: 90 fL (ref 80.0–100.0)
Monocytes Absolute: 0.3 10*3/uL (ref 0.1–1.0)
Monocytes Relative: 5 %
Neutro Abs: 3 10*3/uL (ref 1.7–7.7)
Neutrophils Relative %: 64 %
Platelets: 231 10*3/uL (ref 150–400)
RBC: 4.1 MIL/uL (ref 3.87–5.11)
RDW: 12.8 % (ref 11.5–15.5)
WBC: 4.8 10*3/uL (ref 4.0–10.5)
nRBC: 0 % (ref 0.0–0.2)

## 2018-12-16 LAB — URINALYSIS, ROUTINE W REFLEX MICROSCOPIC
Bilirubin Urine: NEGATIVE
Glucose, UA: NEGATIVE mg/dL
Hgb urine dipstick: NEGATIVE
Ketones, ur: NEGATIVE mg/dL
Leukocytes,Ua: NEGATIVE
Nitrite: NEGATIVE
Protein, ur: NEGATIVE mg/dL
Specific Gravity, Urine: 1.025 (ref 1.005–1.030)
pH: 7 (ref 5.0–8.0)

## 2018-12-16 LAB — HCG, QUANTITATIVE, PREGNANCY: hCG, Beta Chain, Quant, S: 1313 m[IU]/mL — ABNORMAL HIGH (ref <5)

## 2018-12-16 LAB — POCT PREGNANCY, URINE: Preg Test, Ur: POSITIVE — AB

## 2018-12-16 NOTE — MAU Note (Signed)
Pt presents to MAU with complaints of lower abdominal pain that radiates from side to side. Pt had an ectopic pregnancy in 2018 and had to have left fallopian tube removed.Small amount of vaginal bleeding on and off

## 2018-12-16 NOTE — MAU Provider Note (Signed)
History     Chief Complaint  Patient presents with  . Possible Pregnancy  . Abdominal Pain   28 yo [id number]  BF (+) SPT hx left salpingectomy due to ectopic pregnancy presents with c/o LLQ pain and shoulder pain since last Friday. Pt had (+) SPT in office with [facility] at that time. She denies vaginal bleeding . Pt has not been using contraception and is unsure if desires to continue with pregnancy  OB History    Gravida  4   Para  1   Term  1   Preterm      AB  2   Living  1     SAB  1   TAB  0   Ectopic  1   Multiple      Live Births  1           Past Medical History:  Diagnosis Date  . Ectopic pregnancy   . H/O cold sores   . Headache    migraines     Past Surgical History:  Procedure Laterality Date  . DILATION AND CURETTAGE OF UTERUS    . DILATION AND EVACUATION N/A 10/26/2016   Procedure: DILATATION AND EVACUATION;  Surgeon: Crawford Givens, MD;  Location: [facility];  Service: Gynecology;  Laterality: N/A;  . FOOT SURGERY Left 2014 and 2015  . LAPAROSCOPY Left 10/26/2016   Procedure: LAPAROSCOPY OPERATIVE;  Surgeon: Crawford Givens, MD;  Location: [facility];  Service: Gynecology;  Laterality: Left;  removal of ectopic pregnancy   . TONSILLECTOMY      Family History  Problem Relation Age of Onset  . Mitral valve prolapse Mother   . Diabetes Mother   . Depression Mother   . Hypertension Mother   . Healthy Father   . Stroke Maternal Grandmother   . Cancer Maternal Grandfather   . Migraines Paternal Grandmother     Social History   Tobacco Use  . Smoking status: Never Smoker  . Smokeless tobacco: Never Used  Substance Use Topics  . Alcohol use: Yes    Comment: Socially  . Drug use: No    Allergies:  Allergies  Allergen Reactions  . Amoxicillin Nausea And Vomiting    Has patient had a PCN reaction causing immediate rash, facial/tongue/throat swelling, SOB or lightheadedness with hypotension: no Has patient had a PCN reaction causing severe  rash involving mucus membranes or skin necrosis: no Has patient had a PCN reaction that required hospitalization no Has patient had a PCN reaction occurring within the last 10 years: no If all of the above answers are "NO", then may proceed with Cephalosporin use.   . Imitrex [Sumatriptan]     C/o felt generalized flushing, burning, rhinorrhea within 2-3 min of medication  . Latex Itching    Medications Prior to Admission  Medication Sig Dispense Refill Last Dose  . acetaminophen (TYLENOL) 325 MG tablet Take 2 tablets (650 mg total) by mouth every 4 (four) hours as needed (for pain scale < 4). (Patient not taking: Reported on 07/12/2018)   Not Taking at Unknown time  . benzocaine-Menthol (DERMOPLAST) 20-0.5 % AERO Apply 1 application topically as needed for irritation (perineal discomfort). (Patient not taking: Reported on 07/12/2018)   Not Taking at Unknown time  . coconut oil OIL Apply 1 application topically as needed. (Patient not taking: Reported on 07/12/2018)  0 Not Taking at Unknown time  . dibucaine (NUPERCAINAL) 1 % OINT Place 1 application rectally as needed for hemorrhoids. (Patient  not taking: Reported on 07/12/2018) 1 Tube 0 Completed Course at Unknown time  . docusate sodium (COLACE) 50 MG capsule Take 1 capsule (50 mg total) by mouth 2 (two) times daily. (Patient not taking: Reported on 01/24/2018) 30 capsule 1 Not Taking at Unknown time  . ibuprofen (ADVIL,MOTRIN) 600 MG tablet Take 1 tablet (600 mg total) by mouth every 6 (six) hours as needed for moderate pain. (Patient not taking: Reported on 07/12/2018) 30 tablet 0 Completed Course at Unknown time  . iron polysaccharides (NIFEREX) 150 MG capsule Take 1 capsule (150 mg total) by mouth daily. (Patient not taking: Reported on 07/12/2018)   Not Taking at Unknown time  . LORazepam (ATIVAN) 1 MG tablet Take 1 tablet (1 mg total) by mouth 3 (three) times daily as needed (dizziness). 15 tablet 0   . magnesium oxide (MAG-OX) 400 (241.3 Mg)  MG tablet Take 1 tablet (400 mg total) by mouth daily. (Patient not taking: Reported on 07/12/2018)   Not Taking at Unknown time  . polyethylene glycol powder (GLYCOLAX/MIRALAX) powder Take 17 g by mouth 2 (two) times daily. (Patient not taking: Reported on 01/24/2018) 527 g 3 Completed Course at Unknown time     Physical Exam   Blood pressure 123/81, pulse 77, temperature 98.5 F (36.9 C), resp. rate 16, height 5\' 5"  (1.651 m), weight 61.2 kg, last menstrual period 11/13/2018, SpO2 100 %, unknown if currently breastfeeding.  General appearance: alert, cooperative and no distress Lungs: clear to auscultation bilaterally Heart: regular rate and rhythm, S1, S2 normal, no murmur, click, rub or gallop Abdomen: soft nondistended suprapubic tender Pelvic: cervix normal in appearance, external genitalia normal, no adnexal masses or tenderness, uterus normal size, shape, and consistency and bladder tender Extremities: no edema, redness or tenderness in the calves or thighs ED Course  IMP: abdominal pain Hx ectopic pregnancy P) [facility]quant. Pelvic sonogram. Additional labs in the event need MTX. U/a, ucx MDM  addendum US Ob Less Than 14 Weeks With Ob Transvaginal  Result Date: 12/16/2018 CLINICAL DATA:  Abdominal pain EXAM: OBSTETRIC <14 WK [place] AND TRANSVAGINAL OB US TECHNIQUE: Both transabdominal and transvaginal ultrasound examinations were performed for complete evaluation of the gestation as well as the maternal uterus, adnexal regions, and pelvic cul-de-sac. Transvaginal technique was performed to assess early pregnancy. COMPARISON:  None. FINDINGS: Intrauterine gestational sac: Possible early single gestational sac Yolk sac:  Not visualized Embryo:  Not visualized Cardiac Activity: Not visualized Heart Rate:   bpm MSD: 2.4 mm   4 w   6 d CRL:    mm    w    d                  [place] EDC: Subchorionic hemorrhage:  Moderate subchorionic hemorrhage. Maternal uterus/adnexae: No adnexal mass. Small amount of free  fluid in the right adnexa. Multiple ovarian follicles. IMPRESSION: Possible early intrauterine gestational sac, but no yolk sac, fetal pole, or cardiac activity yet visualized. Recommend follow-up quantitative B-HCG levels and follow-up US in 14 days to assess viability. This recommendation follows SRU consensus guidelines: Diagnostic Criteria for Nonviable Pregnancy Early in the First Trimester. [place] Med 2013; 295:1884-16. Moderate subchorionic hemorrhage. Electronically Signed   By: Rolm Baptise M.D.   On: 12/16/2018 12:31   CBC    Component Value Date/Time   WBC 4.8 12/16/2018 1129   RBC 4.10 12/16/2018 1129   HGB 12.0 12/16/2018 1129   HCT 36.9 12/16/2018 1129   PLT 231 12/16/2018  1129   MCV 90.0 12/16/2018 1129   MCH 29.3 12/16/2018 1129   MCHC 32.5 12/16/2018 1129   RDW 12.8 12/16/2018 1129   LYMPHSABS 1.5 12/16/2018 1129   MONOABS 0.3 12/16/2018 1129   EOSABS 0.0 12/16/2018 1129   BASOSABS 0.0 12/16/2018 1129    Disc early preg. Pt expresses ambivalence regarding the pregnancy. Discussed this issue at length and need to f/u [facility] sched sonogram in office in 2 wk D/c home. Marvene Staff, MD 1:30 PM 12/16/2018

## 2019-07-13 ENCOUNTER — Other Ambulatory Visit: Payer: Self-pay

## 2019-07-13 ENCOUNTER — Other Ambulatory Visit: Payer: Self-pay | Admitting: Family Medicine

## 2019-07-13 DIAGNOSIS — R2681 Unsteadiness on feet: Secondary | ICD-10-CM

## 2019-07-13 DIAGNOSIS — R42 Dizziness and giddiness: Secondary | ICD-10-CM

## 2019-07-13 DIAGNOSIS — R519 Headache, unspecified: Secondary | ICD-10-CM

## 2019-07-13 DIAGNOSIS — Z20822 Contact with and (suspected) exposure to covid-19: Secondary | ICD-10-CM

## 2019-07-13 DIAGNOSIS — H5319 Other subjective visual disturbances: Secondary | ICD-10-CM

## 2019-07-15 LAB — NOVEL CORONAVIRUS, NAA: SARS-CoV-2, NAA: NOT DETECTED

## 2019-07-23 ENCOUNTER — Ambulatory Visit
Admission: RE | Admit: 2019-07-23 | Discharge: 2019-07-23 | Disposition: A | Discharge status: Home / Self Care | Payer: 59 | Source: Ambulatory Visit | Attending: Family Medicine | Admitting: Family Medicine

## 2019-07-23 ENCOUNTER — Other Ambulatory Visit: Payer: Self-pay

## 2019-07-23 DIAGNOSIS — R2681 Unsteadiness on feet: Secondary | ICD-10-CM

## 2019-07-23 DIAGNOSIS — R519 Headache, unspecified: Secondary | ICD-10-CM

## 2019-07-23 DIAGNOSIS — H5319 Other subjective visual disturbances: Secondary | ICD-10-CM

## 2019-07-23 DIAGNOSIS — R42 Dizziness and giddiness: Secondary | ICD-10-CM

## 2019-08-18 ENCOUNTER — Encounter (HOSPITAL_BASED_OUTPATIENT_CLINIC_OR_DEPARTMENT_OTHER): Payer: Self-pay | Admitting: *Deleted

## 2019-08-18 ENCOUNTER — Other Ambulatory Visit: Payer: Self-pay

## 2019-08-18 ENCOUNTER — Emergency Department (HOSPITAL_BASED_OUTPATIENT_CLINIC_OR_DEPARTMENT_OTHER): Payer: 59

## 2019-08-18 ENCOUNTER — Emergency Department (HOSPITAL_BASED_OUTPATIENT_CLINIC_OR_DEPARTMENT_OTHER)
Admission: EM | Admit: 2019-08-18 | Discharge: 2019-08-18 | Disposition: A | Discharge status: Home / Self Care | Payer: 59 | Attending: Emergency Medicine | Admitting: Emergency Medicine

## 2019-08-18 DIAGNOSIS — R519 Headache, unspecified: Secondary | ICD-10-CM | POA: Insufficient documentation

## 2019-08-18 DIAGNOSIS — R0602 Shortness of breath: Secondary | ICD-10-CM | POA: Diagnosis not present

## 2019-08-18 DIAGNOSIS — F410 Panic disorder [episodic paroxysmal anxiety] without agoraphobia: Secondary | ICD-10-CM | POA: Diagnosis present

## 2019-08-18 DIAGNOSIS — Z79899 Other long term (current) drug therapy: Secondary | ICD-10-CM | POA: Insufficient documentation

## 2019-08-18 LAB — BASIC METABOLIC PANEL
Anion gap: 7 (ref 5–15)
BUN: 13 mg/dL (ref 6–20)
CO2: 23 mmol/L (ref 22–32)
Calcium: 9 mg/dL (ref 8.9–10.3)
Chloride: 105 mmol/L (ref 98–111)
Creatinine, Ser: 0.61 mg/dL (ref 0.44–1.00)
GFR calc Af Amer: >60 mL/min (ref >60)
GFR calc non Af Amer: >60 mL/min (ref >60)
Glucose, Bld: 117 mg/dL — ABNORMAL HIGH (ref 70–99)
Potassium: 3.4 mmol/L — ABNORMAL LOW (ref 3.5–5.1)
Sodium: 135 mmol/L (ref 135–145)

## 2019-08-18 LAB — CBC WITH DIFFERENTIAL/PLATELET
Abs Immature Granulocytes: 0.01 10*3/uL (ref 0.00–0.07)
Basophils Absolute: 0 10*3/uL (ref 0.0–0.1)
Basophils Relative: 0 %
Eosinophils Absolute: 0 10*3/uL (ref 0.0–0.5)
Eosinophils Relative: 0 %
HCT: 38.7 % (ref 36.0–46.0)
Hemoglobin: 12.4 g/dL (ref 12.0–15.0)
Immature Granulocytes: 0 %
Lymphocytes Relative: 37 %
Lymphs Abs: 2.7 10*3/uL (ref 0.7–4.0)
MCH: 28.5 pg (ref 26.0–34.0)
MCHC: 32 g/dL (ref 30.0–36.0)
MCV: 89 fL (ref 80.0–100.0)
Monocytes Absolute: 0.3 10*3/uL (ref 0.1–1.0)
Monocytes Relative: 4 %
Neutro Abs: 4.1 10*3/uL (ref 1.7–7.7)
Neutrophils Relative %: 59 %
Platelets: 232 10*3/uL (ref 150–400)
RBC: 4.35 MIL/uL (ref 3.87–5.11)
RDW: 15.1 % (ref 11.5–15.5)
WBC: 7.1 10*3/uL (ref 4.0–10.5)
nRBC: 0 % (ref 0.0–0.2)

## 2019-08-18 LAB — URINALYSIS, ROUTINE W REFLEX MICROSCOPIC
Bilirubin Urine: NEGATIVE
Glucose, UA: NEGATIVE mg/dL
Hgb urine dipstick: NEGATIVE
Ketones, ur: NEGATIVE mg/dL
Nitrite: NEGATIVE
Protein, ur: NEGATIVE mg/dL
Specific Gravity, Urine: >1.03 — ABNORMAL HIGH (ref 1.005–1.030)
pH: 6 (ref 5.0–8.0)

## 2019-08-18 LAB — URINALYSIS, MICROSCOPIC (REFLEX): RBC / HPF: NONE SEEN RBC/hpf (ref 0–5)

## 2019-08-18 LAB — TROPONIN I (HIGH SENSITIVITY): Troponin I (High Sensitivity): <2 ng/L (ref <18)

## 2019-08-18 LAB — D-DIMER, QUANTITATIVE: D-Dimer, Quant: 0.36 ug/mL-FEU (ref 0.00–0.50)

## 2019-08-18 LAB — PREGNANCY, URINE: Preg Test, Ur: NEGATIVE

## 2019-08-18 MED ORDER — HYDROXYZINE HCL 25 MG PO TABS: 25.0000 mg | ORAL_TABLET | Freq: Two times a day (BID) | ORAL | 0 refills | Status: AC

## 2019-08-18 NOTE — ED Triage Notes (Signed)
Pt c/o h/a and right sided chest pain x 1  Day , increased stress at home and work .

## 2019-08-18 NOTE — Discharge Instructions (Addendum)
Your laboratory results are within normal limits today.  Your chest x-ray did not show any pneumonia.  Please follow-up with your primary care physician in order to obtain further evaluation for your symptoms.

## 2019-08-18 NOTE — ED Provider Notes (Signed)
[facility] EMERGENCY DEPARTMENT Provider Note   CSN: [id number] Arrival date & time: 08/18/19  2114     History   Chief Complaint Chief Complaint  Patient presents with  . Headache    HPI Baker Rebecca Benjamin is a 28 y.o. female.     28 y.o female with a PMH of cluster headaches, ectopic pregnancy presents to the ED with a chief complaint of panic attack/headache since today.  Patient reports being at work, she suddenly felt a pain to her right chest, reports pain has been intermittent, is worse around the right armpit.  She reports the pain is exacerbated with deep inspiration.  Patient also reports a headache, this feels like her prior history of migraines.  She reports she has been under a lot of stress at work recently, she has had multiple conflicts with family along with work.  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  She reports she was previously placed on OCPs by her OB/GYN however these were discontinued due to "some abnormality in her CT scan brain", she is unclear on this, last took these in October. She denies any sick exposures. She does have a family history of panic attacks, but has not been placed in medication for this. She denies any fever, cough, prior history of blood clots or CAD.   The history is provided by the patient.  Headache Associated symptoms: no abdominal pain, no back pain, no cough, no ear pain, no eye pain, no fever, no seizures, no sore throat and no vomiting     Past Medical History:  Diagnosis Date  . Ectopic pregnancy   . H/O cold sores   . Headache    migraines     Patient Active Problem List   Diagnosis Date Noted  . 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 Obstetrical laceration -  2nd degreePerineal; left Labial minora 01/25/2018  . Maternal anemia, with delivery 01/25/2018  . Postpartum care following vaginal delivery 01/24/2018  . Ectopic pregnancy 10/2016 01/02/2017  . Cluster headache, not intractable 01/11/2016  . 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 DILATION AND CURETTAGE OF UTERUS    . DILATION AND EVACUATION N/A 10/26/2016   Procedure: DILATATION AND EVACUATION;  Surgeon: Crawford Givens, MD;  Location: [facility];  Service: Gynecology;  Laterality: N/A;  . FOOT SURGERY Left 2014 and 2015  . LAPAROSCOPY Left 10/26/2016   Procedure: LAPAROSCOPY OPERATIVE;  Surgeon: Crawford Givens, MD;  Location: [facility];  Service: Gynecology;  Laterality: Left;  removal of ectopic pregnancy   . TONSILLECTOMY       OB History    Gravida  4   Para  1   Term  1   Preterm      AB  2   Living  1     SAB  1   TAB  0   Ectopic  1   Multiple      Live Births  1            Home Medications    Prior to Admission medications   Medication Sig Start Date End Date Taking? Authorizing Provider  docusate sodium (COLACE) 50 MG capsule Take 1 capsule (50 mg total) by mouth 2 (two) times daily. Patient not taking: Reported on 01/24/2018 08/05/17   Prothero, Pleas Koch, CNM  hydrOXYzine (ATARAX/VISTARIL) 25 MG tablet Take 1 tablet (25 mg total) by mouth  2 (two) times daily for 7 days. 08/18/19 08/25/19  Janeece Fitting, PA-C  iron polysaccharides (NIFEREX) 150 MG capsule Take 1 capsule (150 mg total) by mouth daily. Patient not taking: Reported on 07/12/2018 01/27/18   Juliene Pina, CNM  magnesium oxide (MAG-OX) 400 (241.3 Mg) MG tablet Take 1 tablet (400 mg total) by mouth daily. Patient not taking: Reported on 07/12/2018 01/27/18   Juliene Pina, CNM  polyethylene glycol powder (GLYCOLAX/MIRALAX) powder Take 17 g by mouth 2 (two) times daily. Patient not taking: Reported on 01/24/2018 06/17/17   Katheren Shams, DO    Family History Family History  Problem Relation Age of Onset  . Mitral valve prolapse Mother   . Diabetes Mother   . Depression Mother   . Hypertension Mother   . Healthy Father   . Stroke Maternal Grandmother   . Cancer  Maternal Grandfather   . Migraines Paternal Grandmother     Social History Social History   Tobacco Use  . Smoking status: Never Smoker  . Smokeless tobacco: Never Used  Substance Use Topics  . Alcohol use: Yes    Comment: Socially  . Drug use: No     Allergies   Amoxicillin, Imitrex [sumatriptan], and Latex   Review of Systems Review of Systems  Constitutional: Negative for chills and fever.  HENT: Negative for ear pain and sore throat.   Eyes: Negative for pain and visual disturbance.  Respiratory: Positive for shortness of breath. Negative for cough.   Cardiovascular: Positive for chest pain. Negative for palpitations.  Gastrointestinal: Negative for abdominal pain and vomiting.  Genitourinary: Negative for dysuria and hematuria.  Musculoskeletal: Negative for arthralgias and back pain.  Skin: Negative for color change and rash.  Neurological: Positive for headaches. Negative for seizures and syncope.  All other systems reviewed and are negative.    Physical Exam Updated Vital Signs BP (!) 147/95   Pulse 77   Temp 99.4 F (37.4 C)   Resp 16   Ht 5\' 6"  (1.676 m)   Wt 63.5 kg   LMP 07/30/2019   SpO2 100%   Breastfeeding Unknown   BMI 22.60 kg/m   Physical Exam Vitals signs and nursing note reviewed.  Constitutional:      Appearance: She is well-developed. She is not ill-appearing.  HENT:     Head: Normocephalic and atraumatic.     Mouth/Throat:     Mouth: Mucous membranes are moist.     Pharynx: Oropharynx is clear.  Eyes:     Pupils: Pupils are equal, round, and reactive to light.  Neck:     Musculoskeletal: Normal range of motion and neck supple.  Cardiovascular:     Rate and Rhythm: Normal rate.     Comments: No bilateral pitting edema. Pulmonary:     Effort: Pulmonary effort is normal.     Breath sounds: Normal breath sounds. No wheezing or rhonchi.  Abdominal:     General: Bowel sounds are normal. There is no distension.     Palpations:  Abdomen is soft.  Skin:    General: Skin is warm and dry.  Neurological:     Mental Status: She is alert and oriented to person, place, and time.     GCS: GCS eye subscore is 4. GCS verbal subscore is 5. GCS motor subscore is 6.     Comments: No facial asymmetry, dysarthria, moves all upper and lower extremities without weakness.  Psychiatric:        Mood  and Affect: Mood is anxious.      ED Treatments / Results  Labs (all labs ordered are listed, but only abnormal results are displayed) Labs Reviewed  BASIC METABOLIC PANEL - Abnormal; Notable for the following components:      Result Value   Potassium 3.4 (*)    Glucose, Bld 117 (*)    All other components within normal limits  URINALYSIS, ROUTINE W REFLEX MICROSCOPIC - Abnormal; Notable for the following components:   APPearance CLOUDY (*)    Specific Gravity, Urine >1.030 (*)    Leukocytes,Ua TRACE (*)    All other components within normal limits  URINALYSIS, MICROSCOPIC (REFLEX) - Abnormal; Notable for the following components:   Bacteria, UA RARE (*)    All other components within normal limits  CBC WITH DIFFERENTIAL/PLATELET  D-DIMER, QUANTITATIVE (NOT AT [facility])  PREGNANCY, URINE  TROPONIN I (HIGH SENSITIVITY)    EKG EKG Interpretation  Date/Time:  Tuesday August 18 2019 21:57:58 EST Ventricular Rate:  84 PR Interval:    QRS Duration: 78 QT Interval:  380 QTC Calculation: 450 R Axis:   62 Text Interpretation: Sinus rhythm since last tracing no significant change Confirmed by Malvin Johns [phone]) on 08/18/2019 10:23:53 PM   Radiology Dg Chest Portable 1 View  Result Date: 08/18/2019 CLINICAL DATA:  Chest pain EXAM: PORTABLE CHEST 1 VIEW COMPARISON:  08/17/2013 FINDINGS: The heart size and mediastinal contours are within normal limits. Both lungs are clear. The visualized skeletal structures are unremarkable. IMPRESSION: No active disease. Electronically Signed   By: Ulyses Jarred M.D.   On: 08/18/2019 22:22     Procedures Procedures (including critical care time)  Medications Ordered in ED Medications - No data to display   Initial Impression / Assessment and Plan / ED Course  I have reviewed the triage vital signs and the nursing notes.  Pertinent labs & imaging results that were available during my care of the patient were reviewed by me and considered in my medical decision making (see chart for details).   Patient with no pertinent past medical history presents to the ED with complaints of chest pain, shortness of breath which began sudden onset at work.  She reports she has been under a lot of stress, did have Vistaril last night to help with her sleep.  She does not have a previous diagnosis of anxiety however feels that she likely had a panic attack at work.  Pain is centralized around the right side of her chest does not radiate onto her back.  She arrived in the ED hemodynamically stable, satting at 100% on room air, afebrile.  CBC without any signs of infection, no signs of anemia.  BMP with some mild hypokalemia, creatinine level is within normal limits.  He denies any urinary symptoms, abdominal pain.  X-ray of her chest did not show any consolidation, pneumothorax, pleural effusion.  Pulmonary embolism was considered as patient was recently on OCPs, discontinued using October due to unknown reasons.  D-dimer level was negative, suspect PE less likely.  She is currently sexually active, denies any vaginal bleeding, vaginal discharge, urine pregnancy is currently pending.  She does not have any urinary symptoms we will check a UA.   Patient shows concern that her symptoms likely occurred after her having a panic attack at work.  She has not had one in the past, does not have any prior history of anxiety, she was instructed to follow-up with her PCP for these complaints along with further  evaluation and treatment of her anxiety.  Pregnancy test is negative, troponin is also negative.   Patient does report she has run out of her Atarax medication, will provide her with a prescription until her PCP appointment next week.  Patient is and agrees with management, return precautions provided at length.    Portions of this note were generated with [employer]. Dictation errors may occur despite best attempts at proofreading.  Final Clinical Impressions(s) / ED Diagnoses   Final diagnoses:  Shortness of breath    ED Discharge Orders         Ordered    hydrOXYzine (ATARAX/VISTARIL) 25 MG tablet  2 times daily     08/18/19 2310           Janeece Fitting, PA-C 08/18/19 2310    Malvin Johns, MD 08/18/19 2324

## 2019-09-06 ENCOUNTER — Emergency Department (HOSPITAL_BASED_OUTPATIENT_CLINIC_OR_DEPARTMENT_OTHER)
Admission: EM | Admit: 2019-09-06 | Discharge: 2019-09-06 | Disposition: A | Discharge status: Home / Self Care | Payer: 59 | Attending: Emergency Medicine | Admitting: Emergency Medicine

## 2019-09-06 ENCOUNTER — Encounter (HOSPITAL_BASED_OUTPATIENT_CLINIC_OR_DEPARTMENT_OTHER): Payer: Self-pay | Admitting: *Deleted

## 2019-09-06 ENCOUNTER — Other Ambulatory Visit: Payer: Self-pay

## 2019-09-06 DIAGNOSIS — N907 Vulvar cyst: Secondary | ICD-10-CM | POA: Insufficient documentation

## 2019-09-06 DIAGNOSIS — N7689 Other specified inflammation of vagina and vulva: Secondary | ICD-10-CM | POA: Diagnosis present

## 2019-09-06 DIAGNOSIS — Z91040 Latex allergy status: Secondary | ICD-10-CM | POA: Insufficient documentation

## 2019-09-06 LAB — PREGNANCY, URINE: Preg Test, Ur: NEGATIVE

## 2019-09-06 LAB — WET PREP, GENITAL
Clue Cells Wet Prep HPF POC: NONE SEEN
Sperm: NONE SEEN
Trich, Wet Prep: NONE SEEN
Yeast Wet Prep HPF POC: NONE SEEN

## 2019-09-06 LAB — URINALYSIS, ROUTINE W REFLEX MICROSCOPIC
Bilirubin Urine: NEGATIVE
Glucose, UA: NEGATIVE mg/dL
Hgb urine dipstick: NEGATIVE
Ketones, ur: NEGATIVE mg/dL
Leukocytes,Ua: NEGATIVE
Nitrite: NEGATIVE
Protein, ur: NEGATIVE mg/dL
Specific Gravity, Urine: 1.025 (ref 1.005–1.030)
pH: 7 (ref 5.0–8.0)

## 2019-09-06 NOTE — ED Triage Notes (Signed)
Pt reports she noticed a painful bump inside her vagina 2 days ago

## 2019-09-06 NOTE — ED Provider Notes (Signed)
[facility] EMERGENCY DEPARTMENT Provider Note   CSN: [id number] Arrival date & time: 09/06/19  1615     History Chief Complaint  Patient presents with  . Vaginal Pain    Rebecca Benjamin is a 28 y.o. female who presents with pain in the labia minora. Patient states that 3 days ago she felt a sharp pain in her R vulvar region. Today she looked down and notice a tender bump on the right labia. She is monogamous with her husband. She is very worried and thinks that it could be "herpes." She denies any other vaginal sxs, urinary sxs, pelvic pain, fever or chills.  HPI     Past Medical History:  Diagnosis Date  . Ectopic pregnancy   . H/O cold sores   . Headache    migraines     Patient Active Problem List   Diagnosis Date Noted  . 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 Obstetrical laceration -  2nd degreePerineal; left Labial minora 01/25/2018  . Maternal anemia, with delivery 01/25/2018  . Postpartum care following vaginal delivery 01/24/2018  . Ectopic pregnancy 10/2016 01/02/2017  . Cluster headache, not intractable 01/11/2016  . 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 DILATION AND CURETTAGE OF UTERUS    . DILATION AND EVACUATION N/A 10/26/2016   Procedure: DILATATION AND EVACUATION;  Surgeon: Crawford Givens, MD;  Location: [facility];  Service: Gynecology;  Laterality: N/A;  . FOOT SURGERY Left 2014 and 2015  . LAPAROSCOPY Left 10/26/2016   Procedure: LAPAROSCOPY OPERATIVE;  Surgeon: Crawford Givens, MD;  Location: [facility];  Service: Gynecology;  Laterality: Left;  removal of ectopic pregnancy   . TONSILLECTOMY       OB History    Gravida  4   Para  1   Term  1   Preterm      AB  2   Living  1     SAB  1   TAB  0   Ectopic  1   Multiple      Live Births  1           Family History  Problem Relation Age of Onset  . Mitral valve prolapse Mother   . Diabetes Mother   . Depression  Mother   . Hypertension Mother   . Healthy Father   . Stroke Maternal Grandmother   . Cancer Maternal Grandfather   . Migraines Paternal Grandmother     Social History   Tobacco Use  . Smoking status: Never Smoker  . Smokeless tobacco: Never Used  Substance Use Topics  . Alcohol use: Yes    Comment: Socially  . Drug use: No    Home Medications Prior to Admission medications   Medication Sig Start Date End Date Taking? Authorizing Provider  docusate sodium (COLACE) 50 MG capsule Take 1 capsule (50 mg total) by mouth 2 (two) times daily. Patient not taking: Reported on 01/24/2018 08/05/17   Prothero, Pleas Koch, CNM  iron polysaccharides (NIFEREX) 150 MG capsule Take 1 capsule (150 mg total) by mouth daily. Patient not taking: Reported on 07/12/2018 01/27/18   Juliene Pina, CNM  magnesium oxide (MAG-OX) 400 (241.3 Mg) MG tablet Take 1 tablet (400 mg total) by mouth daily. Patient not taking: Reported on 07/12/2018 01/27/18   Juliene Pina, CNM  polyethylene glycol powder (GLYCOLAX/MIRALAX) powder Take 17 g by mouth 2 (two) times daily. Patient not  taking: Reported on 01/24/2018 06/17/17   Katheren Shams, DO    Allergies    Amoxicillin, Imitrex [sumatriptan], and Latex  Review of Systems   Review of Systems Ten systems reviewed and are negative for acute change, except as noted in the HPI.   Physical Exam Updated Vital Signs BP (!) 154/96 (BP Location: Left Arm)   Pulse 87   Temp 98.7 F (37.1 C) (Oral)   Resp 18   Ht 5\' 6"  (1.676 m)   Wt 63.5 kg   LMP 08/18/2019   SpO2 100%   Breastfeeding No   BMI 22.60 kg/m   Physical Exam Vitals and nursing note reviewed. Exam conducted with a chaperone present.  Constitutional:      General: She is not in acute distress.    Appearance: She is well-developed. She is not diaphoretic.  HENT:     Head: Normocephalic and atraumatic.  Eyes:     General: No scleral icterus.    Conjunctiva/sclera: Conjunctivae normal.    Cardiovascular:     Rate and Rhythm: Normal rate and regular rhythm.     Heart sounds: Normal heart sounds. No murmur. No friction rub. No gallop.   Pulmonary:     Effort: Pulmonary effort is normal. No respiratory distress.     Breath sounds: Normal breath sounds.  Abdominal:     General: Bowel sounds are normal. There is no distension.     Palpations: Abdomen is soft. There is no mass.     Tenderness: There is no abdominal tenderness. There is no guarding.  Genitourinary:    Exam position: Lithotomy position.     Labia:        Right: Tenderness present.      Vagina: Normal.     Cervix: Normal.     Uterus: Normal.      Adnexa: Right adnexa normal.     Comments: Small, firm , tender nodule of the R labia minora Musculoskeletal:     Cervical back: Normal range of motion.  Skin:    General: Skin is warm and dry.  Neurological:     Mental Status: She is alert and oriented to person, place, and time.  Psychiatric:        Behavior: Behavior normal.     ED Results / Procedures / Treatments   Labs (all labs ordered are listed, but only abnormal results are displayed) Labs Reviewed  PREGNANCY, URINE  URINALYSIS, ROUTINE W REFLEX MICROSCOPIC    EKG None  Radiology No results found.  Procedures Procedures (including critical care time)  Medications Ordered in ED Medications - No data to display  ED Course  I have reviewed the triage vital signs and the nursing notes.  Pertinent labs & imaging results that were available during my care of the patient were reviewed by me and considered in my medical decision making (see chart for details).    MDM Rules/Calculators/A&P                      28 y/o f with c/o  Painful vaginal bump. DDX includes but is not limited to Bartholin's cyst, Labial abscess, HSV ulcers, thrombose varicosity, ingrown hair.  The benign abdominal exam although her wet prep does show white blood cell count I did have low suspicion for any type of  vaginal infection.  Pregnancy test is negative.  UA is without signs of infection.  Her exam is most consistent with an inflamed epidermoid cyst in the  labia minora.  There is no ulceration or cluster of vesicles suggestive of HSV.  Patient declines I&D.  It is extremely tiny I agree with watch and wait and supportive care at home.  Recommend ichthammol ointment, sits baths.  Close outpatient follow-up with her OB/GYN.  Patient appears review her discharge at this time Final Clinical Impression(s) / ED Diagnoses Final diagnoses:  None    Rx / DC Orders ED Discharge Orders    None       Margarita Mail, PA-C 09/06/19 1820    Margette Fast, MD 09/06/19 1930

## 2019-09-06 NOTE — Discharge Instructions (Addendum)
Use drawing slave (ichthammol ointment) on the area. Continue to keep an eye on the are as it may swell and become more painful. Your testing showed no abnormalities today.  The information in the d.c paperwork is about a different type of cyst in the vaginal area, but provides good information about home care.

## 2019-09-08 LAB — GC/CHLAMYDIA PROBE AMP (~~LOC~~) NOT AT ARMC
Chlamydia: NEGATIVE
Neisseria Gonorrhea: NEGATIVE

## 2019-09-18 ENCOUNTER — Other Ambulatory Visit: Payer: 59

## 2020-03-15 ENCOUNTER — Ambulatory Visit (HOSPITAL_COMMUNITY)
Admission: RE | Admit: 2020-03-15 | Discharge: 2020-03-15 | Disposition: A | Discharge status: Home / Self Care | Payer: 59 | Source: Ambulatory Visit | Attending: Obstetrics and Gynecology | Admitting: Obstetrics and Gynecology

## 2020-03-15 ENCOUNTER — Other Ambulatory Visit: Payer: Self-pay

## 2020-03-15 ENCOUNTER — Other Ambulatory Visit (HOSPITAL_COMMUNITY): Payer: Self-pay | Admitting: Obstetrics and Gynecology

## 2020-03-15 ENCOUNTER — Other Ambulatory Visit (HOSPITAL_BASED_OUTPATIENT_CLINIC_OR_DEPARTMENT_OTHER): Payer: Self-pay | Admitting: Obstetrics and Gynecology

## 2020-03-15 ENCOUNTER — Other Ambulatory Visit: Payer: Self-pay | Admitting: Obstetrics and Gynecology

## 2020-03-15 DIAGNOSIS — R1011 Right upper quadrant pain: Secondary | ICD-10-CM

## 2020-09-09 ENCOUNTER — Other Ambulatory Visit: Payer: Self-pay

## 2020-09-09 ENCOUNTER — Encounter (HOSPITAL_BASED_OUTPATIENT_CLINIC_OR_DEPARTMENT_OTHER): Payer: Self-pay | Admitting: *Deleted

## 2020-09-09 ENCOUNTER — Emergency Department (HOSPITAL_BASED_OUTPATIENT_CLINIC_OR_DEPARTMENT_OTHER)
Admission: EM | Admit: 2020-09-09 | Discharge: 2020-09-10 | Disposition: A | Discharge status: Home / Self Care | Payer: 59 | Attending: Emergency Medicine | Admitting: Emergency Medicine

## 2020-09-09 DIAGNOSIS — Z20822 Contact with and (suspected) exposure to covid-19: Secondary | ICD-10-CM

## 2020-09-09 DIAGNOSIS — Z91040 Latex allergy status: Secondary | ICD-10-CM | POA: Diagnosis not present

## 2020-09-09 DIAGNOSIS — U071 COVID-19: Secondary | ICD-10-CM | POA: Insufficient documentation

## 2020-09-09 DIAGNOSIS — J029 Acute pharyngitis, unspecified: Secondary | ICD-10-CM | POA: Diagnosis present

## 2020-09-09 NOTE — ED Triage Notes (Signed)
Pt reports she has had a scratchy throat today. Her husband is a Emergency planning/management officer and believes he made have been exposed to covid at work. She has been vaccinated for covid

## 2020-09-09 NOTE — ED Provider Notes (Signed)
[facility] Provider Note: Georgena Spurling, MD, FACEP  CSN: [id number] MRN: [id number] ARRIVAL: 09/09/20 at [place]: [place]  Sore Throat   HISTORY OF PRESENT ILLNESS  09/09/20 11:27 PM Rebecca Benjamin is a 29 y.o. female whose husband is a Higher education careers adviser.  He is recently been diagnosed with Covid.  The patient is here with 1 day of scratchy throat, generalized body aches, shortness of breath (a sensation that she has been overexerting herself) and pain in her chest with deep breathing or coughing.  She rates his pain is a 3 out of 10.  Her symptoms in general are currently mild.  She has not yet had a fever.  She has been vaccinated against COVID-19.   Past Medical History:  Diagnosis Date  . Ectopic pregnancy   . H/O cold sores   . Headache    migraines     Past Surgical History:  Procedure Laterality Date  . DILATION AND CURETTAGE OF UTERUS    . DILATION AND EVACUATION N/A 10/26/2016   Procedure: DILATATION AND EVACUATION;  Surgeon: Crawford Givens, MD;  Location: [facility];  Service: Gynecology;  Laterality: N/A;  . FOOT SURGERY Left 2014 and 2015  . LAPAROSCOPY Left 10/26/2016   Procedure: LAPAROSCOPY OPERATIVE;  Surgeon: Crawford Givens, MD;  Location: [facility];  Service: Gynecology;  Laterality: Left;  removal of ectopic pregnancy   . TONSILLECTOMY      Family History  Problem Relation Age of Onset  . Mitral valve prolapse Mother   . Diabetes Mother   . Depression Mother   . Hypertension Mother   . Healthy Father   . Stroke Maternal Grandmother   . Cancer Maternal Grandfather   . Migraines Paternal Grandmother     Social History   Tobacco Use  . Smoking status: Never Smoker  . Smokeless tobacco: Never Used  Vaping Use  . Vaping Use: Never used  Substance Use Topics  . Alcohol use: Yes    Comment: Socially  . Drug use: No    Prior to Admission medications   Medication Sig Start Date End Date Taking? Authorizing Provider  iron  polysaccharides (NIFEREX) 150 MG capsule Take 1 capsule (150 mg total) by mouth daily. Patient not taking: Reported on 07/12/2018 01/27/18 09/09/20  Juliene Pina, CNM    Allergies Amoxicillin, Imitrex [sumatriptan], and Latex   REVIEW OF SYSTEMS  Negative except as noted here or in the History of Present Illness.   PHYSICAL EXAMINATION  Initial Vital Signs Blood pressure (!) 149/100, pulse (!) 106, temperature 98.5 F (36.9 C), temperature source Oral, resp. rate 18, height 5\' 6"  (1.676 m), weight 61.2 kg, last menstrual period 09/02/2020, SpO2 100 %.  Examination General: Well-developed, well-nourished female in no acute distress; appearance consistent with age of record HENT: normocephalic; atraumatic; no pharyngeal erythema or exudate Eyes: pupils equal, round and reactive to light; extraocular muscles intact Neck: supple Heart: regular rate and rhythm Lungs: clear to auscultation bilaterally Abdomen: soft; nondistended; nontender; bowel sounds present Extremities: No deformity; full range of motion; pulses normal Neurologic: Awake, alert and oriented; motor function intact in all extremities and symmetric; no facial droop Skin: Warm and dry Psychiatric: Normal mood and affect   RESULTS  Summary of this visit's results, reviewed and interpreted by myself:   EKG Interpretation  Date/Time:    Ventricular Rate:    PR Interval:    QRS Duration:   QT Interval:    QTC Calculation:  R Axis:     Text Interpretation:        Laboratory Studies: No results found for this or any previous visit (from the past 24 hour(s)). Imaging Studies: No results found.  ED COURSE and MDM  Nursing notes, initial and subsequent vitals signs, including pulse oximetry, reviewed and interpreted by myself.  Vitals:   09/09/20 2232 09/09/20 2239  BP: (!) 149/100   Pulse: (!) 106   Resp: 18   Temp: 98.5 F (36.9 C)   TempSrc: Oral   SpO2: 100%   Weight:  61.2 kg  Height:  5\' 6"   (1.676 m)   Medications - No data to display  Patient's presentation is suspicious for early Covid.  We will test and provide instructions for the CDC's current quarantine guidelines.  PROCEDURES  Procedures   ED DIAGNOSES     ICD-10-CM   1. Suspected COVID-19 virus infection         T62.263, MD 09/09/20 2337

## 2020-09-09 NOTE — ED Notes (Signed)
Patient reports scratchy throat, Husband is a Emergency planning/management officer and is symptomatic and is being tested at [facility].

## 2020-09-10 ENCOUNTER — Telehealth (HOSPITAL_BASED_OUTPATIENT_CLINIC_OR_DEPARTMENT_OTHER): Payer: Self-pay | Admitting: Emergency Medicine

## 2020-09-10 LAB — SARS CORONAVIRUS 2 (TAT 6-24 HRS): SARS Coronavirus 2: POSITIVE — AB

## 2020-09-11 ENCOUNTER — Telehealth (HOSPITAL_BASED_OUTPATIENT_CLINIC_OR_DEPARTMENT_OTHER): Payer: Self-pay | Admitting: Emergency Medicine

## 2020-09-13 ENCOUNTER — Emergency Department (HOSPITAL_BASED_OUTPATIENT_CLINIC_OR_DEPARTMENT_OTHER)
Admission: EM | Admit: 2020-09-13 | Discharge: 2020-09-13 | Disposition: A | Discharge status: Home / Self Care | Payer: 59 | Attending: Emergency Medicine | Admitting: Emergency Medicine

## 2020-09-13 ENCOUNTER — Emergency Department (HOSPITAL_BASED_OUTPATIENT_CLINIC_OR_DEPARTMENT_OTHER): Payer: 59

## 2020-09-13 ENCOUNTER — Other Ambulatory Visit: Payer: Self-pay

## 2020-09-13 ENCOUNTER — Encounter (HOSPITAL_BASED_OUTPATIENT_CLINIC_OR_DEPARTMENT_OTHER): Payer: Self-pay

## 2020-09-13 DIAGNOSIS — U071 COVID-19: Secondary | ICD-10-CM | POA: Insufficient documentation

## 2020-09-13 DIAGNOSIS — R0602 Shortness of breath: Secondary | ICD-10-CM | POA: Insufficient documentation

## 2020-09-13 DIAGNOSIS — R079 Chest pain, unspecified: Secondary | ICD-10-CM | POA: Insufficient documentation

## 2020-09-13 DIAGNOSIS — Z5321 Procedure and treatment not carried out due to patient leaving prior to being seen by health care provider: Secondary | ICD-10-CM | POA: Insufficient documentation

## 2020-09-13 LAB — CBC
HCT: 40.5 % (ref 36.0–46.0)
Hemoglobin: 13.2 g/dL (ref 12.0–15.0)
MCH: 28.1 pg (ref 26.0–34.0)
MCHC: 32.6 g/dL (ref 30.0–36.0)
MCV: 86.4 fL (ref 80.0–100.0)
Platelets: 287 10*3/uL (ref 150–400)
RBC: 4.69 MIL/uL (ref 3.87–5.11)
RDW: 15.7 % — ABNORMAL HIGH (ref 11.5–15.5)
WBC: 4.5 10*3/uL (ref 4.0–10.5)
nRBC: 0 % (ref 0.0–0.2)

## 2020-09-13 LAB — BASIC METABOLIC PANEL
Anion gap: 10 (ref 5–15)
BUN: 9 mg/dL (ref 6–20)
CO2: 27 mmol/L (ref 22–32)
Calcium: 8.8 mg/dL — ABNORMAL LOW (ref 8.9–10.3)
Chloride: 101 mmol/L (ref 98–111)
Creatinine, Ser: 0.83 mg/dL (ref 0.44–1.00)
GFR, Estimated: >60 mL/min (ref >60)
Glucose, Bld: 96 mg/dL (ref 70–99)
Potassium: 3.2 mmol/L — ABNORMAL LOW (ref 3.5–5.1)
Sodium: 138 mmol/L (ref 135–145)

## 2020-09-13 LAB — PREGNANCY, URINE: Preg Test, Ur: NEGATIVE

## 2020-09-13 LAB — TROPONIN I (HIGH SENSITIVITY): Troponin I (High Sensitivity): <2 ng/L (ref <18)

## 2020-09-13 NOTE — ED Triage Notes (Signed)
Pt c/o CP, SOB-states she was dx with covid -NAD-steady gait

## 2020-09-13 NOTE — ED Triage Notes (Signed)
Pt returned to ED for c/o CP-pt +covid-NAD-steady gait

## 2020-09-14 ENCOUNTER — Emergency Department (HOSPITAL_BASED_OUTPATIENT_CLINIC_OR_DEPARTMENT_OTHER)
Admission: EM | Admit: 2020-09-14 | Discharge: 2020-09-14 | Disposition: A | Discharge status: Home / Self Care | Payer: 59 | Source: Home / Self Care | Attending: Emergency Medicine | Admitting: Emergency Medicine

## 2020-09-14 DIAGNOSIS — U071 COVID-19: Secondary | ICD-10-CM

## 2020-09-14 NOTE — Discharge Instructions (Addendum)
You were evaluated in the Emergency Department and after careful evaluation, we did not find any emergent condition requiring admission or further testing in the hospital.  Your exam/testing today is overall reassuring.  Your symptoms are consistent with COVID-19 infection.  Your labs and x-ray were reassuring.  Please continue with home quarantine per CDC recommendations.  Please return to the Emergency Department if you experience any worsening of your condition.   Thank you for allowing [place] to be a part of your care.

## 2020-09-14 NOTE — ED Provider Notes (Signed)
[facility] Emergency Department Provider Note MRN:  [id number]  Arrival date & time: 09/14/20     Chief Complaint   Covid Positive   History of Present Illness   Rebecca Benjamin is a 30 y.o. year-old female with no pertinent past medical history presenting to the ED with chief complaint of Covid.  Patient has been having cold/flulike symptoms for the past 6 days, tested positive for Covid 3 days ago.  Has been having some dyspnea on exertion and some soreness in the chest with coughing today and is concerned her, wanted to be evaluated for possible Covid pneumonia.  She denies any leg swelling, no abdominal pain, no other complaints  Review of Systems  A complete 10 system review of systems was obtained and all systems are negative except as noted in the HPI and PMH.   Patient's Health History    Past Medical History:  Diagnosis Date  . Ectopic pregnancy   . H/O cold sores   . Headache    migraines     Past Surgical History:  Procedure Laterality Date  . DILATION AND CURETTAGE OF UTERUS    . DILATION AND EVACUATION N/A 10/26/2016   Procedure: DILATATION AND EVACUATION;  Surgeon: Crawford Givens, MD;  Location: [facility];  Service: Gynecology;  Laterality: N/A;  . FOOT SURGERY Left 2014 and 2015  . LAPAROSCOPY Left 10/26/2016   Procedure: LAPAROSCOPY OPERATIVE;  Surgeon: Crawford Givens, MD;  Location: [facility];  Service: Gynecology;  Laterality: Left;  removal of ectopic pregnancy   . TONSILLECTOMY      Family History  Problem Relation Age of Onset  . Mitral valve prolapse Mother   . Diabetes Mother   . Depression Mother   . Hypertension Mother   . Healthy Father   . Stroke Maternal Grandmother   . Cancer Maternal Grandfather   . Migraines Paternal Grandmother     Social History   Socioeconomic History  . Marital status: Married    Spouse name: Not on file  . Number of children: 0  . Years of education: 24  . Highest education level: Not on  file  Occupational History  . Occupation: Materials engineer  Tobacco Use  . Smoking status: Never Smoker  . Smokeless tobacco: Never Used  Vaping Use  . Vaping Use: Never used  Substance and Sexual Activity  . Alcohol use: Yes    Comment: Socially  . Drug use: No  . Sexual activity: Not on file  Other Topics Concern  . Not on file  Social History Narrative   Fun: Elbert Ewings out with her fiance, shop   Denies religious beliefs effecting health care.    Feels safe at home and denies abuse.    Social Determinants of Health   Financial Resource Strain: Not on file  Food Insecurity: Not on file  Transportation Needs: Not on file  Physical Activity: Not on file  Stress: Not on file  Social Connections: Not on file  Intimate Partner Violence: Not on file     Physical Exam   Vitals:   09/13/20 2301 09/14/20 0223  BP: (!) 146/94 (!) 123/95  Pulse: 90 89  Resp: 18 18  Temp: 98.5 F (36.9 C) 98.6 F (37 C)  SpO2: 100% 100%    CONSTITUTIONAL: Well-appearing, NAD NEURO:  Alert and oriented x 3, no focal deficits EYES:  eyes equal and reactive ENT/NECK:  no LAD, no JVD CARDIO: Regular rate, well-perfused, normal S1 and S2 PULM:  CTAB no wheezing or rhonchi GI/GU:  normal bowel sounds, non-distended, non-tender MSK/SPINE:  No gross deformities, no edema SKIN:  no rash, atraumatic PSYCH:  Appropriate speech and behavior  *Additional and/or pertinent findings included in MDM below  Diagnostic and Interventional Summary    EKG Interpretation  Date/Time:    Ventricular Rate:    PR Interval:    QRS Duration:   QT Interval:    QTC Calculation:   R Axis:     Text Interpretation:        Labs Reviewed - No data to display  No orders to display    Medications - No data to display   Procedures  /  Critical Care Procedures  ED Course and Medical Decision Making  I have reviewed the triage vital signs, the nursing notes, and pertinent available records from the  EMR.  Listed above are laboratory and imaging tests that I personally ordered, reviewed, and interpreted and then considered in my medical decision making (see below for details).  No increased work of breathing, normal vital signs, x-ray and labs from earlier today are reassuring, no evidence of DVT on exam, appropriate for discharge     Rebecca Benjamin was evaluated in Emergency Department on 09/14/2020 for the symptoms described in the history of present illness. She was evaluated in the context of the global COVID-19 pandemic, which necessitated consideration that the patient might be at risk for infection with the SARS-CoV-2 virus that causes COVID-19. Institutional protocols and algorithms that pertain to the evaluation of patients at risk for COVID-19 are in a state of rapid change based on information released by regulatory bodies including the CDC and federal and state organizations. These policies and algorithms were followed during the patient's care in the ED.   Elmer Sow. Pilar Plate, MD [facility] Emergency Medicine [facility] [email or web address]  Final Clinical Impressions(s) / ED Diagnoses     ICD-10-CM   1. COVID-19  U07.1     ED Discharge Orders    None       Discharge Instructions Discussed with and Provided to Patient:     Discharge Instructions     You were evaluated in the Emergency Department and after careful evaluation, we did not find any emergent condition requiring admission or further testing in the hospital.  Your exam/testing today is overall reassuring.  Your symptoms are consistent with COVID-19 infection.  Your labs and x-ray were reassuring.  Please continue with home quarantine per CDC recommendations.  Please return to the Emergency Department if you experience any worsening of your condition.   Thank you for allowing [place] to be a part of your care.      Sabas Sous, MD 09/14/20 [phone]

## 2021-03-30 ENCOUNTER — Emergency Department (HOSPITAL_BASED_OUTPATIENT_CLINIC_OR_DEPARTMENT_OTHER)
Admission: EM | Admit: 2021-03-30 | Discharge: 2021-03-30 | Disposition: A | Discharge status: Home / Self Care | Payer: 59 | Attending: Emergency Medicine | Admitting: Emergency Medicine

## 2021-03-30 ENCOUNTER — Encounter (HOSPITAL_BASED_OUTPATIENT_CLINIC_OR_DEPARTMENT_OTHER): Payer: Self-pay | Admitting: *Deleted

## 2021-03-30 ENCOUNTER — Emergency Department (HOSPITAL_BASED_OUTPATIENT_CLINIC_OR_DEPARTMENT_OTHER): Payer: 59

## 2021-03-30 ENCOUNTER — Other Ambulatory Visit: Payer: Self-pay

## 2021-03-30 DIAGNOSIS — N939 Abnormal uterine and vaginal bleeding, unspecified: Secondary | ICD-10-CM | POA: Insufficient documentation

## 2021-03-30 DIAGNOSIS — Z91040 Latex allergy status: Secondary | ICD-10-CM | POA: Diagnosis not present

## 2021-03-30 DIAGNOSIS — D259 Leiomyoma of uterus, unspecified: Secondary | ICD-10-CM | POA: Insufficient documentation

## 2021-03-30 DIAGNOSIS — D219 Benign neoplasm of connective and other soft tissue, unspecified: Secondary | ICD-10-CM

## 2021-03-30 LAB — CBC WITH DIFFERENTIAL/PLATELET
Abs Immature Granulocytes: 0.01 10*3/uL (ref 0.00–0.07)
Basophils Absolute: 0 10*3/uL (ref 0.0–0.1)
Basophils Relative: 0 %
Eosinophils Absolute: 0 10*3/uL (ref 0.0–0.5)
Eosinophils Relative: 0 %
HCT: 36.1 % (ref 36.0–46.0)
Hemoglobin: 12 g/dL (ref 12.0–15.0)
Immature Granulocytes: 0 %
Lymphocytes Relative: 20 %
Lymphs Abs: 1.6 10*3/uL (ref 0.7–4.0)
MCH: 29.2 pg (ref 26.0–34.0)
MCHC: 33.2 g/dL (ref 30.0–36.0)
MCV: 87.8 fL (ref 80.0–100.0)
Monocytes Absolute: 0.4 10*3/uL (ref 0.1–1.0)
Monocytes Relative: 6 %
Neutro Abs: 5.9 10*3/uL (ref 1.7–7.7)
Neutrophils Relative %: 74 %
Platelets: 253 10*3/uL (ref 150–400)
RBC: 4.11 MIL/uL (ref 3.87–5.11)
RDW: 14.6 % (ref 11.5–15.5)
WBC: 8.1 10*3/uL (ref 4.0–10.5)
nRBC: 0 % (ref 0.0–0.2)

## 2021-03-30 LAB — BASIC METABOLIC PANEL
Anion gap: 10 (ref 5–15)
BUN: 11 mg/dL (ref 6–20)
CO2: 25 mmol/L (ref 22–32)
Calcium: 8.9 mg/dL (ref 8.9–10.3)
Chloride: 102 mmol/L (ref 98–111)
Creatinine, Ser: 0.66 mg/dL (ref 0.44–1.00)
GFR, Estimated: >60 mL/min (ref >60)
Glucose, Bld: 90 mg/dL (ref 70–99)
Potassium: 3.4 mmol/L — ABNORMAL LOW (ref 3.5–5.1)
Sodium: 137 mmol/L (ref 135–145)

## 2021-03-30 LAB — URINALYSIS, ROUTINE W REFLEX MICROSCOPIC
Bilirubin Urine: NEGATIVE
Glucose, UA: NEGATIVE mg/dL
Ketones, ur: NEGATIVE mg/dL
Leukocytes,Ua: NEGATIVE
Nitrite: NEGATIVE
Protein, ur: NEGATIVE mg/dL
Specific Gravity, Urine: 1.02 (ref 1.005–1.030)
pH: 6.5 (ref 5.0–8.0)

## 2021-03-30 LAB — URINALYSIS, MICROSCOPIC (REFLEX): WBC, UA: NONE SEEN WBC/hpf (ref 0–5)

## 2021-03-30 LAB — PREGNANCY, URINE: Preg Test, Ur: NEGATIVE

## 2021-03-30 LAB — WET PREP, GENITAL
Clue Cells Wet Prep HPF POC: NONE SEEN
Sperm: NONE SEEN
Trich, Wet Prep: NONE SEEN
Yeast Wet Prep HPF POC: NONE SEEN

## 2021-03-30 LAB — HCG, QUANTITATIVE, PREGNANCY: hCG, Beta Chain, Quant, S: <1 m[IU]/mL (ref <5)

## 2021-03-30 NOTE — ED Provider Notes (Signed)
[facility] Provider Note   CSN: [id number] Arrival date & time: 03/30/21  1652     History Chief Complaint  Patient presents with   Vaginal Bleeding    Rebecca Benjamin is a 30 y.o. female.  The history is provided by the patient.  Vaginal Bleeding Quality:  Clots Severity:  Mild Onset quality:  Gradual Duration:  8 hours Timing:  Constant Progression:  Unchanged Chronicity:  New Context: spontaneously   Relieved by:  Nothing Worsened by:  Nothing Associated symptoms: abdominal pain (cramping)   Associated symptoms: no back pain, no dizziness, no dyspareunia, no dysuria, no fatigue, no fever, no nausea and no vaginal discharge   Risk factors: hx of ectopic pregnancy       Past Medical History:  Diagnosis Date   Ectopic pregnancy    H/O cold sores    Headache    migraines     Patient Active Problem List   Diagnosis Date Noted   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   Obstetrical laceration -  2nd degreePerineal; left Labial minora 01/25/2018   Maternal anemia, with delivery 01/25/2018   Postpartum care following vaginal delivery 01/24/2018   Ectopic pregnancy 10/2016 01/02/2017   Cluster headache, not intractable 01/11/2016   Lipoma 04/25/2015   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    Past Surgical History:  Procedure Laterality Date   DILATION AND CURETTAGE OF UTERUS     DILATION AND EVACUATION N/A 10/26/2016   Procedure: DILATATION AND EVACUATION;  Surgeon: Crawford Givens, MD;  Location: [facility];  Service: Gynecology;  Laterality: N/A;   FOOT SURGERY Left 2014 and 2015   LAPAROSCOPY Left 10/26/2016   Procedure: LAPAROSCOPY OPERATIVE;  Surgeon: Crawford Givens, MD;  Location: [facility];  Service: Gynecology;  Laterality: Left;  removal of ectopic pregnancy    TONSILLECTOMY       OB History     Gravida  4   Para  1   Term  1   Preterm      AB  2   Living  1      SAB  1   IAB  0   Ectopic  1   Multiple      Live Births  1            Family History  Problem Relation Age of Onset   Mitral valve prolapse Mother    Diabetes Mother    Depression Mother    Hypertension Mother    Healthy Father    Stroke Maternal Grandmother    Cancer Maternal Grandfather    Migraines Paternal Grandmother     Social History   Tobacco Use   Smoking status: Never   Smokeless tobacco: Never  Vaping Use   Vaping Use: Never used  Substance Use Topics   Alcohol use: Yes    Comment: Socially   Drug use: No    Home Medications Prior to Admission medications   Medication Sig Start Date End Date Taking? Authorizing Provider  buPROPion (WELLBUTRIN) 75 MG tablet bupropion HCl 75 mg tablet  TAKE 1 TABLET BY MOUTH TWICE DAILY    [provider]  iron polysaccharides (NIFEREX) 150 MG capsule Take 1 capsule (150 mg total) by mouth daily. Patient not taking: Reported on 07/12/2018 01/27/18 09/09/20  Juliene Pina, CNM    Allergies    Amoxicillin, Imitrex [sumatriptan], and Latex  Review of Systems   Review of Systems  Constitutional:  Negative for chills, fatigue and fever.  HENT:  Negative for ear pain and sore throat.   Eyes:  Negative for pain and visual disturbance.  Respiratory:  Negative for cough and shortness of breath.   Cardiovascular:  Negative for chest pain and palpitations.  Gastrointestinal:  Positive for abdominal pain (cramping). Negative for nausea and vomiting.  Genitourinary:  Positive for vaginal bleeding. Negative for dyspareunia, dysuria, hematuria and vaginal discharge.  Musculoskeletal:  Negative for arthralgias and back pain.  Skin:  Negative for color change and rash.  Neurological:  Negative for dizziness, seizures and syncope.  All other systems reviewed and are negative.  Physical Exam Updated Vital Signs BP 130/82 (BP Location: Right Arm)   Pulse 77   Temp 98.9 F (37.2 C) (Oral)   Resp 18   Ht 5\' 6"  (1.676 m)   Wt 64 kg   LMP 03/12/2021   SpO2 99%   BMI 22.77 kg/m   Physical  Exam Vitals and nursing note reviewed.  Constitutional:      General: She is not in acute distress.    Appearance: She is well-developed. She is not ill-appearing.  HENT:     Head: Normocephalic and atraumatic.     Nose: Nose normal.     Mouth/Throat:     Mouth: Mucous membranes are moist.  Eyes:     Extraocular Movements: Extraocular movements intact.     Conjunctiva/sclera: Conjunctivae normal.     Pupils: Pupils are equal, round, and reactive to light.  Cardiovascular:     Rate and Rhythm: Normal rate and regular rhythm.     Pulses: Normal pulses.     Heart sounds: Normal heart sounds. No murmur heard. Pulmonary:     Effort: Pulmonary effort is normal. No respiratory distress.     Breath sounds: Normal breath sounds.  Abdominal:     Palpations: Abdomen is soft.     Tenderness: There is no abdominal tenderness.  Genitourinary:    Comments: GU exam is overall unremarkable.  No cervical motion tenderness, cervix is closed, scant blood in the vaginal vault with some small clots Musculoskeletal:     Cervical back: Normal range of motion and neck supple.  Skin:    General: Skin is warm and dry.     Capillary Refill: Capillary refill takes less than 2 seconds.  Neurological:     General: No focal deficit present.     Mental Status: She is alert.    ED Results / Procedures / Treatments   Labs (all labs ordered are listed, but only abnormal results are displayed) Labs Reviewed  WET PREP, GENITAL - Abnormal; Notable for the following components:      Result Value   WBC, Wet Prep HPF POC FEW (*)    All other components within normal limits  URINALYSIS, ROUTINE W REFLEX MICROSCOPIC - Abnormal; Notable for the following components:   Hgb urine dipstick LARGE (*)    All other components within normal limits  BASIC METABOLIC PANEL - Abnormal; Notable for the following components:   Potassium 3.4 (*)    All other components within normal limits  URINALYSIS, MICROSCOPIC (REFLEX) -  Abnormal; Notable for the following components:   Bacteria, UA RARE (*)    All other components within normal limits  PREGNANCY, URINE  HCG, QUANTITATIVE, PREGNANCY  CBC WITH DIFFERENTIAL/PLATELET  GC/CHLAMYDIA PROBE AMP ([facility]) NOT AT [facility]    EKG None  Radiology US PELVIC COMPLETE W TRANSVAGINAL AND TORSION R/O  Result Date: 03/30/2021 CLINICAL DATA:  Vaginal bleeding  EXAM: TRANSABDOMINAL AND TRANSVAGINAL ULTRASOUND OF PELVIS DOPPLER ULTRASOUND OF OVARIES TECHNIQUE: Both transabdominal and transvaginal ultrasound examinations of the pelvis were performed. Transabdominal technique was performed for global imaging of the pelvis including uterus, ovaries, adnexal regions, and pelvic cul-de-sac. It was necessary to proceed with endovaginal exam following the transabdominal exam to visualize the endometrium. Color and duplex Doppler ultrasound was utilized to evaluate blood flow to the ovaries. COMPARISON:  01/02/2017 FINDINGS: Uterus Measurements: 9.8 x 6.0 x 7.3 cm = volume: 224 mL. Heterogeneous appearance with [place] blind shadowing, favoring uterine adenomyosis. Possible 1.7 x 1.5 x 2.0 cm intramural fibroid in the anterior body. Endometrium Thickness: 6 mm.  No focal abnormality visualized. Right ovary Measurements: 3.9 x 2.2 x 3.3 cm = volume: 14.8 mL. Normal appearance/no adnexal mass. Left ovary Measurements: 3.9 x 2.6 x 3.6 cm = volume: 18.9 mL. Normal appearance/no adnexal mass. Pulsed Doppler evaluation of both ovaries demonstrates normal low-resistance arterial and venous waveforms. Other findings Trace pelvic fluid, likely physiologic. IMPRESSION: No evidence of ovarian torsion. Suspected uterine adenomyosis. Possible underlying 2.0 cm intramural fibroid in the anterior uterine body. Endometrial complex measures 6 mm, within normal limits. Electronically Signed   By: Julian Hy M.D.   On: 03/30/2021 20:29    Procedures Procedures   Medications Ordered in ED Medications -  No data to display  ED Course  I have reviewed the triage vital signs and the nursing notes.  Pertinent labs & imaging results that were available during my care of the patient were reviewed by me and considered in my medical decision making (see chart for details).    MDM Rules/Calculators/A&P                           Rebecca Benjamin is here for vaginal bleeding.  History of ectopic pregnancies.  Had at home negative pregnancy test 2 days ago.  Pregnancy test here today is negative.  We will get a quantitative hCG to confirm given her history of multiple ectopic pregnancies.  We will also get an ultrasound and basic labs.  Bleeding started today.  Starting to have some clots.  GU exam is overall unremarkable.  Scant blood in the vaginal vault with some small clots.  Hemodynamically she stable.  Abdomen is overall benign.  Overall suspect abnormal uterine bleeding.  Her menstrual cycle is supposed to begin later this week.  Pregnancy test negative both with urine pregnancy as well as for pregnancy test.  Quantitative less than 1.  No significant anemia, electrolyte abnormality, kidney injury otherwise.  Ultrasound showed no evidence of ectopic or torsion.  There is a small fibroid that may be causing abnormal uterine bleeding.  Recommend follow-up with OB/GYN.  Discharged in good condition.  This chart was dictated using voice recognition software.  Despite best efforts to proofread,  errors can occur which can change the documentation meaning.   Final Clinical Impression(s) / ED Diagnoses Final diagnoses:  Fibroid  Abnormal uterine bleeding (AUB)    Rx / DC Orders ED Discharge Orders     None        Lennice Sites, DO 03/30/21 2036

## 2021-03-30 NOTE — ED Notes (Addendum)
Patient states she started bleeding this am, states she finished her period 2 weeks ago. States she has a history of 2 prior ectopic pregnancies and multiple miscarriages.

## 2021-03-30 NOTE — ED Notes (Signed)
Ultrasound being performed at bedside.

## 2021-03-30 NOTE — ED Notes (Signed)
Bedside ultrasound continues.

## 2021-03-30 NOTE — ED Notes (Signed)
Ultrasound remains at bedside, will obtain vitals when imaging is completed.

## 2021-03-30 NOTE — ED Triage Notes (Signed)
Vaginal bleeding today. Her LMP was 07/04. Her home pregnancy test was negative.

## 2021-03-31 LAB — GC/CHLAMYDIA PROBE AMP (~~LOC~~) NOT AT ARMC
Chlamydia: NEGATIVE
Comment: NEGATIVE
Comment: NORMAL
Neisseria Gonorrhea: NEGATIVE

## 2022-09-10 NOTE — L&D Delivery Note (Signed)
Delivery Note At 11:03 PM a viable and healthy female was delivered via Vaginal, Spontaneous (Presentation: Right Occiput Anterior).  APGAR: 5, 9; weight  pending.   Placenta status: Spontaneous, Intact.not sent  Cord:  cord around neck and body not reducible 3 vessels with the following complications: None.  Cord pH: n/a  Anesthesia: Epidural;Local Episiotomy: None Lacerations: 2nd degree;Perineal Suture Repair: 3.0 chromic Est. Blood Loss (mL): 100  Mom to postpartum.  Baby to Couplet care / Skin to Skin.  Aahil Fredin A Ludger Bones 08/14/2023, 11:36 PM

## 2022-10-24 ENCOUNTER — Ambulatory Visit: Payer: Self-pay | Admitting: Family Medicine

## 2022-10-31 ENCOUNTER — Ambulatory Visit: Payer: 59 | Admitting: Nurse Practitioner

## 2022-11-01 ENCOUNTER — Ambulatory Visit: Payer: 59 | Admitting: Internal Medicine

## 2022-12-13 ENCOUNTER — Other Ambulatory Visit (HOSPITAL_COMMUNITY): Payer: Self-pay

## 2022-12-13 ENCOUNTER — Other Ambulatory Visit: Payer: Self-pay

## 2022-12-13 MED ORDER — HEPARIN SODIUM (PORCINE) 5000 UNIT/ML IJ SOLN
5000.0000 [IU] | Freq: Two times a day (BID) | INTRAMUSCULAR | 6 refills | Status: DC
  Filled 2022-12-13: qty 40, 20d supply, fill #0
  Filled 2022-12-13: qty 20, 10d supply, fill #0
  Filled 2022-12-14: qty 60, 30d supply, fill #0

## 2022-12-14 ENCOUNTER — Other Ambulatory Visit (HOSPITAL_COMMUNITY): Payer: Self-pay

## 2022-12-14 ENCOUNTER — Other Ambulatory Visit: Payer: Self-pay

## 2023-01-03 ENCOUNTER — Other Ambulatory Visit: Payer: Self-pay

## 2023-01-03 ENCOUNTER — Inpatient Hospital Stay (HOSPITAL_BASED_OUTPATIENT_CLINIC_OR_DEPARTMENT_OTHER)
Admission: EM | Admit: 2023-01-03 | Discharge: 2023-01-04 | Disposition: A | Discharge status: Home / Self Care | Payer: No Typology Code available for payment source | Attending: Obstetrics & Gynecology | Admitting: Obstetrics & Gynecology

## 2023-01-03 ENCOUNTER — Encounter (HOSPITAL_BASED_OUTPATIENT_CLINIC_OR_DEPARTMENT_OTHER): Payer: Self-pay | Admitting: Emergency Medicine

## 2023-01-03 DIAGNOSIS — D259 Leiomyoma of uterus, unspecified: Secondary | ICD-10-CM | POA: Insufficient documentation

## 2023-01-03 DIAGNOSIS — O09291 Supervision of pregnancy with other poor reproductive or obstetric history, first trimester: Secondary | ICD-10-CM | POA: Insufficient documentation

## 2023-01-03 DIAGNOSIS — Z3A01 Less than 8 weeks gestation of pregnancy: Secondary | ICD-10-CM | POA: Diagnosis not present

## 2023-01-03 DIAGNOSIS — Z3A08 8 weeks gestation of pregnancy: Secondary | ICD-10-CM | POA: Insufficient documentation

## 2023-01-03 DIAGNOSIS — O3411 Maternal care for benign tumor of corpus uteri, first trimester: Secondary | ICD-10-CM | POA: Insufficient documentation

## 2023-01-03 DIAGNOSIS — O219 Vomiting of pregnancy, unspecified: Secondary | ICD-10-CM | POA: Diagnosis not present

## 2023-01-03 DIAGNOSIS — K5900 Constipation, unspecified: Secondary | ICD-10-CM | POA: Diagnosis not present

## 2023-01-03 DIAGNOSIS — O99611 Diseases of the digestive system complicating pregnancy, first trimester: Secondary | ICD-10-CM | POA: Diagnosis present

## 2023-01-03 LAB — COMPREHENSIVE METABOLIC PANEL
ALT: 11 U/L (ref 0–44)
AST: 17 U/L (ref 15–41)
Albumin: 4.2 g/dL (ref 3.5–5.0)
Alkaline Phosphatase: 38 U/L (ref 38–126)
Anion gap: 10 (ref 5–15)
BUN: 16 mg/dL (ref 6–20)
CO2: 21 mmol/L — ABNORMAL LOW (ref 22–32)
Calcium: 9.5 mg/dL (ref 8.9–10.3)
Chloride: 99 mmol/L (ref 98–111)
Creatinine, Ser: 0.59 mg/dL (ref 0.44–1.00)
GFR, Estimated: >60 mL/min (ref >60)
Glucose, Bld: 94 mg/dL (ref 70–99)
Potassium: 4.3 mmol/L (ref 3.5–5.1)
Sodium: 130 mmol/L — ABNORMAL LOW (ref 135–145)
Total Bilirubin: 0.2 mg/dL — ABNORMAL LOW (ref 0.3–1.2)
Total Protein: 8.1 g/dL (ref 6.5–8.1)

## 2023-01-03 LAB — CBC WITH DIFFERENTIAL/PLATELET
Abs Immature Granulocytes: 0.04 10*3/uL (ref 0.00–0.07)
Basophils Absolute: 0 10*3/uL (ref 0.0–0.1)
Basophils Relative: 0 %
Eosinophils Absolute: 0 10*3/uL (ref 0.0–0.5)
Eosinophils Relative: 0 %
HCT: 39.6 % (ref 36.0–46.0)
Hemoglobin: 13.9 g/dL (ref 12.0–15.0)
Immature Granulocytes: 0 %
Lymphocytes Relative: 16 %
Lymphs Abs: 1.6 10*3/uL (ref 0.7–4.0)
MCH: 31.7 pg (ref 26.0–34.0)
MCHC: 35.1 g/dL (ref 30.0–36.0)
MCV: 90.4 fL (ref 80.0–100.0)
Monocytes Absolute: 0.6 10*3/uL (ref 0.1–1.0)
Monocytes Relative: 6 %
Neutro Abs: 8 10*3/uL — ABNORMAL HIGH (ref 1.7–7.7)
Neutrophils Relative %: 78 %
Platelets: 311 10*3/uL (ref 150–400)
RBC: 4.38 MIL/uL (ref 3.87–5.11)
RDW: 11.8 % (ref 11.5–15.5)
WBC: 10.3 10*3/uL (ref 4.0–10.5)
nRBC: 0 % (ref 0.0–0.2)

## 2023-01-03 LAB — URINALYSIS, ROUTINE W REFLEX MICROSCOPIC
Bilirubin Urine: NEGATIVE
Glucose, UA: NEGATIVE mg/dL
Hgb urine dipstick: NEGATIVE
Ketones, ur: NEGATIVE mg/dL
Leukocytes,Ua: NEGATIVE
Nitrite: NEGATIVE
Protein, ur: NEGATIVE mg/dL
Specific Gravity, Urine: 1.025 (ref 1.005–1.030)
pH: 6 (ref 5.0–8.0)

## 2023-01-03 LAB — PREGNANCY, URINE: Preg Test, Ur: POSITIVE — AB

## 2023-01-03 LAB — LIPASE, BLOOD: Lipase: 30 U/L (ref 11–51)

## 2023-01-03 MED ORDER — ONDANSETRON HCL 4 MG/2ML IJ SOLN: 4.0000 mg | Freq: Once | INTRAMUSCULAR | Status: DC

## 2023-01-03 MED ORDER — LACTATED RINGERS IV BOLUS
1000.0000 mL | Freq: Once | INTRAVENOUS | Status: AC
  Administered 2023-01-04: 1000 mL via INTRAVENOUS

## 2023-01-03 NOTE — ED Triage Notes (Signed)
Pt states is 8 weeks and has "HG" she is constipated, dehydrated, unable to keep anything down. Has tried multiple meds for nausea and constipation with no relief.

## 2023-01-04 ENCOUNTER — Encounter (HOSPITAL_COMMUNITY): Payer: Self-pay | Admitting: Obstetrics & Gynecology

## 2023-01-04 ENCOUNTER — Emergency Department (HOSPITAL_BASED_OUTPATIENT_CLINIC_OR_DEPARTMENT_OTHER): Payer: No Typology Code available for payment source

## 2023-01-04 DIAGNOSIS — O219 Vomiting of pregnancy, unspecified: Secondary | ICD-10-CM

## 2023-01-04 DIAGNOSIS — Z3A01 Less than 8 weeks gestation of pregnancy: Secondary | ICD-10-CM

## 2023-01-04 DIAGNOSIS — K5900 Constipation, unspecified: Secondary | ICD-10-CM

## 2023-01-04 MED ORDER — METOCLOPRAMIDE HCL 5 MG/ML IJ SOLN
5.0000 mg | Freq: Once | INTRAMUSCULAR | Status: AC
  Administered 2023-01-04: 5 mg via INTRAVENOUS
  Filled 2023-01-04: qty 2

## 2023-01-04 MED ORDER — FLEET ENEMA 7-19 GM/118ML RE ENEM
1.0000 | ENEMA | Freq: Once | RECTAL | Status: DC
  Filled 2023-01-04: qty 1

## 2023-01-04 MED ORDER — SODIUM CHLORIDE 0.9 % IV SOLN
12.5000 mg | Freq: Once | INTRAVENOUS | Status: AC
  Administered 2023-01-04: 12.5 mg via INTRAVENOUS
  Filled 2023-01-04: qty 12.5

## 2023-01-04 MED ORDER — DIPHENHYDRAMINE HCL 50 MG/ML IJ SOLN
12.5000 mg | Freq: Once | INTRAMUSCULAR | Status: AC
  Administered 2023-01-04: 12.5 mg via INTRAVENOUS
  Filled 2023-01-04: qty 1

## 2023-01-04 MED ORDER — SENNOSIDES-DOCUSATE SODIUM 8.6-50 MG PO TABS: 1.0000 | ORAL_TABLET | Freq: Every day | ORAL | 0 refills | Status: DC

## 2023-01-04 MED ORDER — PSYLLIUM 95 % PO PACK: 1.0000 | PACK | Freq: Every day | ORAL | 0 refills | Status: AC

## 2023-01-04 MED ORDER — METHYLPREDNISOLONE SODIUM SUCC 125 MG IJ SOLR: 125.0000 mg | Freq: Once | INTRAMUSCULAR | Status: DC

## 2023-01-04 MED ORDER — POLYETHYLENE GLYCOL 3350 17 G PO PACK: 17.0000 g | PACK | Freq: Two times a day (BID) | ORAL | 0 refills | Status: AC

## 2023-01-04 MED ORDER — SCOPOLAMINE 1 MG/3DAYS TD PT72
1.0000 | MEDICATED_PATCH | Freq: Once | TRANSDERMAL | Status: DC
  Administered 2023-01-04: 1.5 mg via TRANSDERMAL
  Filled 2023-01-04: qty 1

## 2023-01-04 MED ORDER — DIPHENHYDRAMINE HCL 50 MG/ML IJ SOLN: 12.5000 mg | Freq: Once | INTRAMUSCULAR | Status: DC | PRN

## 2023-01-04 MED ORDER — METOCLOPRAMIDE HCL 5 MG/ML IJ SOLN: 5.0000 mg | Freq: Once | INTRAMUSCULAR | Status: DC | PRN

## 2023-01-04 MED ORDER — LACTATED RINGERS IV SOLN: INTRAVENOUS | Status: DC

## 2023-01-04 MED ORDER — FAMOTIDINE 40 MG PO TABS: 40.0000 mg | ORAL_TABLET | Freq: Every evening | ORAL | 0 refills | Status: DC

## 2023-01-04 MED ORDER — METOCLOPRAMIDE HCL 10 MG PO TABS: 10.0000 mg | ORAL_TABLET | Freq: Four times a day (QID) | ORAL | 0 refills | Status: DC | PRN

## 2023-01-04 MED ORDER — SENNOSIDES-DOCUSATE SODIUM 8.6-50 MG PO TABS
2.0000 | ORAL_TABLET | Freq: Once | ORAL | Status: AC
  Administered 2023-01-04: 2 via ORAL
  Filled 2023-01-04: qty 2

## 2023-01-04 MED ORDER — DOXYLAMINE-PYRIDOXINE 10-10 MG PO TBEC: 1.0000 | DELAYED_RELEASE_TABLET | Freq: Every evening | ORAL | 0 refills | Status: DC

## 2023-01-04 MED ORDER — SORBITOL 70 % SOLN
960.0000 mL | TOPICAL_OIL | Freq: Once | ORAL | Status: AC
  Administered 2023-01-04: 960 mL via RECTAL
  Filled 2023-01-04: qty 240

## 2023-01-04 MED ORDER — LACTULOSE 10 GM/15ML PO SOLN
20.0000 g | Freq: Once | ORAL | Status: AC
  Administered 2023-01-04: 20 g via ORAL
  Filled 2023-01-04: qty 30

## 2023-01-04 MED ORDER — METHYLPREDNISOLONE SODIUM SUCC 125 MG IJ SOLR
125.0000 mg | Freq: Once | INTRAMUSCULAR | Status: AC
  Administered 2023-01-04: 125 mg via INTRAVENOUS
  Filled 2023-01-04: qty 2

## 2023-01-04 MED ORDER — SCOPOLAMINE 1 MG/3DAYS TD PT72: 1.0000 | MEDICATED_PATCH | TRANSDERMAL | 1 refills | Status: DC

## 2023-01-04 NOTE — ED Notes (Signed)
Patient returned from ultrasound at this time.

## 2023-01-04 NOTE — ED Notes (Signed)
Patient reports nausea has not improved after second dose of medication.  Did not want to attempt PO challenge at this time.

## 2023-01-04 NOTE — ED Notes (Signed)
Patient reports vomiting prior to d/c.  MD aware and additional medications ordered.

## 2023-01-04 NOTE — Discharge Instructions (Addendum)
Discontinue diclegis & phenergan. Take reglan, pepcid, & scopolamine patch as prescribed.             You have constipation which is hard stools that are difficult to pass. It is important to have regular bowel movements every 1-3 days that are soft and easy to pass. Hard stools increase your risk of hemorrhoids and are very uncomfortable.   To prevent constipation you can increase the amount of fiber in your diet. Examples of foods with fiber are leafy greens, whole grain breads, oatmeal and other grains.  It is also important to drink at least 64 ounces of water everyday.   If you have not had a bowel movement in 4-5 days, you made need to clean out your bowel.  This will help you establish normal movements through your bowel.    Miralax Clean out Take 8 capfuls of miralax in 64 oz of gatorade. You can use any fluid that appeals to you (gatorade, water, juice) Continue to drink at least 64 ounces of water throughout the day You can repeat with another 8 capfuls of miralax in 64 oz of gatorade if you are not having a large amount of stools You will need to be at home and close to a bathroom for about 8 hours when you do the above as you may need to go to the bathroom frequently.   After you are cleaned out: - Start Colace100mg  twice daily - Start Miralax once daily - Start a daily fiber supplement like metamucil or citrucel - You can safely use enemas in pregnancy  - if you are having diarrhea you can reduce to Colace once a day or miralax every other day or a 1/2 capful daily.     Safe Medications in Pregnancy   Acne: Benzoyl Peroxide Salicylic Acid  Backache/Headache: Tylenol: 2 regular strength every 4 hours OR              2 Extra strength every 6 hours  Colds/Coughs/Allergies: Benadryl (alcohol free) 25 mg every 6 hours as needed Breath right strips Claritin Cepacol throat lozenges Chloraseptic throat spray Cold-Eeze- up to three times per day Cough drops,  alcohol free Flonase (by prescription only) Guaifenesin Mucinex Robitussin DM (plain only, alcohol free) Saline nasal spray/drops Sudafed (pseudoephedrine) & Actifed ** use only after [redacted] weeks gestation and if you do not have high blood pressure Tylenol Vicks Vaporub Zinc lozenges Zyrtec   Constipation: Colace Ducolax suppositories Fleet enema Glycerin suppositories Metamucil Milk of magnesia Miralax Senokot Smooth move tea  Diarrhea: Kaopectate Imodium A-D  *NO pepto Bismol  Hemorrhoids: Anusol Anusol HC Preparation H Tucks  Indigestion: Tums Maalox Mylanta Zantac  Pepcid  Insomnia: Benadryl (alcohol free) 25mg  every 6 hours as needed Tylenol PM Unisom, no Gelcaps  Leg Cramps: Tums MagGel  Nausea/Vomiting:  Bonine Dramamine Emetrol Ginger extract Sea bands Meclizine  Nausea medication to take during pregnancy:  Unisom (doxylamine succinate 25 mg tablets) Take one tablet daily at bedtime. If symptoms are not adequately controlled, the dose can be increased to a maximum recommended dose of two tablets daily (1/2 tablet in the morning, 1/2 tablet mid-afternoon and one at bedtime). Vitamin B6 100mg  tablets. Take one tablet twice a day (up to 200 mg per day).  Skin Rashes: Aveeno products Benadryl cream or 25mg  every 6 hours as needed Calamine Lotion 1% cortisone cream  Yeast infection: Gyne-lotrimin 7 Monistat 7  Gum/tooth pain: Anbesol  **If taking multiple medications, please check labels to avoid duplicating  the same active ingredients **take medication as directed on the label ** Do not exceed 4000 mg of tylenol in 24 hours **Do not take medications that contain aspirin or ibuprofen

## 2023-01-04 NOTE — MAU Note (Signed)
.  Rebecca Benjamin is a 32 y.o. at [redacted]w[redacted]d here in MAU reporting: here from [facility] c/o N/V that started 2 weeks ago - has been taking nausea meds at home, but has not helped. N/V unrelieved by interventions completed at [place]. Denies pain, VB, abnormal discharge, or vaginal irritation. Reports emesis x4 in the past 24 hours.   Onset of complaint: 2 weeks  Pain score: 0 Vitals:   01/04/23 0559 01/04/23 0652  BP:  108/76  Pulse:  92  Resp:  17  Temp: 98.2 F (36.8 C) 98.4 F (36.9 C)  SpO2:  99%     FHT:NA  Lab orders placed from triage:  none

## 2023-01-04 NOTE — ED Provider Notes (Addendum)
[facility] Provider Note   CSN: [id number] Arrival date & time: 01/03/23  2242     History  Chief Complaint  Patient presents with   Emesis   Constipation    Rebecca Benjamin is a 32 y.o. female.  Who is approximately [redacted] weeks pregnant based off last transvaginal ultrasound who presents with persistent nausea, nonbloody nonbilious emesis over the past 2 weeks with associated constipation.Patient is unsure what number pregnancy this is but has 1 liveborn   child.  She says she has had previous hyperemesis gravidarum in the past with previous pregnancy.  She was given prescription for Phenergan rectally which she has been using without relief.  She used rectal Phenergan prior to presentation as well as Diclegis.  She is vomiting multiple times a day nonbloody nonbilious.  She is also not having good bowel movements.  She had a small bowel movement today still passing gas no history of abdominal surgeries.  However they have been doing enemas at home, docusate, senna, multiple laxatives without relief.  She has had no fevers, no chills, no pain with urination.  No vaginal bleeding or loss of fluids.  Abdominal cramping however no "real" pain patient complaining of.     Emesis Constipation Associated symptoms: vomiting        Home Medications Prior to Admission medications   Medication Sig Start Date End Date Taking? Authorizing Provider  Doxylamine-Pyridoxine 10-10 MG TBEC Take 1 tablet by mouth at bedtime. 01/04/23  Yes Mardene Sayer, MD  metoCLOPramide (REGLAN) 10 MG tablet Take 1 tablet (10 mg total) by mouth every 6 (six) hours as needed for nausea. 01/04/23  Yes Mardene Sayer, MD  polyethylene glycol (MIRALAX) 17 g packet Take 17 g by mouth 2 (two) times daily. 01/04/23 02/03/23 Yes Mardene Sayer, MD  psyllium (HYDROCIL/METAMUCIL) 95 % PACK Take 1 packet by mouth daily. 01/04/23 02/03/23 Yes Mardene Sayer, MD   senna-docusate (SENOKOT-S) 8.6-50 MG tablet Take 1 tablet by mouth daily. 01/04/23  Yes Mardene Sayer, MD  buPROPion (WELLBUTRIN) 75 MG tablet bupropion HCl 75 mg tablet  TAKE 1 TABLET BY MOUTH TWICE DAILY    [provider]  heparin 5000 UNIT/ML injection Inject 1 mL (5,000 Units total) into the skin 2 (two) times daily. 12/13/22   Fermin Schwab, MD  iron polysaccharides (NIFEREX) 150 MG capsule Take 1 capsule (150 mg total) by mouth daily. Patient not taking: Reported on 07/12/2018 01/27/18 09/09/20  Neta Mends, CNM      Allergies    Amoxicillin, Imitrex [sumatriptan], and Latex    Review of Systems   Review of Systems  Gastrointestinal:  Positive for constipation and vomiting.    Physical Exam Updated Vital Signs BP 93/66 (BP Location: Left Arm)   Pulse 91   Resp 16   Ht 5\' 5"  (1.651 m)   Wt 68 kg   SpO2 97%   BMI 24.96 kg/m  Physical Exam Constitutional: Alert and oriented.  Laying on side in bed with lights off looks uncomfortable but nontoxic Eyes: Conjunctivae are normal. ENT      Head: Normocephalic and atraumatic.      Nose: No congestion.      Mouth/Throat: Mucous membranes are moist.      Neck: No stridor. Cardiovascular: S1, S2,  Normal and symmetric distal pulses are present in all extremities.Warm and well perfused. Respiratory: Normal respiratory effort.  O2 sat 95 on RA Gastrointestinal: Soft  and nondistended with mild suprapubic tenderness/lower pelvic tenderness and right lower quadrant tenderness without rebound or guarding. Musculoskeletal: Normal range of motion in all extremities. Neurologic: Normal speech and language. No gross focal neurologic deficits are appreciated. Skin: Skin is warm, dry and intact. No rash noted. Psychiatric: Mood and affect are normal. Speech and behavior are normal.  ED Results / Procedures / Treatments   Labs (all labs ordered are listed, but only abnormal results are displayed) Labs Reviewed   COMPREHENSIVE METABOLIC PANEL - Abnormal; Notable for the following components:      Result Value   Sodium 130 (*)    CO2 21 (*)    Total Bilirubin 0.2 (*)    All other components within normal limits  CBC WITH DIFFERENTIAL/PLATELET - Abnormal; Notable for the following components:   Neutro Abs 8.0 (*)    All other components within normal limits  PREGNANCY, URINE - Abnormal; Notable for the following components:   Preg Test, Ur POSITIVE (*)    All other components within normal limits  LIPASE, BLOOD  URINALYSIS, ROUTINE W REFLEX MICROSCOPIC    EKG None  Radiology US APPENDIX (ABDOMEN LIMITED)  Result Date: 01/04/2023 CLINICAL DATA:  Seven weeks pregnant with right lower quadrant pain. EXAM: ULTRASOUND ABDOMEN LIMITED TECHNIQUE: Wallace Cullens scale imaging of the right lower quadrant was performed to evaluate for suspected appendicitis. Standard imaging planes and graded compression technique were utilized. COMPARISON:  None Available. FINDINGS: The appendix is not visualized. Ancillary findings: None. Factors affecting image quality: None. Other findings: None. IMPRESSION: Non visualization of the appendix. Non-visualization of appendix by [place] does not definitely exclude appendicitis. If there is sufficient clinical concern, consider abdomen pelvis MRI for further evaluation. Electronically Signed   By: Aram Candela M.D.   On: 01/04/2023 02:16   US OB LESS THAN 14 WEEKS WITH OB TRANSVAGINAL  Result Date: 01/04/2023 CLINICAL DATA:  Check viability. EXAM: OBSTETRIC <14 WK [place] AND TRANSVAGINAL OB US TECHNIQUE: Both transabdominal and transvaginal ultrasound examinations were performed for complete evaluation of the gestation as well as the maternal uterus, adnexal regions, and pelvic cul-de-sac. Transvaginal technique was performed to assess early pregnancy. COMPARISON:  None Available. FINDINGS: Intrauterine gestational sac: Single Yolk sac:  Visualized. Embryo:  Visualized. Cardiac Activity:  Visualized. Heart Rate: 138 bpm CRL:  9.4 mm   7 w   0 d                  [place] [facility]: August 23, 2023 Subchorionic hemorrhage:  None visualized. Maternal uterus/adnexae: Multiple small heterogeneous uterine fibroids are seen. The largest measures approximately 1.7 cm x 1.0 cm x 1.2 cm. The bilateral ovaries are visualized and are normal in appearance. No pelvic free fluid is seen. IMPRESSION: Single, viable intrauterine pregnancy at approximately 7 weeks and 0 days gestation by ultrasound evaluation. Electronically Signed   By: Aram Candela M.D.   On: 01/04/2023 02:15    Procedures Procedures    Medications Ordered in ED Medications  sodium phosphate (FLEET) 7-19 GM/118ML enema 1 enema (0 enemas Rectal Hold 01/04/23 0213)  lactated ringers bolus 1,000 mL (0 mLs Intravenous Stopped 01/04/23 0318)  metoCLOPramide (REGLAN) injection 5 mg (5 mg Intravenous Given 01/04/23 0213)  diphenhydrAMINE (BENADRYL) injection 12.5 mg (12.5 mg Intravenous Given 01/04/23 0211)  senna-docusate (Senokot-S) tablet 2 tablet (2 tablets Oral Given 01/04/23 0323)  lactulose (CHRONULAC) 10 GM/15ML solution 20 g (20 g Oral Given 01/04/23 0323)  metoCLOPramide (REGLAN) injection 5 mg (5 mg Intravenous Given  01/04/23 0459)    ED Course/ Medical Decision Making/ A&P Clinical Course as of 01/04/23 0543  Fri Jan 04, 2023  0432 At time of discharge, patient complaining of nausea again still not feeling well.  Will give another dose of IV Reglan.  If still feeling unwell will send to MAU. [VB]    Clinical Course User Index [VB] Mardene Sayer, MD                             Medical Decision Making Rebecca Benjamin is a 32 y.o. female.  Who is approximately [redacted] weeks pregnant based off last transvaginal ultrasound who presents with persistent nausea, nonbloody nonbilious emesis over the past 2 weeks with associated constipation.  Patient symptoms seem most consistent with hyperemesis gravidarum especially with first  trimester pregnancy and previous history.  She did have some mild suprapubic tenderness and right lower quadrant tenderness on exam however no rebound or guarding.  She had transvaginal ultrasound obtained which showed no ectopic and single viable IUP at 7 weeks 0 days.  Appendix unable to be visualized but very low suspicion for appendicitis especially with no fevers normal white blood cell count 10.3, and generally benign abdominal exam with very minimal tenderness.  UA without any evidence of UTI no blood or RBCs concerning for nephrolithiasis.  No ketones present, no severe dehydration.  Her labs with mild hyponatremia 130 likely hypovolemic from fluid losses.  Creatinine 0.59 within normal limits.  Lipase 30 unremarkable no concern for pancreatitis.  No transaminitis present.  She was given IV fluids here with IV Reglan and Benadryl with improvement of nausea and vomiting.  She had no vomiting here.  She was able to keep down oral meds for constipation but declined enema at this time.  Will discharge with bowel regimen and Reglan per patient and patient's husband request.  She is already on doxylamine.  Discussed that unable to find appendix below suspicion at this time and return precautions.  She will follow-up with OB regarding visit to the ER today and discussion of bowel regimen and management of hyperemesis.  ED ADDENDUM  4:33 AM At time of discharge, patient complaining of nausea again still not feeling well. 1 episode nbnb emesis.  Will give another dose of IV Reglan.  If still feeling unwell will send to MAU. [VB]  5:42 AM Patient still not tolerating p.o. not wanting to try any p.o. meds.  Has gotten 2 doses of IV Reglan.  Had had rectal Phenergan at home and likely just prior to presentation.  Spoke to Dr. Charlotta Newton at [facility] who is excepted patient for evaluation at MAU for hyperemesis.  Patient prefers to be seen at MAU.  They will drive POV to MAU.  Amount and/or Complexity of Data  Reviewed Radiology: ordered.  Risk OTC drugs. Prescription drug management.      Final Clinical Impression(s) / ED Diagnoses Final diagnoses:  Nausea and vomiting in pregnancy  Constipation, unspecified constipation type    Rx / DC Orders ED Discharge Orders          Ordered    senna-docusate (SENOKOT-S) 8.6-50 MG tablet  Daily        01/04/23 0343    polyethylene glycol (MIRALAX) 17 g packet  2 times daily        01/04/23 0343    psyllium (HYDROCIL/METAMUCIL) 95 % PACK  Daily        01/04/23 0343  Doxylamine-Pyridoxine 10-10 MG TBEC  Nightly        01/04/23 0345    metoCLOPramide (REGLAN) 10 MG tablet  Every 6 hours PRN        01/04/23 0410              Mardene Sayer, MD 01/04/23 0414    Mardene Sayer, MD 01/04/23 [phone]

## 2023-01-04 NOTE — ED Notes (Signed)
Patient remains in Ultrasound at this time  

## 2023-01-04 NOTE — ED Notes (Signed)
PIV secured with gauze and coban prior to POV transportation.  Patient's husband will transport patient to MAU via POV.  Patient and husband informed to go straight to MAU.  Understanding voiced

## 2023-01-04 NOTE — ED Notes (Signed)
Report called to MAU.  Spoke to [employer].  All questions answered.

## 2023-01-04 NOTE — MAU Provider Note (Addendum)
Chief Complaint: Emesis and Constipation   Event Date/Time   First Provider Initiated Contact with Patient 01/04/23 0708        SUBJECTIVE HPI: Rebecca Benjamin is a 32 y.o. G5P1021 at [redacted]w[redacted]d by LMP who presents from ED via private car to maternity admissions reporting 2 week history of vomiting.  Has a history of hyperemesis.  Has been on Phenergan suppositories without relief.  Declined zofran due to constipation.. She denies vaginal bleeding, vaginal itching/burning, urinary symptoms, h/a, dizziness, or fever/chills.    Was given Reglan and Benadryl without relief. Was given laxatives and enema and did not find relief, though she did have a bowel movement at home yesterday  Emesis  This is a recurrent problem. There has been no fever. Pertinent negatives include no chills, diarrhea, dizziness or fever. Treatments tried: phenergan. The treatment provided no relief.  Constipation This is a recurrent problem. The problem is unchanged. There has Not been adequate water intake. Associated symptoms include vomiting. Pertinent negatives include no diarrhea or fever. Risk factors include recent dehydration (pregnancy).   RN Note: .Rebecca Benjamin is a 32 y.o. at [redacted]w[redacted]d here in MAU reporting: here from [facility] c/o N/V that started 2 weeks ago - has been taking nausea meds at home, but has not helped. N/V unrelieved by interventions [place]. Denies pain, VB, abnormal discharge, or vaginal irritation. Reports emesis x4 in the past 24 hours.   Past Medical History:  Diagnosis Date   Ectopic pregnancy    H/O cold sores    Headache    migraines    Past Surgical History:  Procedure Laterality Date   DILATION AND CURETTAGE OF UTERUS     DILATION AND EVACUATION N/A 10/26/2016   Procedure: DILATATION AND EVACUATION;  Surgeon: Jaymes Graff, MD;  Location: [facility];  Service: Gynecology;  Laterality: N/A;   FOOT SURGERY Left 2014 and 2015   LAPAROSCOPY Left 10/26/2016   Procedure:  LAPAROSCOPY OPERATIVE;  Surgeon: Jaymes Graff, MD;  Location: [facility];  Service: Gynecology;  Laterality: Left;  removal of ectopic pregnancy    TONSILLECTOMY     Social History   Socioeconomic History   Marital status: Married    Spouse name: Not on file   Number of children: 0   Years of education: 15   Highest education level: Not on file  Occupational History   Occupation: Land  Tobacco Use   Smoking status: Never   Smokeless tobacco: Never  Vaping Use   Vaping Use: Never used  Substance and Sexual Activity   Alcohol use: Not Currently    Comment: Socially   Drug use: No   Sexual activity: Not on file  Other Topics Concern   Not on file  Social History Narrative   Fun: Sherri Rad out with her fiance, shop   Denies religious beliefs effecting health care.    Feels safe at home and denies abuse.    Social Determinants of Health   Financial Resource Strain: Not on file  Food Insecurity: Not on file  Transportation Needs: Not on file  Physical Activity: Not on file  Stress: Not on file  Social Connections: Not on file  Intimate Partner Violence: Not on file   No current facility-administered medications on file prior to encounter.   Current Outpatient Medications on File Prior to Encounter  Medication Sig Dispense Refill   buPROPion (WELLBUTRIN) 75 MG tablet bupropion HCl 75 mg tablet  TAKE 1 TABLET BY MOUTH  TWICE DAILY     heparin 5000 UNIT/ML injection Inject 1 mL (5,000 Units total) into the skin 2 (two) times daily. 60 mL 6   [DISCONTINUED] iron polysaccharides (NIFEREX) 150 MG capsule Take 1 capsule (150 mg total) by mouth daily. (Patient not taking: Reported on 07/12/2018)     Allergies  Allergen Reactions   Amoxicillin Nausea And Vomiting    Has patient had a PCN reaction causing immediate rash, facial/tongue/throat swelling, SOB or lightheadedness with hypotension: no Has patient had a PCN reaction causing severe rash involving mucus membranes or  skin necrosis: no Has patient had a PCN reaction that required hospitalization no Has patient had a PCN reaction occurring within the last 10 years: no If all of the above answers are "NO", then may proceed with Cephalosporin use.    Imitrex [Sumatriptan]     C/o felt generalized flushing, burning, rhinorrhea within 2-3 min of medication   Latex Itching    I have reviewed patient's Past Medical Hx, Surgical Hx, Family Hx, Social Hx, medications and allergies.   ROS:  Review of Systems  Constitutional:  Negative for chills and fever.  Gastrointestinal:  Positive for constipation and vomiting. Negative for diarrhea.  Neurological:  Negative for dizziness.   Review of Systems  Other systems negative   Physical Exam  Physical Exam Patient Vitals for the past 24 hrs:  BP Temp Temp src Pulse Resp SpO2 Height Weight  01/04/23 0652 108/76 98.4 F (36.9 C) Oral 92 17 99 % 5\' 5"  (1.651 m) 67 kg  01/04/23 0559 -- 98.2 F (36.8 C) Oral -- -- -- -- --  01/04/23 0500 93/66 -- -- 91 16 97 % -- --  01/04/23 0044 110/76 -- -- 84 16 95 % -- --  01/03/23 2250 -- -- -- -- -- -- 5\' 5"  (1.651 m) 68 kg   Constitutional: Well-developed, well-nourished female in no acute distress.  Cardiovascular: normal rate Respiratory: normal effort GI: Abd soft, non-tender. MS: Extremities nontender, no edema, normal ROM Neurologic: Alert and oriented x 4.  GU: Neg CVAT.  LAB RESULTS Results for orders placed or performed during the hospital encounter of 01/03/23 (from the past 24 hour(s))  Comprehensive metabolic panel     Status: Abnormal   Collection Time: 01/03/23 11:09 PM  Result Value Ref Range   Sodium 130 (L) 135 - 145 mmol/L   Potassium 4.3 3.5 - 5.1 mmol/L   Chloride 99 98 - 111 mmol/L   CO2 21 (L) 22 - 32 mmol/L   Glucose, Bld 94 70 - 99 mg/dL   BUN 16 6 - 20 mg/dL   Creatinine, Ser 6.64 0.44 - 1.00 mg/dL   Calcium 9.5 8.9 - 40.3 mg/dL   Total Protein 8.1 6.5 - 8.1 g/dL   Albumin 4.2 3.5  - 5.0 g/dL   AST 17 15 - 41 U/L   ALT 11 0 - 44 U/L   Alkaline Phosphatase 38 38 - 126 U/L   Total Bilirubin 0.2 (L) 0.3 - 1.2 mg/dL   GFR, Estimated >47 >42 mL/min   Anion gap 10 5 - 15  Lipase, blood     Status: None   Collection Time: 01/03/23 11:09 PM  Result Value Ref Range   Lipase 30 11 - 51 U/L  CBC with Differential     Status: Abnormal   Collection Time: 01/03/23 11:09 PM  Result Value Ref Range   WBC 10.3 4.0 - 10.5 K/uL   RBC 4.38 3.87 - 5.11  MIL/uL   Hemoglobin 13.9 12.0 - 15.0 g/dL   HCT 16.1 09.6 - 04.5 %   MCV 90.4 80.0 - 100.0 fL   MCH 31.7 26.0 - 34.0 pg   MCHC 35.1 30.0 - 36.0 g/dL   RDW 40.9 81.1 - 91.4 %   Platelets 311 150 - 400 K/uL   nRBC 0.0 0.0 - 0.2 %   Neutrophils Relative % 78 %   Neutro Abs 8.0 (H) 1.7 - 7.7 K/uL   Lymphocytes Relative 16 %   Lymphs Abs 1.6 0.7 - 4.0 K/uL   Monocytes Relative 6 %   Monocytes Absolute 0.6 0.1 - 1.0 K/uL   Eosinophils Relative 0 %   Eosinophils Absolute 0.0 0.0 - 0.5 K/uL   Basophils Relative 0 %   Basophils Absolute 0.0 0.0 - 0.1 K/uL   Immature Granulocytes 0 %   Abs Immature Granulocytes 0.04 0.00 - 0.07 K/uL  Urinalysis, Routine w reflex microscopic -Urine, Clean Catch     Status: None   Collection Time: 01/03/23 11:10 PM  Result Value Ref Range   Color, Urine YELLOW YELLOW   APPearance CLEAR CLEAR   Specific Gravity, Urine 1.025 1.005 - 1.030   pH 6.0 5.0 - 8.0   Glucose, UA NEGATIVE NEGATIVE mg/dL   Hgb urine dipstick NEGATIVE NEGATIVE   Bilirubin Urine NEGATIVE NEGATIVE   Ketones, ur NEGATIVE NEGATIVE mg/dL   Protein, ur NEGATIVE NEGATIVE mg/dL   Nitrite NEGATIVE NEGATIVE   Leukocytes,Ua NEGATIVE NEGATIVE  Pregnancy, urine     Status: Abnormal   Collection Time: 01/03/23 11:10 PM  Result Value Ref Range   Preg Test, Ur POSITIVE (A) NEGATIVE       IMAGING US APPENDIX (ABDOMEN LIMITED)  Result Date: 01/04/2023 CLINICAL DATA:  Seven weeks pregnant with right lower quadrant pain. EXAM:  ULTRASOUND ABDOMEN LIMITED TECHNIQUE: Wallace Cullens scale imaging of the right lower quadrant was performed to evaluate for suspected appendicitis. Standard imaging planes and graded compression technique were utilized. COMPARISON:  None Available. FINDINGS: The appendix is not visualized. Ancillary findings: None. Factors affecting image quality: None. Other findings: None. IMPRESSION: Non visualization of the appendix. Non-visualization of appendix by [place] does not definitely exclude appendicitis. If there is sufficient clinical concern, consider abdomen pelvis MRI for further evaluation. Electronically Signed   By: Aram Candela M.D.   On: 01/04/2023 02:16   US OB LESS THAN 14 WEEKS WITH OB TRANSVAGINAL  Result Date: 01/04/2023 CLINICAL DATA:  Check viability. EXAM: OBSTETRIC <14 WK [place] AND TRANSVAGINAL OB US TECHNIQUE: Both transabdominal and transvaginal ultrasound examinations were performed for complete evaluation of the gestation as well as the maternal uterus, adnexal regions, and pelvic cul-de-sac. Transvaginal technique was performed to assess early pregnancy. COMPARISON:  None Available. FINDINGS: Intrauterine gestational sac: Single Yolk sac:  Visualized. Embryo:  Visualized. Cardiac Activity: Visualized. Heart Rate: 138 bpm CRL:  9.4 mm   7 w   0 d                  [place] [facility]: August 23, 2023 Subchorionic hemorrhage:  None visualized. Maternal uterus/adnexae: Multiple small heterogeneous uterine fibroids are seen. The largest measures approximately 1.7 cm x 1.0 cm x 1.2 cm. The bilateral ovaries are visualized and are normal in appearance. No pelvic free fluid is seen. IMPRESSION: Single, viable intrauterine pregnancy at approximately 7 weeks and 0 days gestation by ultrasound evaluation. Electronically Signed   By: Aram Candela M.D.   On: 01/04/2023 02:15  MAU Management/MDM: I have reviewed the triage vital signs and the nursing notes.   Pertinent labs & imaging results that were available  during my care of the patient were reviewed by me and considered in my medical decision making (see chart for details).      I have reviewed her medical records including past results, notes and treatments. Medical, Surgical, and family history were reviewed.  Medications and recent lab tests were reviewed  Consult Dr Charlotta Newton with presentation, exam findings, and results.  Will hydrate and give steroids for now Treatments in MAU included IV hydration, Phenergan (has not had any since yesterday), Solumedrol, and Scopolamine.   Discussed may send home on steroid taper..    ASSESSMENT 1. Nausea and vomiting in pregnancy   2. Constipation, unspecified constipation type     PLAN Care turned over to oncoming provider   Wynelle Bourgeois CNM, MSN Certified Nurse-Midwife 01/04/2023  7:08 AM     Patient tolerated crackers & feels better. Discussed management of symptoms at home. Will discontinue diclegis & phenergan. Patient to continue reglan on a schedule. Will also prescribe pepcid & scopolamine patch.  Vital signs, symptoms, & u/a reassuring. Would not continue with steroids at home at this point & will reserve for future use if needed.   Given SMOG enema in MAU with results & improvement in bloating symptoms. Discussed management of constipation at home.     1. Nausea and vomiting in pregnancy   2. Constipation, unspecified constipation type   3. [redacted] weeks gestation of pregnancy    -Rx pepcid & scop patch -Fill reglan previously prescribed -Reviewed reasons to return to MAU  Judeth Horn, NP

## 2023-01-04 NOTE — ED Notes (Signed)
Patient taken to Ultrasound at this time.

## 2023-01-05 NOTE — Progress Notes (Signed)
Pt reports good results after receiving enema.  States she feels better, less bloated.

## 2023-01-16 ENCOUNTER — Encounter (HOSPITAL_COMMUNITY): Payer: Self-pay | Admitting: Obstetrics and Gynecology

## 2023-01-16 ENCOUNTER — Inpatient Hospital Stay (HOSPITAL_COMMUNITY)
Admission: AD | Admit: 2023-01-16 | Discharge: 2023-01-17 | Disposition: A | Discharge status: Home / Self Care | Payer: No Typology Code available for payment source | Attending: Obstetrics and Gynecology | Admitting: Obstetrics and Gynecology

## 2023-01-16 DIAGNOSIS — Z79899 Other long term (current) drug therapy: Secondary | ICD-10-CM | POA: Diagnosis not present

## 2023-01-16 DIAGNOSIS — O09291 Supervision of pregnancy with other poor reproductive or obstetric history, first trimester: Secondary | ICD-10-CM | POA: Diagnosis not present

## 2023-01-16 DIAGNOSIS — O219 Vomiting of pregnancy, unspecified: Secondary | ICD-10-CM

## 2023-01-16 DIAGNOSIS — Z3A08 8 weeks gestation of pregnancy: Secondary | ICD-10-CM | POA: Diagnosis not present

## 2023-01-16 DIAGNOSIS — K5900 Constipation, unspecified: Secondary | ICD-10-CM | POA: Insufficient documentation

## 2023-01-16 DIAGNOSIS — O99611 Diseases of the digestive system complicating pregnancy, first trimester: Secondary | ICD-10-CM | POA: Insufficient documentation

## 2023-01-16 LAB — URINALYSIS, ROUTINE W REFLEX MICROSCOPIC
Bilirubin Urine: NEGATIVE
Glucose, UA: NEGATIVE mg/dL
Hgb urine dipstick: NEGATIVE
Ketones, ur: 5 mg/dL — AB
Leukocytes,Ua: NEGATIVE
Nitrite: NEGATIVE
Protein, ur: NEGATIVE mg/dL
Specific Gravity, Urine: 1.023 (ref 1.005–1.030)
pH: 7 (ref 5.0–8.0)

## 2023-01-16 MED ORDER — ONDANSETRON HCL 4 MG/2ML IJ SOLN
4.0000 mg | Freq: Once | INTRAMUSCULAR | Status: AC
  Administered 2023-01-16: 4 mg via INTRAVENOUS
  Filled 2023-01-16: qty 2

## 2023-01-16 MED ORDER — SODIUM CHLORIDE 0.9 % IV SOLN
25.0000 mg | Freq: Once | INTRAVENOUS | Status: AC
  Administered 2023-01-16: 25 mg via INTRAVENOUS
  Filled 2023-01-16: qty 1

## 2023-01-16 MED ORDER — SORBITOL 70 % SOLN
960.0000 mL | TOPICAL_OIL | Freq: Once | ORAL | Status: AC
  Administered 2023-01-16: 960 mL via RECTAL
  Filled 2023-01-16: qty 240

## 2023-01-16 MED ORDER — LACTATED RINGERS IV BOLUS
1000.0000 mL | Freq: Once | INTRAVENOUS | Status: AC
  Administered 2023-01-16: 1000 mL via INTRAVENOUS

## 2023-01-16 NOTE — MAU Note (Signed)
Rebecca Benjamin is a 32 y.o. at [redacted]w[redacted]d here in MAU reporting: hasn't used the bathroom, for a BM in like 3 days. Is extremely nauseous.  Is trying her best not to throw up.   No pain, just achy.  No bleeding.  Is on multiple meds for the vomiting, "even took citric Magnesium" and it didn't help.   Onset of complaint: ongoing problme Pain score: none Vitals:   01/16/23 1848  BP: 112/75  Pulse: 83  Resp: 17  Temp: 98.5 F (36.9 C)  SpO2: 99%      Lab orders placed from triage: UA   Was here a couple wks ago, received IV fluids and a soap suds enema

## 2023-01-16 NOTE — MAU Provider Note (Addendum)
History     CSN: [id number]  Arrival date and time: 01/16/23 1747   Event Date/Time   First Provider Initiated Contact with Patient 01/16/23 2037      Chief Complaint  Patient presents with   Constipation   Nausea   Rebecca Benjamin is a 32 y.o. [id number] at [redacted]w[redacted]d who presents today with nausea and vomiting. This has been an ongoing issue. She was here a couple of weeks ago and was given several medications. She has a scopolamine patch on, taking Reglan three times per day, pepcid daily. This was working well for her until a couple of days ago. Now the vomiting has returned. She is also constipated.   Constipation This is a new problem. The current episode started in the past 7 days. The problem is unchanged. The stool is described as pellet like. Associated symptoms include vomiting. Risk factors include change in medication usage/dosage, dietary change and recent dehydration.  Emesis  The current episode started more than 1 month ago. The problem occurs 5 to 10 times per day. The problem has been unchanged. The emesis has an appearance of stomach contents. There has been no fever. Risk factors: pregnancy.    OB History     Gravida  4   Para  1   Term  1   Preterm      AB  2   Living  1      SAB  1   IAB  0   Ectopic  1   Multiple      Live Births  1           Past Medical History:  Diagnosis Date   Ectopic pregnancy    H/O cold sores    Headache    migraines     Past Surgical History:  Procedure Laterality Date   DILATION AND CURETTAGE OF UTERUS     DILATION AND EVACUATION N/A 10/26/2016   Procedure: DILATATION AND EVACUATION;  Surgeon: Jaymes Graff, MD;  Location: [facility];  Service: Gynecology;  Laterality: N/A;   FOOT SURGERY Left 2014 and 2015   LAPAROSCOPY Left 10/26/2016   Procedure: LAPAROSCOPY OPERATIVE;  Surgeon: Jaymes Graff, MD;  Location: [facility];  Service: Gynecology;  Laterality: Left;  removal of ectopic pregnancy    TONSILLECTOMY       Family History  Problem Relation Age of Onset   Mitral valve prolapse Mother    Diabetes Mother    Depression Mother    Hypertension Mother    Healthy Father    Stroke Maternal Grandmother    Cancer Maternal Grandfather    Migraines Paternal Grandmother     Social History   Tobacco Use   Smoking status: Never   Smokeless tobacco: Never  Vaping Use   Vaping Use: Never used  Substance Use Topics   Alcohol use: Not Currently    Comment: Socially   Drug use: No    Allergies:  Allergies  Allergen Reactions   Amoxicillin Nausea And Vomiting    Has patient had a PCN reaction causing immediate rash, facial/tongue/throat swelling, SOB or lightheadedness with hypotension: no Has patient had a PCN reaction causing severe rash involving mucus membranes or skin necrosis: no Has patient had a PCN reaction that required hospitalization no Has patient had a PCN reaction occurring within the last 10 years: no If all of the above answers are "NO", then may proceed with Cephalosporin use.    Imitrex [Sumatriptan]  C/o felt generalized flushing, burning, rhinorrhea within 2-3 min of medication   Latex Itching    Medications Prior to Admission  Medication Sig Dispense Refill Last Dose   famotidine (PEPCID) 40 MG tablet Take 1 tablet (40 mg total) by mouth every evening. 30 tablet 0 01/16/2023   metoCLOPramide (REGLAN) 10 MG tablet Take 1 tablet (10 mg total) by mouth every 6 (six) hours as needed for nausea. 30 tablet 0 01/16/2023   scopolamine (TRANSDERM-SCOP) 1 MG/3DAYS Place 1 patch (1.5 mg total) onto the skin every 3 (three) days. 10 patch 1 01/16/2023   buPROPion (WELLBUTRIN) 75 MG tablet bupropion HCl 75 mg tablet  TAKE 1 TABLET BY MOUTH TWICE DAILY      heparin 5000 UNIT/ML injection Inject 1 mL (5,000 Units total) into the skin 2 (two) times daily. 60 mL 6    polyethylene glycol (MIRALAX) 17 g packet Take 17 g by mouth 2 (two) times daily. 60 packet 0    psyllium  (HYDROCIL/METAMUCIL) 95 % PACK Take 1 packet by mouth daily. 30 packet 0    senna-docusate (SENOKOT-S) 8.6-50 MG tablet Take 1 tablet by mouth daily. 30 tablet 0     Review of Systems  Gastrointestinal:  Positive for constipation and vomiting.  All other systems reviewed and are negative.  Physical Exam   Blood pressure 112/75, pulse 83, temperature 98.5 F (36.9 C), temperature source Oral, resp. rate 17, height 5\' 5"  (1.651 m), weight 68.1 kg, SpO2 99 %.  Physical Exam Vitals and nursing note reviewed.  Constitutional:      General: She is not in acute distress. HENT:     Head: Normocephalic.  Eyes:     Pupils: Pupils are equal, round, and reactive to light.  Cardiovascular:     Rate and Rhythm: Normal rate.  Pulmonary:     Effort: Pulmonary effort is normal.  Abdominal:     Palpations: Abdomen is soft.     Tenderness: There is no abdominal tenderness.  Musculoskeletal:        General: Normal range of motion.  Skin:    General: Skin is warm and dry.  Neurological:     Mental Status: She is alert and oriented to person, place, and time.  Psychiatric:        Mood and Affect: Mood normal.        Behavior: Behavior normal.   Results for orders placed or performed during the hospital encounter of 01/16/23 (from the past 24 hour(s))  Urinalysis, Routine w reflex microscopic -Urine, Clean Catch     Status: Abnormal   Collection Time: 01/16/23  7:12 PM  Result Value Ref Range   Color, Urine YELLOW YELLOW   APPearance HAZY (A) CLEAR   Specific Gravity, Urine 1.023 1.005 - 1.030   pH 7.0 5.0 - 8.0   Glucose, UA NEGATIVE NEGATIVE mg/dL   Hgb urine dipstick NEGATIVE NEGATIVE   Bilirubin Urine NEGATIVE NEGATIVE   Ketones, ur 5 (A) NEGATIVE mg/dL   Protein, ur NEGATIVE NEGATIVE mg/dL   Nitrite NEGATIVE NEGATIVE   Leukocytes,Ua NEGATIVE NEGATIVE    MAU Course  Procedures  MDM Patient getting 1L of LR, 4mg  zofran IV, 25mg  phenergan IV and SMOG enema.  She is waiting to  see how she is feeling and if she tolerates PO.  Care turned over to Wynelle Bourgeois, CNM   Thressa Sheller DNP, CNM  01/16/23  9:42 PM   Felt better after Enema and meds Able to tolerate PO intake  Discussed methods for constipation including diet and meds Wants to use Soap suds enema at home Discussed not to use more often than 3 days Discussed diet tips.  Assessment and Plan  Single IUP at [redacted]w[redacted]d Nausea and vomiting Constipation  Discharge home Refilled Reglan and Phenergan Followup in office Encouraged to return if she develops worsening of symptoms, increase in pain, fever, or other concerning symptoms.   Aviva Signs, CNM

## 2023-01-17 DIAGNOSIS — O219 Vomiting of pregnancy, unspecified: Secondary | ICD-10-CM | POA: Diagnosis not present

## 2023-01-17 DIAGNOSIS — K5900 Constipation, unspecified: Secondary | ICD-10-CM | POA: Diagnosis not present

## 2023-01-17 DIAGNOSIS — Z3A08 8 weeks gestation of pregnancy: Secondary | ICD-10-CM | POA: Diagnosis not present

## 2023-01-17 MED ORDER — METOCLOPRAMIDE HCL 10 MG PO TABS: 10.0000 mg | ORAL_TABLET | Freq: Four times a day (QID) | ORAL | 1 refills | Status: DC | PRN

## 2023-01-17 MED ORDER — PROMETHAZINE HCL 25 MG PO TABS: 25.0000 mg | ORAL_TABLET | Freq: Four times a day (QID) | ORAL | 2 refills | Status: DC | PRN

## 2023-01-31 ENCOUNTER — Encounter: Payer: Self-pay | Admitting: Family Medicine

## 2023-02-05 ENCOUNTER — Other Ambulatory Visit: Payer: Self-pay | Admitting: Advanced Practice Midwife

## 2023-02-13 ENCOUNTER — Encounter: Payer: Self-pay | Admitting: Family

## 2023-02-13 ENCOUNTER — Inpatient Hospital Stay: Payer: No Typology Code available for payment source | Attending: Hematology & Oncology

## 2023-02-13 ENCOUNTER — Inpatient Hospital Stay: Payer: No Typology Code available for payment source | Admitting: Family

## 2023-02-13 VITALS — BP 129/81 | HR 98 | Temp 98.4°F | Resp 17 | Wt 164.1 lb

## 2023-02-13 DIAGNOSIS — O26892 Other specified pregnancy related conditions, second trimester: Secondary | ICD-10-CM | POA: Insufficient documentation

## 2023-02-13 DIAGNOSIS — O99282 Endocrine, nutritional and metabolic diseases complicating pregnancy, second trimester: Secondary | ICD-10-CM | POA: Insufficient documentation

## 2023-02-13 DIAGNOSIS — E8802 Plasminogen deficiency: Secondary | ICD-10-CM | POA: Diagnosis not present

## 2023-02-13 DIAGNOSIS — Z7901 Long term (current) use of anticoagulants: Secondary | ICD-10-CM

## 2023-02-13 DIAGNOSIS — Z3A14 14 weeks gestation of pregnancy: Secondary | ICD-10-CM | POA: Insufficient documentation

## 2023-02-13 DIAGNOSIS — Z1589 Genetic susceptibility to other disease: Secondary | ICD-10-CM

## 2023-02-13 DIAGNOSIS — N96 Recurrent pregnancy loss: Secondary | ICD-10-CM

## 2023-02-13 MED ORDER — ENOXAPARIN SODIUM 80 MG/0.8ML IJ SOSY
80.0000 mg | PREFILLED_SYRINGE | INTRAMUSCULAR | 3 refills | Status: DC
Start: 2023-02-13 — End: 2023-04-02

## 2023-02-13 NOTE — Progress Notes (Signed)
Hematology/Oncology Consultation   Name: Rebecca Benjamin      MRN: [id number]    Location: Room/bed info not found  Date: 02/13/2023 Time:4:09 PM   REFERRING PHYSICIAN:  Maxie Better, MD  REASON FOR CONSULT:  Plasminogen deficiency    DIAGNOSIS:  PAI-1 4G/5G History of multiple miscarriages Currently [redacted] weeks pregnant, due 08/21/2023  HISTORY OF PRESENT ILLNESS:  Rebecca Benjamin is a pleasant 32 yo African American female with history 4 miscarriages.  She has a 38 yo son and is currently [redacted] weeks pregnant. She is due on 08/21/2023.  She had been seen by [facility] Rebecca Benjamin for the history of miscarriage and was on heparin until her 8th week of pregnancy. Once she transitioned back to her OB/Gyn (Rebecca Benjamin) she was tested for several hyper coagulable conditions. 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  No personal history of DVT/PE/thrombotic event.  Her maternal grandmother had 5 strokes. Paternal great grandmother and great grandfather had history of stroke as well.  She has fatigue, dizziness and palpitations occasionally.  Her has significant nausea with pregnancy but this seems to be controlled well now with phenergan, reglan and scopolamine patch.  No fever, chills, cough, rash, SOB, chest pain, abdominal pain or changes in bowel habits.  She has history of chornic constipation and takes a stool softener and uses soap sud enemas to help have a BM.  She notes that her bladder feels achy some mornings when she gets up and has to go. She denies any symptoms of UTI.  No history of diabetes or thyroid disease.  No personal history of cancer. Her maternal grandfather had lung and paternal grandfather had prostate.  Occasional swelling in the left foot that comes and goes.  No numbness or tingling in her extremities.  No falls or syncope reported.  No smoking, ETOH or recreational drug use.  Appetite and hydration are good at this time.  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   ROS: All other 10 point review of systems is negative.   PAST MEDICAL HISTORY:   Past Medical History:  Diagnosis Date   Ectopic pregnancy    H/O cold sores    Headache    migraines     ALLERGIES: Allergies  Allergen Reactions   Amoxicillin Nausea And Vomiting    Has patient had a PCN reaction causing immediate rash, facial/tongue/throat swelling, SOB or lightheadedness with hypotension: no Has patient had a PCN reaction causing severe rash involving mucus membranes or skin necrosis: no Has patient had a PCN reaction that required hospitalization no Has patient had a PCN reaction occurring within the last 10 years: no If all of the above answers are "NO", then may proceed with Cephalosporin use.    Imitrex [Sumatriptan]     C/o felt generalized flushing, burning, rhinorrhea within 2-3 min of medication   Latex Itching      MEDICATIONS:  Current Outpatient Medications on File Prior to Visit  Medication Sig Dispense Refill   famotidine (PEPCID) 40 MG tablet Take 1 tablet (40 mg total) by mouth every evening. 30 tablet 0   metoCLOPramide (REGLAN) 10 MG tablet Take 1 tablet (10 mg total) by mouth every 6 (six) hours as needed for nausea. 30 tablet 1   promethazine (PHENERGAN) 25 MG tablet Take 1 tablet (25 mg total) by mouth every 6 (six) hours as needed for nausea or vomiting. 30 tablet 2   scopolamine (  TRANSDERM-SCOP) 1 MG/3DAYS Place 1 patch (1.5 mg total) onto the skin every 3 (three) days. 10 patch 1   senna-docusate (SENOKOT-S) 8.6-50 MG tablet Take 1 tablet by mouth daily. 30 tablet 0   buPROPion (WELLBUTRIN) 75 MG tablet bupropion HCl 75 mg tablet  TAKE 1 TABLET BY MOUTH TWICE DAILY (Patient not taking: Reported on 02/13/2023)     metoCLOPramide (REGLAN) 10 MG tablet Take 1 tablet (10 mg total) by mouth every 6 (six) hours as needed for nausea. (Patient not taking: Reported on 02/13/2023) 30 tablet 0   [DISCONTINUED] iron  polysaccharides (NIFEREX) 150 MG capsule Take 1 capsule (150 mg total) by mouth daily. (Patient not taking: Reported on 07/12/2018)     No current facility-administered medications on file prior to visit.     PAST SURGICAL HISTORY Past Surgical History:  Procedure Laterality Date   DILATION AND CURETTAGE OF UTERUS     DILATION AND EVACUATION N/A 10/26/2016   Procedure: DILATATION AND EVACUATION;  Surgeon: Jaymes Graff, MD;  Location: [facility];  Service: Gynecology;  Laterality: N/A;   FOOT SURGERY Left 2014 and 2015   LAPAROSCOPY Left 10/26/2016   Procedure: LAPAROSCOPY OPERATIVE;  Surgeon: Jaymes Graff, MD;  Location: [facility];  Service: Gynecology;  Laterality: Left;  removal of ectopic pregnancy    TONSILLECTOMY      FAMILY HISTORY: Family History  Problem Relation Age of Onset   Mitral valve prolapse Mother    Diabetes Mother    Depression Mother    Hypertension Mother    Healthy Father    Stroke Maternal Grandmother    Cancer Maternal Grandfather    Migraines Paternal Grandmother     SOCIAL HISTORY:  reports that she has never smoked. She has never used smokeless tobacco. She reports that she does not currently use alcohol. She reports that she does not use drugs.  PERFORMANCE STATUS: The patient's performance status is 1 - Symptomatic but completely ambulatory  PHYSICAL EXAM: Most Recent Vital Signs: Blood pressure 129/81, pulse 98, temperature 98.4 F (36.9 C), temperature source Oral, resp. rate 17, weight 164 lb 1.3 oz (74.4 kg), SpO2 99 %. BP 129/81 (BP Location: Left Arm, Patient Position: Sitting)   Pulse 98   Temp 98.4 F (36.9 C) (Oral)   Resp 17   Wt 164 lb 1.3 oz (74.4 kg)   SpO2 99%   BMI 27.30 kg/m   General Appearance:    Alert, cooperative, no distress, appears stated age  Head:    Normocephalic, without obvious abnormality, atraumatic  Eyes:    PERRL, conjunctiva/corneas clear, EOM's intact, fundi    benign, both eyes        Throat:   Lips,  mucosa, and tongue normal; teeth and gums normal  Neck:   Supple, symmetrical, trachea midline, no adenopathy;    thyroid:  no enlargement/tenderness/nodules; no carotid   bruit or JVD  Back:     Symmetric, no curvature, ROM normal, no CVA tenderness  Lungs:     Clear to auscultation bilaterally, respirations unlabored  Chest Wall:    No tenderness or deformity   Heart:    Regular rate and rhythm, S1 and S2 normal, no murmur, rub   or gallop     Abdomen:     Soft, non-tender, bowel sounds active all four quadrants,    no masses, no organomegaly        Extremities:   Extremities normal, atraumatic, no cyanosis or edema  Pulses:   2+ and  symmetric all extremities  Skin:   Skin color, texture, turgor normal, no rashes or lesions  Lymph nodes:   Cervical, supraclavicular, and axillary nodes normal  Neurologic:   CNII-XII intact, normal strength, sensation and reflexes    throughout    LABORATORY DATA:  No results found for this or any previous visit (from the past 48 hour(s)).    RADIOGRAPHY: No results found.     PATHOLOGY: None  ASSESSMENT/PLAN: Ms. Monclova is a pleasant 32 yo African American female with history 4 miscarriages positive for the PAI-1 4G/5G polymorphism. She is now [redacted] weeks pregnant.  Patient discussed at length with Dr. Myna Hidalgo. We will have her start Lovenox 80 mg SQ daily prophylaxis.  Patient will return to office for injection education this week once she picks up her script.  Follow-up in 1 month.   All questions were answered. The patient knows to call the clinic with any problems, questions or concerns. We can certainly see the patient much sooner if necessary.  The patient was discussed with Dr. Myna Hidalgo and he is in agreement with the aforementioned.   Eileen Stanford, NP

## 2023-03-11 ENCOUNTER — Other Ambulatory Visit: Payer: Self-pay | Admitting: Obstetrics and Gynecology

## 2023-03-11 DIAGNOSIS — N96 Recurrent pregnancy loss: Secondary | ICD-10-CM

## 2023-03-15 ENCOUNTER — Encounter: Payer: Self-pay | Admitting: Hematology & Oncology

## 2023-03-15 ENCOUNTER — Inpatient Hospital Stay (HOSPITAL_BASED_OUTPATIENT_CLINIC_OR_DEPARTMENT_OTHER): Payer: No Typology Code available for payment source | Admitting: Hematology & Oncology

## 2023-03-15 ENCOUNTER — Other Ambulatory Visit: Payer: Self-pay

## 2023-03-15 ENCOUNTER — Inpatient Hospital Stay: Payer: No Typology Code available for payment source | Attending: Family

## 2023-03-15 VITALS — BP 121/74 | HR 89 | Temp 98.7°F | Resp 16 | Wt 170.0 lb

## 2023-03-15 DIAGNOSIS — E8802 Plasminogen deficiency: Secondary | ICD-10-CM | POA: Diagnosis not present

## 2023-03-15 DIAGNOSIS — N96 Recurrent pregnancy loss: Secondary | ICD-10-CM | POA: Diagnosis not present

## 2023-03-15 DIAGNOSIS — O26892 Other specified pregnancy related conditions, second trimester: Secondary | ICD-10-CM | POA: Diagnosis present

## 2023-03-15 DIAGNOSIS — Z3A17 17 weeks gestation of pregnancy: Secondary | ICD-10-CM

## 2023-03-15 DIAGNOSIS — Z7901 Long term (current) use of anticoagulants: Secondary | ICD-10-CM | POA: Insufficient documentation

## 2023-03-15 DIAGNOSIS — O99282 Endocrine, nutritional and metabolic diseases complicating pregnancy, second trimester: Secondary | ICD-10-CM | POA: Diagnosis not present

## 2023-03-15 DIAGNOSIS — O2622 Pregnancy care for patient with recurrent pregnancy loss, second trimester: Secondary | ICD-10-CM | POA: Diagnosis not present

## 2023-03-15 DIAGNOSIS — Z1589 Genetic susceptibility to other disease: Secondary | ICD-10-CM

## 2023-03-15 LAB — CBC WITH DIFFERENTIAL (CANCER CENTER ONLY)
Abs Immature Granulocytes: 0.13 10*3/uL — ABNORMAL HIGH (ref 0.00–0.07)
Basophils Absolute: 0 10*3/uL (ref 0.0–0.1)
Basophils Relative: 0 %
Eosinophils Absolute: 0.1 10*3/uL (ref 0.0–0.5)
Eosinophils Relative: 1 %
HCT: 33.7 % — ABNORMAL LOW (ref 36.0–46.0)
Hemoglobin: 11.2 g/dL — ABNORMAL LOW (ref 12.0–15.0)
Immature Granulocytes: 1 %
Lymphocytes Relative: 16 %
Lymphs Abs: 1.9 10*3/uL (ref 0.7–4.0)
MCH: 30.9 pg (ref 26.0–34.0)
MCHC: 33.2 g/dL (ref 30.0–36.0)
MCV: 92.8 fL (ref 80.0–100.0)
Monocytes Absolute: 0.7 10*3/uL (ref 0.1–1.0)
Monocytes Relative: 6 %
Neutro Abs: 9 10*3/uL — ABNORMAL HIGH (ref 1.7–7.7)
Neutrophils Relative %: 76 %
Platelet Count: 257 10*3/uL (ref 150–400)
RBC: 3.63 MIL/uL — ABNORMAL LOW (ref 3.87–5.11)
RDW: 13.2 % (ref 11.5–15.5)
WBC Count: 11.8 10*3/uL — ABNORMAL HIGH (ref 4.0–10.5)
nRBC: 0 % (ref 0.0–0.2)

## 2023-03-15 LAB — CMP (CANCER CENTER ONLY)
ALT: 10 U/L (ref 0–44)
AST: 12 U/L — ABNORMAL LOW (ref 15–41)
Albumin: 3.8 g/dL (ref 3.5–5.0)
Alkaline Phosphatase: 37 U/L — ABNORMAL LOW (ref 38–126)
Anion gap: 7 (ref 5–15)
BUN: 9 mg/dL (ref 6–20)
CO2: 24 mmol/L (ref 22–32)
Calcium: 9.3 mg/dL (ref 8.9–10.3)
Chloride: 103 mmol/L (ref 98–111)
Creatinine: 0.56 mg/dL (ref 0.44–1.00)
GFR, Estimated: >60 mL/min (ref >60)
Glucose, Bld: 89 mg/dL (ref 70–99)
Potassium: 3.7 mmol/L (ref 3.5–5.1)
Sodium: 134 mmol/L — ABNORMAL LOW (ref 135–145)
Total Bilirubin: 0.3 mg/dL (ref 0.3–1.2)
Total Protein: 6.9 g/dL (ref 6.5–8.1)

## 2023-03-15 LAB — LACTATE DEHYDROGENASE: LDH: 129 U/L (ref 98–192)

## 2023-03-15 NOTE — Progress Notes (Signed)
Hematology and Oncology Follow Up Visit  Rebecca Benjamin [id number] 1990/11/09 32 y.o. 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  This is her second office visit.  She first was seen on 02/13/2023.  She has had recurrent miscarriages.  She does have a 63-year-old son.  She currently is [redacted] weeks pregnant.  So far, the pregnancy is going okay.  She is on Lovenox.  She is doing well on Lovenox.  She has had no bleeding on Lovenox.  She has had no cough or shortness of breath.  There is been no leg swelling.  Her due date I think is December 11.  She has had no headache.  Overall, I would say performance status is probably ECOG 0.  Medications:  Current Outpatient Medications:    folic acid (FOLVITE) 1 MG tablet, Take 4 mg by mouth daily., Disp: , Rfl:    enoxaparin (LOVENOX) 80 MG/0.8ML injection, Inject 0.8 mLs (80 mg total) into the skin daily., Disp: 30 mL, Rfl: 3   famotidine (PEPCID) 40 MG tablet, Take 1 tablet (40 mg total) by mouth every evening., Disp: 30 tablet, Rfl: 0   metoCLOPramide (REGLAN) 10 MG tablet, Take 1 tablet (10 mg total) by mouth every 6 (six) hours as needed for nausea. (Patient not taking: Reported on 02/13/2023), Disp: 30 tablet, Rfl: 0   Pediatric Multivit-Minerals (FLINTSTONES GUMMIES) chewable tablet, Chew 1 tablet by mouth daily., Disp: , Rfl:    pyridoxine (B-6) 100 MG tablet, Take 100 mg by mouth daily., Disp: , Rfl:    scopolamine (TRANSDERM-SCOP) 1 MG/3DAYS, Place 1 patch (1.5 mg total) onto the skin every 3 (three) days., Disp: 10 patch, Rfl: 1  Allergies:  Allergies  Allergen Reactions   Amoxicillin Nausea And Vomiting    Has patient had a PCN reaction causing immediate rash, facial/tongue/throat swelling, SOB or lightheadedness with hypotension: no  Has patient had a PCN reaction causing severe rash involving  mucus membranes or skin necrosis: no  Has patient had a PCN reaction that required hospitalization no  Has patient had a PCN reaction occurring within the last 10 years: no  If all of the above answers are "NO", then may proceed with Cephalosporin use.   Imitrex [Sumatriptan]     C/o felt generalized flushing, burning, rhinorrhea within 2-3 min of medication   Latex Itching    Past Medical History, Surgical history, Social history, and Family History were reviewed and updated.  Review of Systems: Review of Systems  Constitutional: Negative.   HENT:  Negative.    Eyes: Negative.   Respiratory: Negative.    Cardiovascular: Negative.   Gastrointestinal: Negative.   Endocrine: Negative.   Genitourinary: Negative.    Musculoskeletal: Negative.   Skin: Negative.   Neurological: Negative.   Hematological: Negative.   Psychiatric/Behavioral: Negative.      Physical Exam:  weight is 170 lb (77.1 kg). Her oral temperature is 98.7 F (37.1 C). Her blood pressure is 121/74 and her pulse is 89. Her respiration is 16 and oxygen saturation is 98%.   Wt Readings from Last 3 Encounters:  03/15/23 170 lb (77.1 kg)  02/13/23 164 lb 1.3 oz (74.4 kg)  01/16/23 150 lb 1.6 oz (68.1 kg)    Physical Exam Vitals reviewed.  HENT:     Head: Normocephalic and atraumatic.  Eyes:  Pupils: Pupils are equal, round, and reactive to light.  Cardiovascular:     Rate and Rhythm: Normal rate and regular rhythm.     Heart sounds: Normal heart sounds.  Pulmonary:     Effort: Pulmonary effort is normal.     Breath sounds: Normal breath sounds.  Abdominal:     General: Bowel sounds are normal.     Palpations: Abdomen is soft.     Comments: Her abdomen is soft.  She is pregnant.  She has no obvious fluid wave.  There is no palpable liver or spleen tip.  Musculoskeletal:        General: No tenderness or deformity. Normal range of motion.     Cervical back: Normal range of motion.     Comments:  Extremities shows no clubbing, cyanosis or edema.  Lymphadenopathy:     Cervical: No cervical adenopathy.  Skin:    General: Skin is warm and dry.     Findings: No erythema or rash.  Neurological:     Mental Status: She is alert and oriented to person, place, and time.  Psychiatric:        Behavior: Behavior normal.        Thought Content: Thought content normal.        Judgment: Judgment normal.      Lab Results  Component Value Date   WBC 11.8 (H) 03/15/2023   HGB 11.2 (L) 03/15/2023   HCT 33.7 (L) 03/15/2023   MCV 92.8 03/15/2023   PLT 257 03/15/2023     Chemistry      Component Value Date/Time   NA 134 (L) 03/15/2023 1314   K 3.7 03/15/2023 1314   CL 103 03/15/2023 1314   CO2 24 03/15/2023 1314   BUN 9 03/15/2023 1314   CREATININE 0.56 03/15/2023 1314      Component Value Date/Time   CALCIUM 9.3 03/15/2023 1314   ALKPHOS 37 (L) 03/15/2023 1314   AST 12 (L) 03/15/2023 1314   ALT 10 03/15/2023 1314   BILITOT 0.3 03/15/2023 1314      Impression and Plan: Rebecca Benjamin is a very nice 32 year old African-American female.  She comes in with her husband and 32-year-old son.  Both she and her husband were in the [employer].  As such, they are true American hero's.  We really have to do our best to make sure that she has this child.  She is on Lovenox right now.  We will keep her on Lovenox until about a month before her delivery and then we can switch over to heparin.  Hopefully, she will be able to have this child.  She is doing well so far.  I will like to think that the Lovenox will help get her through pregnancy.  I like to have her come back the end of August.  At that point, we will see how she is doing.     Josph Macho, MD 7/5/20241:48 PM

## 2023-04-02 ENCOUNTER — Other Ambulatory Visit: Payer: Self-pay

## 2023-04-02 DIAGNOSIS — Z1589 Genetic susceptibility to other disease: Secondary | ICD-10-CM

## 2023-04-02 DIAGNOSIS — N96 Recurrent pregnancy loss: Secondary | ICD-10-CM

## 2023-04-02 MED ORDER — ENOXAPARIN SODIUM 80 MG/0.8ML IJ SOSY
80.0000 mg | PREFILLED_SYRINGE | INTRAMUSCULAR | 3 refills | Status: DC
Start: 2023-04-02 — End: 2023-09-25

## 2023-04-03 ENCOUNTER — Encounter: Payer: Self-pay | Admitting: *Deleted

## 2023-04-08 ENCOUNTER — Encounter: Payer: Self-pay | Admitting: *Deleted

## 2023-04-08 ENCOUNTER — Ambulatory Visit: Payer: No Typology Code available for payment source | Admitting: *Deleted

## 2023-04-08 ENCOUNTER — Ambulatory Visit: Payer: No Typology Code available for payment source | Attending: Obstetrics and Gynecology

## 2023-04-08 DIAGNOSIS — O99282 Endocrine, nutritional and metabolic diseases complicating pregnancy, second trimester: Secondary | ICD-10-CM

## 2023-04-08 DIAGNOSIS — E8802 Plasminogen deficiency: Secondary | ICD-10-CM

## 2023-04-08 DIAGNOSIS — N96 Recurrent pregnancy loss: Secondary | ICD-10-CM | POA: Diagnosis present

## 2023-04-08 DIAGNOSIS — O3412 Maternal care for benign tumor of corpus uteri, second trimester: Secondary | ICD-10-CM | POA: Diagnosis not present

## 2023-04-08 DIAGNOSIS — O285 Abnormal chromosomal and genetic finding on antenatal screening of mother: Secondary | ICD-10-CM

## 2023-04-08 DIAGNOSIS — D259 Leiomyoma of uterus, unspecified: Secondary | ICD-10-CM

## 2023-04-08 DIAGNOSIS — Z3A20 20 weeks gestation of pregnancy: Secondary | ICD-10-CM

## 2023-04-08 DIAGNOSIS — Z148 Genetic carrier of other disease: Secondary | ICD-10-CM

## 2023-04-09 ENCOUNTER — Other Ambulatory Visit: Payer: Self-pay | Admitting: *Deleted

## 2023-04-09 DIAGNOSIS — D259 Leiomyoma of uterus, unspecified: Secondary | ICD-10-CM

## 2023-05-06 ENCOUNTER — Encounter: Payer: Self-pay | Admitting: Hematology & Oncology

## 2023-05-06 ENCOUNTER — Inpatient Hospital Stay (HOSPITAL_BASED_OUTPATIENT_CLINIC_OR_DEPARTMENT_OTHER): Payer: No Typology Code available for payment source | Admitting: Hematology & Oncology

## 2023-05-06 ENCOUNTER — Inpatient Hospital Stay: Payer: No Typology Code available for payment source | Attending: Family

## 2023-05-06 VITALS — BP 115/67 | HR 96 | Temp 98.8°F | Resp 20 | Ht 66.0 in | Wt 173.0 lb

## 2023-05-06 DIAGNOSIS — E8802 Plasminogen deficiency: Secondary | ICD-10-CM | POA: Insufficient documentation

## 2023-05-06 DIAGNOSIS — Z3A00 Weeks of gestation of pregnancy not specified: Secondary | ICD-10-CM | POA: Insufficient documentation

## 2023-05-06 DIAGNOSIS — O26892 Other specified pregnancy related conditions, second trimester: Secondary | ICD-10-CM | POA: Diagnosis present

## 2023-05-06 DIAGNOSIS — O0000 Abdominal pregnancy without intrauterine pregnancy: Secondary | ICD-10-CM | POA: Diagnosis not present

## 2023-05-06 DIAGNOSIS — O2622 Pregnancy care for patient with recurrent pregnancy loss, second trimester: Secondary | ICD-10-CM

## 2023-05-06 LAB — CBC WITH DIFFERENTIAL (CANCER CENTER ONLY)
Abs Immature Granulocytes: 0.04 10*3/uL (ref 0.00–0.07)
Basophils Absolute: 0 10*3/uL (ref 0.0–0.1)
Basophils Relative: 0 %
Eosinophils Absolute: 0 10*3/uL (ref 0.0–0.5)
Eosinophils Relative: 0 %
HCT: 29.4 % — ABNORMAL LOW (ref 36.0–46.0)
Hemoglobin: 9.6 g/dL — ABNORMAL LOW (ref 12.0–15.0)
Immature Granulocytes: 1 %
Lymphocytes Relative: 16 %
Lymphs Abs: 1.3 10*3/uL (ref 0.7–4.0)
MCH: 29.9 pg (ref 26.0–34.0)
MCHC: 32.7 g/dL (ref 30.0–36.0)
MCV: 91.6 fL (ref 80.0–100.0)
Monocytes Absolute: 0.5 10*3/uL (ref 0.1–1.0)
Monocytes Relative: 7 %
Neutro Abs: 5.8 10*3/uL (ref 1.7–7.7)
Neutrophils Relative %: 76 %
Platelet Count: 228 10*3/uL (ref 150–400)
RBC: 3.21 MIL/uL — ABNORMAL LOW (ref 3.87–5.11)
RDW: 12.8 % (ref 11.5–15.5)
WBC Count: 7.7 10*3/uL (ref 4.0–10.5)
nRBC: 0 % (ref 0.0–0.2)

## 2023-05-06 LAB — CMP (CANCER CENTER ONLY)
ALT: 8 U/L (ref 0–44)
AST: 11 U/L — ABNORMAL LOW (ref 15–41)
Albumin: 3.6 g/dL (ref 3.5–5.0)
Alkaline Phosphatase: 43 U/L (ref 38–126)
Anion gap: 9 (ref 5–15)
BUN: 10 mg/dL (ref 6–20)
CO2: 23 mmol/L (ref 22–32)
Calcium: 8.6 mg/dL — ABNORMAL LOW (ref 8.9–10.3)
Chloride: 104 mmol/L (ref 98–111)
Creatinine: 0.58 mg/dL (ref 0.44–1.00)
GFR, Estimated: >60 mL/min (ref >60)
Glucose, Bld: 81 mg/dL (ref 70–99)
Potassium: 3.3 mmol/L — ABNORMAL LOW (ref 3.5–5.1)
Sodium: 136 mmol/L (ref 135–145)
Total Bilirubin: 0.3 mg/dL (ref 0.3–1.2)
Total Protein: 6.2 g/dL — ABNORMAL LOW (ref 6.5–8.1)

## 2023-05-06 LAB — LACTATE DEHYDROGENASE: LDH: 137 U/L (ref 98–192)

## 2023-05-06 NOTE — Progress Notes (Signed)
Hematology and Oncology Follow Up Visit  SHAIA TASSONE [id number] [birth year] 32 y.o. 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 Ercolani is back for follow-up.  So far, she is doing pretty well with the Lovenox.  I think that her due date is going to be December 11.  It sounds like she did not be induced about a week beforehand.  She has had no problems with the Lovenox.  There is no bleeding.  She has had no problems with the pregnancy itself.  Her baby is doing quite well.  It is a baby girl.  She has had no problems with rashes.  She has occasional leg swelling.  There is no cough or shortness of breath.  She has had no bleeding.  She has had no fever.  She has had no headache.  I told her that typically about a month before she is due, we will switch over to heparin.  This way, heparin, would be short acting, we will be able to get out of her system in case she goes into labor early or if there is any complications that she needs to be delivered quickly.  Currently, I would have to say that her performance status is probably ECOG 0.    Medications:  Current Outpatient Medications:    docusate sodium (COLACE) 100 MG capsule, Take by mouth 4 (four) times daily., Disp: , Rfl:    enoxaparin (LOVENOX) 80 MG/0.8ML injection, Inject 0.8 mLs (80 mg total) into the skin daily., Disp: 30 mL, Rfl: 3   Prenatal MV & Min w/FA-DHA (PRENATAL GUMMIES PO), Take by mouth daily., Disp: , Rfl:    Psyllium (METAMUCIL) 48.57 % POWD, 1 tablespoon Orally twice a day, Disp: , Rfl:    folic acid (FOLVITE) 1 MG tablet, Take 4 mg by mouth daily. (Patient not taking: Reported on 05/06/2023), Disp: , Rfl:    halobetasol (ULTRAVATE) 0.05 % ointment, Apply topically 2 (two) times daily as needed. (Patient not taking: Reported on 05/06/2023), Disp: , Rfl:    metoCLOPramide (REGLAN) 10 MG tablet, Take 1  tablet (10 mg total) by mouth every 6 (six) hours as needed for nausea. (Patient not taking: Reported on 02/13/2023), Disp: 30 tablet, Rfl: 0   scopolamine (TRANSDERM-SCOP) 1 MG/3DAYS, Place 1 patch (1.5 mg total) onto the skin every 3 (three) days. (Patient not taking: Reported on 05/06/2023), Disp: 10 patch, Rfl: 1  Allergies:  Allergies  Allergen Reactions   Amoxicillin Nausea And Vomiting    Has patient had a PCN reaction causing immediate rash, facial/tongue/throat swelling, SOB or lightheadedness with hypotension: no  Has patient had a PCN reaction causing severe rash involving mucus membranes or skin necrosis: no  Has patient had a PCN reaction that required hospitalization no  Has patient had a PCN reaction occurring within the last 10 years: no  If all of the above answers are "NO", then may proceed with Cephalosporin use.   Imitrex [Sumatriptan]     C/o felt generalized flushing, burning, rhinorrhea within 2-3 min of medication   Latex Itching    Past Medical History, Surgical history, Social history, and Family History were reviewed and updated.  Review of Systems: Review of Systems  Constitutional: Negative.   HENT:  Negative.    Eyes: Negative.   Respiratory: Negative.    Cardiovascular: Negative.   Gastrointestinal: Negative.  Endocrine: Negative.   Genitourinary: Negative.    Musculoskeletal: Negative.   Skin: Negative.   Neurological: Negative.   Hematological: Negative.   Psychiatric/Behavioral: Negative.      Physical Exam:  height is 5\' 6"  (1.676 m) and weight is 173 lb (78.5 kg). Her oral temperature is 98.8 F (37.1 C). Her blood pressure is 115/67 and her pulse is 96. Her respiration is 20 and oxygen saturation is 100%.   Wt Readings from Last 3 Encounters:  05/06/23 173 lb (78.5 kg)  03/15/23 170 lb (77.1 kg)  02/13/23 164 lb 1.3 oz (74.4 kg)    Physical Exam Vitals reviewed.  HENT:     Head: Normocephalic and atraumatic.  Eyes:     Pupils:  Pupils are equal, round, and reactive to light.  Cardiovascular:     Rate and Rhythm: Normal rate and regular rhythm.     Heart sounds: Normal heart sounds.  Pulmonary:     Effort: Pulmonary effort is normal.     Breath sounds: Normal breath sounds.  Abdominal:     General: Bowel sounds are normal.     Palpations: Abdomen is soft.     Comments: Her abdomen is soft.  She is pregnant.  She has no obvious fluid wave.  There is no palpable liver or spleen tip.  Musculoskeletal:        General: No tenderness or deformity. Normal range of motion.     Cervical back: Normal range of motion.     Comments: Extremities shows no clubbing, cyanosis or edema.  Lymphadenopathy:     Cervical: No cervical adenopathy.  Skin:    General: Skin is warm and dry.     Findings: No erythema or rash.  Neurological:     Mental Status: She is alert and oriented to person, place, and time.  Psychiatric:        Behavior: Behavior normal.        Thought Content: Thought content normal.        Judgment: Judgment normal.     Lab Results  Component Value Date   WBC 11.8 (H) 03/15/2023   HGB 11.2 (L) 03/15/2023   HCT 33.7 (L) 03/15/2023   MCV 92.8 03/15/2023   PLT 257 03/15/2023     Chemistry      Component Value Date/Time   NA 134 (L) 03/15/2023 1314   K 3.7 03/15/2023 1314   CL 103 03/15/2023 1314   CO2 24 03/15/2023 1314   BUN 9 03/15/2023 1314   CREATININE 0.56 03/15/2023 1314      Component Value Date/Time   CALCIUM 9.3 03/15/2023 1314   ALKPHOS 37 (L) 03/15/2023 1314   AST 12 (L) 03/15/2023 1314   ALT 10 03/15/2023 1314   BILITOT 0.3 03/15/2023 1314      Impression and Plan: Ms. Hollender is a very nice 32 year old African-American female.  She comes in with her husband and 17-year-old son.  Both she and her husband were in the [employer].  As such, they are true American hero's.  We really have to do our best to make sure that she has this child.  She is on Lovenox right now.  We will keep  her on Lovenox until about a month before her delivery and then we can switch over to heparin.  I will plan to see her back in November.  At that time, we will then make the switch over to heparin.  I do not anticipate any problems with  her delivery.  I would think that she could have induction without any problems.  I told her that if her obstetrician has any issues or any concerns, she can always call me.     Josph Macho, MD 8/26/20243:13 PM

## 2023-05-07 ENCOUNTER — Telehealth: Payer: Self-pay | Admitting: Hematology & Oncology

## 2023-05-07 NOTE — Telephone Encounter (Signed)
Contacted pt to schedule an appt. Unable to reach via phone, voicemail was left.     Follow-Up Information  Follow-up disposition: Return in about 2 months (around 07/18/2023) for md labs, appt.

## 2023-05-14 ENCOUNTER — Encounter: Payer: Self-pay | Admitting: *Deleted

## 2023-05-14 DIAGNOSIS — D259 Leiomyoma of uterus, unspecified: Secondary | ICD-10-CM | POA: Insufficient documentation

## 2023-05-14 DIAGNOSIS — Z9189 Other specified personal risk factors, not elsewhere classified: Secondary | ICD-10-CM | POA: Insufficient documentation

## 2023-05-20 ENCOUNTER — Other Ambulatory Visit: Payer: Self-pay | Admitting: *Deleted

## 2023-05-20 ENCOUNTER — Ambulatory Visit: Payer: No Typology Code available for payment source | Attending: Obstetrics

## 2023-05-20 ENCOUNTER — Ambulatory Visit: Payer: No Typology Code available for payment source

## 2023-05-20 DIAGNOSIS — O3412 Maternal care for benign tumor of corpus uteri, second trimester: Secondary | ICD-10-CM

## 2023-05-20 DIAGNOSIS — O99282 Endocrine, nutritional and metabolic diseases complicating pregnancy, second trimester: Secondary | ICD-10-CM | POA: Diagnosis not present

## 2023-05-20 DIAGNOSIS — O409XX0 Polyhydramnios, unspecified trimester, not applicable or unspecified: Secondary | ICD-10-CM

## 2023-05-20 DIAGNOSIS — O3410 Maternal care for benign tumor of corpus uteri, unspecified trimester: Secondary | ICD-10-CM | POA: Diagnosis present

## 2023-05-20 DIAGNOSIS — D259 Leiomyoma of uterus, unspecified: Secondary | ICD-10-CM | POA: Insufficient documentation

## 2023-05-20 DIAGNOSIS — Z3A26 26 weeks gestation of pregnancy: Secondary | ICD-10-CM

## 2023-05-20 DIAGNOSIS — E8802 Plasminogen deficiency: Secondary | ICD-10-CM

## 2023-05-30 LAB — OB RESULTS CONSOLE RPR: RPR: NONREACTIVE

## 2023-06-17 ENCOUNTER — Ambulatory Visit: Payer: No Typology Code available for payment source | Attending: Maternal & Fetal Medicine

## 2023-06-17 ENCOUNTER — Other Ambulatory Visit: Payer: Self-pay | Admitting: *Deleted

## 2023-06-17 DIAGNOSIS — D6859 Other primary thrombophilia: Secondary | ICD-10-CM

## 2023-06-17 DIAGNOSIS — O409XX0 Polyhydramnios, unspecified trimester, not applicable or unspecified: Secondary | ICD-10-CM | POA: Diagnosis present

## 2023-06-17 DIAGNOSIS — O99113 Other diseases of the blood and blood-forming organs and certain disorders involving the immune mechanism complicating pregnancy, third trimester: Secondary | ICD-10-CM

## 2023-06-17 DIAGNOSIS — Z3A30 30 weeks gestation of pregnancy: Secondary | ICD-10-CM

## 2023-06-17 DIAGNOSIS — D259 Leiomyoma of uterus, unspecified: Secondary | ICD-10-CM | POA: Insufficient documentation

## 2023-06-17 DIAGNOSIS — O3412 Maternal care for benign tumor of corpus uteri, second trimester: Secondary | ICD-10-CM | POA: Insufficient documentation

## 2023-06-17 DIAGNOSIS — O403XX0 Polyhydramnios, third trimester, not applicable or unspecified: Secondary | ICD-10-CM

## 2023-06-17 DIAGNOSIS — O3413 Maternal care for benign tumor of corpus uteri, third trimester: Secondary | ICD-10-CM | POA: Diagnosis not present

## 2023-07-08 ENCOUNTER — Inpatient Hospital Stay: Payer: No Typology Code available for payment source | Admitting: Hematology & Oncology

## 2023-07-08 ENCOUNTER — Inpatient Hospital Stay: Payer: No Typology Code available for payment source

## 2023-07-16 ENCOUNTER — Inpatient Hospital Stay: Payer: No Typology Code available for payment source | Attending: Family

## 2023-07-16 ENCOUNTER — Inpatient Hospital Stay (HOSPITAL_BASED_OUTPATIENT_CLINIC_OR_DEPARTMENT_OTHER): Payer: No Typology Code available for payment source | Admitting: Hematology & Oncology

## 2023-07-16 ENCOUNTER — Other Ambulatory Visit: Payer: Self-pay

## 2023-07-16 ENCOUNTER — Encounter: Payer: Self-pay | Admitting: Hematology & Oncology

## 2023-07-16 VITALS — BP 106/60 | HR 88 | Temp 98.5°F | Resp 17 | Ht 66.0 in | Wt 179.0 lb

## 2023-07-16 DIAGNOSIS — D259 Leiomyoma of uterus, unspecified: Secondary | ICD-10-CM

## 2023-07-16 DIAGNOSIS — O00219 Unspecified ovarian pregnancy with intrauterine pregnancy: Secondary | ICD-10-CM

## 2023-07-16 DIAGNOSIS — E8802 Plasminogen deficiency: Secondary | ICD-10-CM | POA: Insufficient documentation

## 2023-07-16 DIAGNOSIS — O26893 Other specified pregnancy related conditions, third trimester: Secondary | ICD-10-CM | POA: Insufficient documentation

## 2023-07-16 DIAGNOSIS — O0000 Abdominal pregnancy without intrauterine pregnancy: Secondary | ICD-10-CM

## 2023-07-16 DIAGNOSIS — Z3A00 Weeks of gestation of pregnancy not specified: Secondary | ICD-10-CM | POA: Diagnosis not present

## 2023-07-16 DIAGNOSIS — Z7901 Long term (current) use of anticoagulants: Secondary | ICD-10-CM | POA: Insufficient documentation

## 2023-07-16 LAB — CBC WITH DIFFERENTIAL (CANCER CENTER ONLY)
Abs Immature Granulocytes: 0.05 10*3/uL (ref 0.00–0.07)
Basophils Absolute: 0 10*3/uL (ref 0.0–0.1)
Basophils Relative: 0 %
Eosinophils Absolute: 0 10*3/uL (ref 0.0–0.5)
Eosinophils Relative: 0 %
HCT: 33.6 % — ABNORMAL LOW (ref 36.0–46.0)
Hemoglobin: 10.6 g/dL — ABNORMAL LOW (ref 12.0–15.0)
Immature Granulocytes: 1 %
Lymphocytes Relative: 17 %
Lymphs Abs: 1.3 10*3/uL (ref 0.7–4.0)
MCH: 27.5 pg (ref 26.0–34.0)
MCHC: 31.5 g/dL (ref 30.0–36.0)
MCV: 87 fL (ref 80.0–100.0)
Monocytes Absolute: 0.3 10*3/uL (ref 0.1–1.0)
Monocytes Relative: 4 %
Neutro Abs: 5.8 10*3/uL (ref 1.7–7.7)
Neutrophils Relative %: 78 %
Platelet Count: 309 10*3/uL (ref 150–400)
RBC: 3.86 MIL/uL — ABNORMAL LOW (ref 3.87–5.11)
RDW: 15.7 % — ABNORMAL HIGH (ref 11.5–15.5)
WBC Count: 7.4 10*3/uL (ref 4.0–10.5)
nRBC: 0.3 % — ABNORMAL HIGH (ref 0.0–0.2)

## 2023-07-16 LAB — CMP (CANCER CENTER ONLY)
ALT: 7 U/L (ref 0–44)
AST: 11 U/L — ABNORMAL LOW (ref 15–41)
Albumin: 3.7 g/dL (ref 3.5–5.0)
Alkaline Phosphatase: 105 U/L (ref 38–126)
Anion gap: 7 (ref 5–15)
BUN: 7 mg/dL (ref 6–20)
CO2: 24 mmol/L (ref 22–32)
Calcium: 8.9 mg/dL (ref 8.9–10.3)
Chloride: 104 mmol/L (ref 98–111)
Creatinine: 0.55 mg/dL (ref 0.44–1.00)
GFR, Estimated: >60 mL/min (ref >60)
Glucose, Bld: 118 mg/dL — ABNORMAL HIGH (ref 70–99)
Potassium: 3.9 mmol/L (ref 3.5–5.1)
Sodium: 135 mmol/L (ref 135–145)
Total Bilirubin: 0.3 mg/dL (ref <1.2)
Total Protein: 6.6 g/dL (ref 6.5–8.1)

## 2023-07-16 LAB — LACTATE DEHYDROGENASE: LDH: 137 U/L (ref 98–192)

## 2023-07-16 NOTE — Progress Notes (Signed)
Hematology and Oncology Follow Up Visit  Rebecca Benjamin [id number] 07-29-1991 32 y.o. 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 Essner is back for follow-up.  She is doing quite well.  She has he is going to be induced on December 2.  This is actually a very good day since this is the day when my daughter was born.  Unfortunately, she has not been taking the Lovenox every day.  She is just worried about bleeding with Lovenox.  I try to reassure her that she is not clear to bleed from the Lovenox.  I know that this has been very difficult for her emotionally.  I told her that she can probably stop the Lovenox on the day after Thanksgiving if she is going to give birth on the second.  I have also told her that she is going need to be on Lovenox for 4 weeks after delivery.  I think this would be helpful to help prevent thromboembolic disease.  She has had no cough or chest wall pain.  She has had no leg swelling.  She is going to the bathroom quite a bit.  Overall, I would say that her performance status is probably ECOG 1.     Medications:  Current Outpatient Medications:    docusate sodium (COLACE) 100 MG capsule, Take by mouth 4 (four) times daily., Disp: , Rfl:    enoxaparin (LOVENOX) 80 MG/0.8ML injection, Inject 0.8 mLs (80 mg total) into the skin daily., Disp: 30 mL, Rfl: 3   Prenatal MV & Min w/FA-DHA (PRENATAL GUMMIES PO), Take by mouth daily., Disp: , Rfl:    Psyllium (METAMUCIL) 48.57 % POWD, 1 tablespoon Orally twice a day, Disp: , Rfl:   Allergies:  Allergies  Allergen Reactions   Amoxicillin Nausea And Vomiting    Has patient had a PCN reaction causing immediate rash, facial/tongue/throat swelling, SOB or lightheadedness with hypotension: no  Has patient had a PCN reaction causing severe rash involving mucus membranes or skin necrosis: no  Has patient had  a PCN reaction that required hospitalization no  Has patient had a PCN reaction occurring within the last 10 years: no  If all of the above answers are "NO", then may proceed with Cephalosporin use.   Latex Itching    Other Reaction(s): irritate   Sumatriptan     C/o felt generalized flushing, burning, rhinorrhea within 2-3 min of medication  Other Reaction(s): bad reactrion    Past Medical History, Surgical history, Social history, and Family History were reviewed and updated.  Review of Systems: Review of Systems  Constitutional: Negative.   HENT:  Negative.    Eyes: Negative.   Respiratory: Negative.    Cardiovascular: Negative.   Gastrointestinal: Negative.   Endocrine: Negative.   Genitourinary: Negative.    Musculoskeletal: Negative.   Skin: Negative.   Neurological: Negative.   Hematological: Negative.   Psychiatric/Behavioral: Negative.      Physical Exam:  height is 5\' 6"  (1.676 m) and weight is 179 lb (81.2 kg). Her oral temperature is 98.5 F (36.9 C). Her blood pressure is 106/60 and her pulse is 88. Her respiration is 17 and oxygen saturation is 100%.   Wt Readings from Last 3 Encounters:  07/16/23 179 lb (81.2 kg)  05/06/23 173 lb (78.5 kg)  03/15/23 170 lb (77.1 kg)    Physical Exam Vitals  reviewed.  HENT:     Head: Normocephalic and atraumatic.  Eyes:     Pupils: Pupils are equal, round, and reactive to light.  Cardiovascular:     Rate and Rhythm: Normal rate and regular rhythm.     Heart sounds: Normal heart sounds.  Pulmonary:     Effort: Pulmonary effort is normal.     Breath sounds: Normal breath sounds.  Abdominal:     General: Bowel sounds are normal.     Palpations: Abdomen is soft.     Comments: Her abdomen is soft.  She is pregnant.  She has no obvious fluid wave.  There is no palpable liver or spleen tip.  Musculoskeletal:        General: No tenderness or deformity. Normal range of motion.     Cervical back: Normal range of motion.      Comments: Extremities shows no clubbing, cyanosis or edema.  Lymphadenopathy:     Cervical: No cervical adenopathy.  Skin:    General: Skin is warm and dry.     Findings: No erythema or rash.  Neurological:     Mental Status: She is alert and oriented to person, place, and time.  Psychiatric:        Behavior: Behavior normal.        Thought Content: Thought content normal.        Judgment: Judgment normal.      Lab Results  Component Value Date   WBC 7.4 07/16/2023   HGB 10.6 (L) 07/16/2023   HCT 33.6 (L) 07/16/2023   MCV 87.0 07/16/2023   PLT 309 07/16/2023     Chemistry      Component Value Date/Time   NA 135 07/16/2023 0833   K 3.9 07/16/2023 0833   CL 104 07/16/2023 0833   CO2 24 07/16/2023 0833   BUN 7 07/16/2023 0833   CREATININE 0.55 07/16/2023 0833      Component Value Date/Time   CALCIUM 8.9 07/16/2023 0833   ALKPHOS 105 07/16/2023 0833   AST 11 (L) 07/16/2023 0833   ALT 7 07/16/2023 0833   BILITOT 0.3 07/16/2023 0833      Impression and Plan: Ms. Goodloe is a very nice 32 year old African-American female.  She comes in with her husband and 65-year-old son.  Both she and her husband were in the [employer].  As such, they are true American hero's.  We really have to do our best to make sure that she has this child.  She is on Lovenox right now.  I think that we are okay to keep her on Lovenox for right now.  I really do not think we had to switch over to heparin.  Again, her induction dates can be December 2.  She will continue the Lovenox until November 29.  If there is any indication that she is going to go sooner, she will let us know.  I would like to get her back to see her about 6 to 8 weeks.  By then, she will be off Lovenox.    Josph Macho, MD 11/5/20249:07 AM

## 2023-07-18 LAB — OB RESULTS CONSOLE GBS: GBS: NEGATIVE

## 2023-07-20 LAB — OB RESULTS CONSOLE GBS: GBS: NEGATIVE

## 2023-07-29 ENCOUNTER — Ambulatory Visit: Payer: No Typology Code available for payment source | Attending: Obstetrics

## 2023-07-29 ENCOUNTER — Other Ambulatory Visit: Payer: Self-pay

## 2023-07-29 ENCOUNTER — Other Ambulatory Visit: Payer: Self-pay | Admitting: *Deleted

## 2023-07-29 DIAGNOSIS — O403XX0 Polyhydramnios, third trimester, not applicable or unspecified: Secondary | ICD-10-CM | POA: Diagnosis not present

## 2023-07-29 DIAGNOSIS — O2623 Pregnancy care for patient with recurrent pregnancy loss, third trimester: Secondary | ICD-10-CM

## 2023-07-29 DIAGNOSIS — D259 Leiomyoma of uterus, unspecified: Secondary | ICD-10-CM | POA: Diagnosis present

## 2023-07-29 DIAGNOSIS — Z3A36 36 weeks gestation of pregnancy: Secondary | ICD-10-CM

## 2023-07-29 DIAGNOSIS — O3413 Maternal care for benign tumor of corpus uteri, third trimester: Secondary | ICD-10-CM | POA: Diagnosis present

## 2023-07-29 DIAGNOSIS — O320XX0 Maternal care for unstable lie, not applicable or unspecified: Secondary | ICD-10-CM

## 2023-07-31 DIAGNOSIS — O409XX0 Polyhydramnios, unspecified trimester, not applicable or unspecified: Secondary | ICD-10-CM | POA: Insufficient documentation

## 2023-07-31 DIAGNOSIS — O320XX0 Maternal care for unstable lie, not applicable or unspecified: Secondary | ICD-10-CM | POA: Insufficient documentation

## 2023-08-05 ENCOUNTER — Other Ambulatory Visit: Payer: Self-pay

## 2023-08-05 ENCOUNTER — Other Ambulatory Visit: Payer: Self-pay | Admitting: *Deleted

## 2023-08-05 ENCOUNTER — Ambulatory Visit: Payer: No Typology Code available for payment source | Attending: Maternal & Fetal Medicine

## 2023-08-05 VITALS — BP 114/76

## 2023-08-05 DIAGNOSIS — O3413 Maternal care for benign tumor of corpus uteri, third trimester: Secondary | ICD-10-CM | POA: Diagnosis not present

## 2023-08-05 DIAGNOSIS — O403XX0 Polyhydramnios, third trimester, not applicable or unspecified: Secondary | ICD-10-CM | POA: Diagnosis present

## 2023-08-05 DIAGNOSIS — O99113 Other diseases of the blood and blood-forming organs and certain disorders involving the immune mechanism complicating pregnancy, third trimester: Secondary | ICD-10-CM

## 2023-08-05 DIAGNOSIS — O409XX0 Polyhydramnios, unspecified trimester, not applicable or unspecified: Secondary | ICD-10-CM

## 2023-08-05 DIAGNOSIS — D6859 Other primary thrombophilia: Secondary | ICD-10-CM | POA: Diagnosis not present

## 2023-08-05 DIAGNOSIS — D259 Leiomyoma of uterus, unspecified: Secondary | ICD-10-CM

## 2023-08-05 DIAGNOSIS — O320XX0 Maternal care for unstable lie, not applicable or unspecified: Secondary | ICD-10-CM

## 2023-08-05 DIAGNOSIS — Z3A37 37 weeks gestation of pregnancy: Secondary | ICD-10-CM

## 2023-08-05 DIAGNOSIS — O2623 Pregnancy care for patient with recurrent pregnancy loss, third trimester: Secondary | ICD-10-CM

## 2023-08-13 ENCOUNTER — Other Ambulatory Visit: Payer: Self-pay

## 2023-08-13 ENCOUNTER — Ambulatory Visit (HOSPITAL_BASED_OUTPATIENT_CLINIC_OR_DEPARTMENT_OTHER): Payer: No Typology Code available for payment source

## 2023-08-13 DIAGNOSIS — O3413 Maternal care for benign tumor of corpus uteri, third trimester: Secondary | ICD-10-CM

## 2023-08-13 DIAGNOSIS — Z3A38 38 weeks gestation of pregnancy: Secondary | ICD-10-CM

## 2023-08-13 DIAGNOSIS — O320XX0 Maternal care for unstable lie, not applicable or unspecified: Secondary | ICD-10-CM | POA: Insufficient documentation

## 2023-08-13 DIAGNOSIS — D259 Leiomyoma of uterus, unspecified: Secondary | ICD-10-CM

## 2023-08-13 DIAGNOSIS — O403XX0 Polyhydramnios, third trimester, not applicable or unspecified: Secondary | ICD-10-CM | POA: Insufficient documentation

## 2023-08-13 DIAGNOSIS — O2623 Pregnancy care for patient with recurrent pregnancy loss, third trimester: Secondary | ICD-10-CM

## 2023-08-14 ENCOUNTER — Encounter (HOSPITAL_COMMUNITY): Payer: Self-pay | Admitting: Obstetrics and Gynecology

## 2023-08-14 ENCOUNTER — Inpatient Hospital Stay (HOSPITAL_COMMUNITY): Payer: No Typology Code available for payment source | Admitting: Anesthesiology

## 2023-08-14 ENCOUNTER — Inpatient Hospital Stay (HOSPITAL_COMMUNITY)
Admission: AD | Admit: 2023-08-14 | Discharge: 2023-08-16 | DRG: 806 | Disposition: A | Discharge status: Home / Self Care | Payer: No Typology Code available for payment source | Attending: Obstetrics and Gynecology | Admitting: Obstetrics and Gynecology

## 2023-08-14 DIAGNOSIS — Z818 Family history of other mental and behavioral disorders: Secondary | ICD-10-CM

## 2023-08-14 DIAGNOSIS — O99284 Endocrine, nutritional and metabolic diseases complicating childbirth: Secondary | ICD-10-CM | POA: Diagnosis present

## 2023-08-14 DIAGNOSIS — Z823 Family history of stroke: Secondary | ICD-10-CM | POA: Diagnosis not present

## 2023-08-14 DIAGNOSIS — O403XX0 Polyhydramnios, third trimester, not applicable or unspecified: Secondary | ICD-10-CM | POA: Diagnosis present

## 2023-08-14 DIAGNOSIS — Z833 Family history of diabetes mellitus: Secondary | ICD-10-CM | POA: Diagnosis not present

## 2023-08-14 DIAGNOSIS — O26893 Other specified pregnancy related conditions, third trimester: Secondary | ICD-10-CM | POA: Diagnosis present

## 2023-08-14 DIAGNOSIS — O9902 Anemia complicating childbirth: Secondary | ICD-10-CM | POA: Diagnosis present

## 2023-08-14 DIAGNOSIS — Z3A39 39 weeks gestation of pregnancy: Secondary | ICD-10-CM

## 2023-08-14 DIAGNOSIS — E7212 Methylenetetrahydrofolate reductase deficiency: Secondary | ICD-10-CM | POA: Diagnosis present

## 2023-08-14 DIAGNOSIS — Z8249 Family history of ischemic heart disease and other diseases of the circulatory system: Secondary | ICD-10-CM

## 2023-08-14 LAB — CBC
HCT: 36.7 % (ref 36.0–46.0)
Hemoglobin: 11.8 g/dL — ABNORMAL LOW (ref 12.0–15.0)
MCH: 27.3 pg (ref 26.0–34.0)
MCHC: 32.2 g/dL (ref 30.0–36.0)
MCV: 85 fL (ref 80.0–100.0)
Platelets: 300 10*3/uL (ref 150–400)
RBC: 4.32 MIL/uL (ref 3.87–5.11)
RDW: 17.1 % — ABNORMAL HIGH (ref 11.5–15.5)
WBC: 10.5 10*3/uL (ref 4.0–10.5)
nRBC: 0 % (ref 0.0–0.2)

## 2023-08-14 LAB — TYPE AND SCREEN
ABO/RH(D): A POS
Antibody Screen: NEGATIVE

## 2023-08-14 MED ORDER — EPHEDRINE 5 MG/ML INJ: 10.0000 mg | INTRAVENOUS | Status: DC | PRN

## 2023-08-14 MED ORDER — ACETAMINOPHEN 325 MG PO TABS: 650.0000 mg | ORAL_TABLET | ORAL | Status: DC | PRN

## 2023-08-14 MED ORDER — FENTANYL-BUPIVACAINE-NACL 0.5-0.125-0.9 MG/250ML-% EP SOLN
12.0000 mL/h | EPIDURAL | Status: DC | PRN
Start: 2023-08-14 — End: 2023-08-15
  Administered 2023-08-14: 12 mL/h via EPIDURAL
  Filled 2023-08-14: qty 250

## 2023-08-14 MED ORDER — PHENYLEPHRINE 80 MCG/ML (10ML) SYRINGE FOR IV PUSH (FOR BLOOD PRESSURE SUPPORT): 80.0000 ug | PREFILLED_SYRINGE | INTRAVENOUS | Status: DC | PRN

## 2023-08-14 MED ORDER — LACTATED RINGERS IV SOLN: INTRAVENOUS | Status: DC

## 2023-08-14 MED ORDER — LIDOCAINE HCL (PF) 1 % IJ SOLN
30.0000 mL | INTRAMUSCULAR | Status: AC | PRN
  Administered 2023-08-14: 30 mL via SUBCUTANEOUS
  Filled 2023-08-14: qty 30

## 2023-08-14 MED ORDER — LACTATED RINGERS IV SOLN: 500.0000 mL | INTRAVENOUS | Status: DC | PRN

## 2023-08-14 MED ORDER — OXYTOCIN-SODIUM CHLORIDE 30-0.9 UT/500ML-% IV SOLN
2.5000 [IU]/h | INTRAVENOUS | Status: DC
  Administered 2023-08-14: 2.5 [IU]/h via INTRAVENOUS
  Filled 2023-08-14: qty 500

## 2023-08-14 MED ORDER — OXYTOCIN BOLUS FROM INFUSION
333.0000 mL | Freq: Once | INTRAVENOUS | Status: AC
  Administered 2023-08-14: 333 mL via INTRAVENOUS

## 2023-08-14 MED ORDER — FENTANYL CITRATE (PF) 100 MCG/2ML IJ SOLN
100.0000 ug | Freq: Once | INTRAMUSCULAR | Status: AC
  Administered 2023-08-14: 100 ug via INTRAVENOUS
  Filled 2023-08-14: qty 2

## 2023-08-14 MED ORDER — DIPHENHYDRAMINE HCL 50 MG/ML IJ SOLN: 12.5000 mg | INTRAMUSCULAR | Status: DC | PRN

## 2023-08-14 MED ORDER — SOD CITRATE-CITRIC ACID 500-334 MG/5ML PO SOLN: 30.0000 mL | ORAL | Status: DC | PRN

## 2023-08-14 MED ORDER — OXYCODONE-ACETAMINOPHEN 5-325 MG PO TABS
1.0000 | ORAL_TABLET | ORAL | Status: DC | PRN
Start: 2023-08-14 — End: 2023-08-15

## 2023-08-14 MED ORDER — OXYTOCIN 10 UNIT/ML IJ SOLN: 10.0000 [IU] | Freq: Once | INTRAMUSCULAR | Status: DC

## 2023-08-14 MED ORDER — OXYCODONE-ACETAMINOPHEN 5-325 MG PO TABS
2.0000 | ORAL_TABLET | ORAL | Status: DC | PRN
Start: 2023-08-14 — End: 2023-08-15

## 2023-08-14 MED ORDER — LIDOCAINE-EPINEPHRINE (PF) 2 %-1:200000 IJ SOLN
INTRAMUSCULAR | Status: DC | PRN
  Administered 2023-08-14: 5 mL via EPIDURAL

## 2023-08-14 MED ORDER — ONDANSETRON HCL 4 MG/2ML IJ SOLN: 4.0000 mg | Freq: Four times a day (QID) | INTRAMUSCULAR | Status: DC | PRN

## 2023-08-14 MED ORDER — BUPIVACAINE HCL (PF) 0.25 % IJ SOLN
INTRAMUSCULAR | Status: DC | PRN
  Administered 2023-08-14 (×2): 5 mL via EPIDURAL

## 2023-08-14 NOTE — Anesthesia Preprocedure Evaluation (Addendum)
Anesthesia Evaluation  Patient identified by MRN, date of birth, ID band Patient awake    Reviewed: Allergy & Precautions, NPO status , Patient's Chart, lab work & pertinent test results, reviewed documented beta blocker date and time   History of Anesthesia Complications Negative for: history of anesthetic complications  Airway Mallampati: IV  TM Distance: >3 FB     Dental no notable dental hx.    Pulmonary    breath sounds clear to auscultation       Cardiovascular (-) hypertension(-) angina (-) CAD and (-) Past MI  Rhythm:Regular Rate:Normal     Neuro/Psych  Headaches, neg Seizures    GI/Hepatic   Endo/Other    Renal/GU      Musculoskeletal   Abdominal   Peds  Hematology  (+) Blood dyscrasia, anemia   Anesthesia Other Findings   Reproductive/Obstetrics                             Anesthesia Physical Anesthesia Plan  ASA: 2  Anesthesia Plan: Epidural   Post-op Pain Management:    Induction:   PONV Risk Score and Plan: 1 and Ondansetron  Airway Management Planned:   Additional Equipment:   Intra-op Plan:   Post-operative Plan:   Informed Consent: I have reviewed the patients History and Physical, chart, labs and discussed the procedure including the risks, benefits and alternatives for the proposed anesthesia with the patient or authorized representative who has indicated his/her understanding and acceptance.     Dental advisory given  Plan Discussed with:   Anesthesia Plan Comments:        Anesthesia Quick Evaluation

## 2023-08-14 NOTE — MAU Note (Signed)
..  Rebecca Benjamin is a 32 y.o. at [redacted]w[redacted]d here in MAU reporting: brought directly from lobby d/t "something coming out" Patient grossly ruptured, reports clear fluid an hour ago.  Dr Cherly Hensen at bedside 2125  Pain score: 10/10  FHT:135

## 2023-08-14 NOTE — Anesthesia Procedure Notes (Signed)
Epidural Patient location during procedure: OB Start time: 08/14/2023 10:33 PM End time: 08/14/2023 10:37 PM  Staffing Anesthesiologist: Mariann Barter, MD Performed: anesthesiologist   Preanesthetic Checklist Completed: patient identified, IV checked, site marked, risks and benefits discussed, surgical consent, monitors and equipment checked, pre-op evaluation and timeout performed  Epidural Patient position: sitting Prep: DuraPrep Patient monitoring: heart rate, continuous pulse ox and blood pressure Approach: midline Location: L4-L5 Injection technique: LOR saline  Needle:  Needle type: Tuohy  Needle gauge: 17 G Needle length: 9 cm Needle insertion depth: 5 cm Catheter type: closed end flexible Catheter size: 19 Gauge Catheter at skin depth: 10 cm Test dose: negative and 1.5% lidocaine with Epi 1:200 K  Assessment Events: blood not aspirated, no cerebrospinal fluid, injection not painful, no injection resistance, no paresthesia and negative IV test  Additional Notes Reason for block:procedure for pain

## 2023-08-14 NOTE — H&P (Signed)
Rebecca Benjamin is a 32 y.o. female presenting for admission due to SROM clear fluid and active labor. GBS cx neg. PNC complicated by polyhydramnios. 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 Lovenox has been discontinued by pt over 1 wk ago. 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   Past Surgical History:  Procedure Laterality Date   DILATION AND CURETTAGE OF UTERUS     DILATION AND EVACUATION N/A 10/26/2016   Procedure: DILATATION AND EVACUATION;  Surgeon: Jaymes Graff, MD;  Location: [facility];  Service: Gynecology;  Laterality: N/A;   FOOT SURGERY Left 2014 and 2015   LAPAROSCOPY Left 10/26/2016   Procedure: LAPAROSCOPY OPERATIVE;  Surgeon: Jaymes Graff, MD;  Location: [facility];  Service: Gynecology;  Laterality: Left;  removal of ectopic pregnancy    TONSILLECTOMY     Family History: family history includes Cancer in her maternal grandfather; Depression in her mother; Diabetes in her mother; Healthy in her father; Hypertension in her mother; Migraines in her paternal grandmother; Mitral valve prolapse in her mother; Stroke in her maternal grandmother. Social History:  reports that she has never smoked. She has never used smokeless tobacco. She reports that she does not currently use alcohol. She reports that she does not use drugs.     Maternal Diabetes: No Genetic Screening: Normal Maternal  Ultrasounds/Referrals: Normal Fetal Ultrasounds or other Referrals:  Referred to Materal Fetal Medicine  Maternal Substance Abuse:  No Significant Maternal Medications:  None Significant Maternal Lab Results:  Group B Strep negative Number of Prenatal Visits:greater than 3 verified prenatal visits Maternal Vaccinations:TDap Other Comments:   PAI 4g/5G, MTHFR deficiency  Review of Systems  All other systems reviewed and are negative.  History Dilation: 8 Effacement (%): 80 Station: -1 Exam by:: Wynelle Bourgeois, CNM Blood pressure 130/70, pulse 96, resp. rate 20, SpO2 100%. Exam Physical Exam Constitutional:      General: She is in acute distress.     Appearance: Normal appearance.  Eyes:     Extraocular Movements: Extraocular movements intact.  Cardiovascular:     Rate and Rhythm: Normal rate and regular rhythm.  Pulmonary:     Effort: Pulmonary effort is normal.  Abdominal:     Comments: gravid  Musculoskeletal:     Cervical back: Neck supple.  Neurological:     General: No focal deficit present.     Mental Status: She is oriented to person, place, and time.  Psychiatric:        Mood and Affect: Mood normal.        Behavior: Behavior normal.     Prenatal labs: ABO, Rh: --/--/PENDING (12/04  2130) Antibody: PENDING (12/04 2130) Rubella:  Immune RPR:   NR HBsAg:   neg HIV:   negative GBS: Negative/-- (11/09 0000)   Assessment/Plan: Active labor IUP @ 39wk P)admit routine labs. Epidural   Pammy Vesey A Finlay Godbee 08/14/2023, 10:18 PM

## 2023-08-15 ENCOUNTER — Encounter (HOSPITAL_COMMUNITY): Payer: Self-pay | Admitting: Obstetrics and Gynecology

## 2023-08-15 LAB — CBC
HCT: 30.1 % — ABNORMAL LOW (ref 36.0–46.0)
Hemoglobin: 9.4 g/dL — ABNORMAL LOW (ref 12.0–15.0)
MCH: 26.2 pg (ref 26.0–34.0)
MCHC: 31.2 g/dL (ref 30.0–36.0)
MCV: 83.8 fL (ref 80.0–100.0)
Platelets: 231 10*3/uL (ref 150–400)
RBC: 3.59 MIL/uL — ABNORMAL LOW (ref 3.87–5.11)
RDW: 16.7 % — ABNORMAL HIGH (ref 11.5–15.5)
WBC: 16.1 10*3/uL — ABNORMAL HIGH (ref 4.0–10.5)
nRBC: 0 % (ref 0.0–0.2)

## 2023-08-15 LAB — RPR: RPR Ser Ql: NONREACTIVE

## 2023-08-15 MED ORDER — OXYCODONE HCL 5 MG PO TABS: 10.0000 mg | ORAL_TABLET | ORAL | Status: DC | PRN

## 2023-08-15 MED ORDER — COCONUT OIL OIL
1.0000 | TOPICAL_OIL | Status: DC | PRN
  Administered 2023-08-15: 1 via TOPICAL

## 2023-08-15 MED ORDER — WITCH HAZEL-GLYCERIN EX PADS
1.0000 | MEDICATED_PAD | CUTANEOUS | Status: DC | PRN
  Administered 2023-08-15: 1 via TOPICAL

## 2023-08-15 MED ORDER — OXYCODONE HCL 5 MG PO TABS: 5.0000 mg | ORAL_TABLET | ORAL | Status: DC | PRN

## 2023-08-15 MED ORDER — PRENATAL MULTIVITAMIN CH
1.0000 | ORAL_TABLET | Freq: Every day | ORAL | Status: DC
  Administered 2023-08-15 – 2023-08-16 (×2): 1 via ORAL
  Filled 2023-08-15 (×2): qty 1

## 2023-08-15 MED ORDER — DIBUCAINE (PERIANAL) 1 % EX OINT: 1.0000 | TOPICAL_OINTMENT | CUTANEOUS | Status: DC | PRN

## 2023-08-15 MED ORDER — SIMETHICONE 80 MG PO CHEW: 80.0000 mg | CHEWABLE_TABLET | ORAL | Status: DC | PRN

## 2023-08-15 MED ORDER — FERROUS SULFATE 325 (65 FE) MG PO TABS
325.0000 mg | ORAL_TABLET | Freq: Two times a day (BID) | ORAL | Status: DC
  Administered 2023-08-15 – 2023-08-16 (×3): 325 mg via ORAL
  Filled 2023-08-15 (×3): qty 1

## 2023-08-15 MED ORDER — BENZOCAINE-MENTHOL 20-0.5 % EX AERO
1.0000 | INHALATION_SPRAY | CUTANEOUS | Status: DC | PRN
  Administered 2023-08-15 – 2023-08-16 (×2): 1 via TOPICAL
  Filled 2023-08-15 (×2): qty 56

## 2023-08-15 MED ORDER — SENNOSIDES-DOCUSATE SODIUM 8.6-50 MG PO TABS
2.0000 | ORAL_TABLET | Freq: Every day | ORAL | Status: DC
  Administered 2023-08-15 – 2023-08-16 (×2): 2 via ORAL
  Filled 2023-08-15 (×2): qty 2

## 2023-08-15 MED ORDER — ACETAMINOPHEN 325 MG PO TABS
650.0000 mg | ORAL_TABLET | ORAL | Status: DC | PRN
  Administered 2023-08-15: 650 mg via ORAL
  Filled 2023-08-15: qty 2

## 2023-08-15 MED ORDER — ENOXAPARIN SODIUM 80 MG/0.8ML IJ SOSY
80.0000 mg | PREFILLED_SYRINGE | INTRAMUSCULAR | Status: DC
  Administered 2023-08-15 – 2023-08-16 (×2): 80 mg via SUBCUTANEOUS
  Filled 2023-08-15 (×2): qty 0.8

## 2023-08-15 MED ORDER — IBUPROFEN 600 MG PO TABS
600.0000 mg | ORAL_TABLET | Freq: Four times a day (QID) | ORAL | Status: DC
  Administered 2023-08-15 – 2023-08-16 (×6): 600 mg via ORAL
  Filled 2023-08-15 (×6): qty 1

## 2023-08-15 MED ORDER — ONDANSETRON HCL 4 MG/2ML IJ SOLN
4.0000 mg | INTRAMUSCULAR | Status: DC | PRN
Start: 2023-08-15 — End: 2023-08-16

## 2023-08-15 MED ORDER — ZOLPIDEM TARTRATE 5 MG PO TABS: 5.0000 mg | ORAL_TABLET | Freq: Every evening | ORAL | Status: DC | PRN

## 2023-08-15 MED ORDER — DIPHENHYDRAMINE HCL 25 MG PO CAPS: 25.0000 mg | ORAL_CAPSULE | Freq: Four times a day (QID) | ORAL | Status: DC | PRN

## 2023-08-15 MED ORDER — ONDANSETRON HCL 4 MG PO TABS
4.0000 mg | ORAL_TABLET | ORAL | Status: DC | PRN
Start: 2023-08-15 — End: 2023-08-16

## 2023-08-15 NOTE — Lactation Note (Signed)
This note was copied from a baby's chart. Lactation Consultation Note  Patient Name: Rebecca Benjamin [id number] Date: 08/15/2023 Age:34 hours Reason for consult: Initial assessment;Early term 36-38.6wks Mom holding baby swaddled. Baby a little on cooler side of temp. Mom has been doing STS. Mom is so excited about the baby. Newborn feeding habits, STS, I&O reviewed. Mom encouraged to feed baby 8-12 times/24 hours and with feeding cues.  Dad came in w/mom's food that she had been waiting on. LC left so mom could eat.  Encouraged mom to call for latch assistance as needed. Mom stated baby had latched again after L&D. Praised mom.  Maternal Data Has patient been taught Hand Expression?: No Does the patient have breastfeeding experience prior to this delivery?: Yes How long did the patient breastfeed?: 6 months  Feeding Mother's Current Feeding Choice: Breast Milk and Formula  LATCH Score Latch: Grasps breast easily, tongue down, lips flanged, rhythmical sucking.  Audible Swallowing: None  Type of Nipple: Everted at rest and after stimulation  Comfort (Breast/Nipple): Soft / non-tender  Hold (Positioning): Assistance needed to correctly position infant at breast and maintain latch.  LATCH Score: 7   Lactation Tools Discussed/Used    Interventions Interventions: Breast feeding basics reviewed;Education;LC Services brochure  Discharge    Consult Status Consult Status: Follow-up Date: 08/15/23 Follow-up type: In-patient    Ishita Mcnerney, Diamond Nickel 08/15/2023, 2:57 AM

## 2023-08-15 NOTE — Progress Notes (Signed)
PPD1 SVD:   S:  Pt reports feeling well  Tolerating po/ Voiding without problems/ No n/v/ Bleeding is light/ Pain controlled withacetaminophen  Newborn info  live female BRF   O:  A & O x 3 / VS: Blood pressure 118/76, pulse 88, temperature 98 F (36.7 C), temperature source Oral, resp. rate 18, SpO2 100%, unknown if currently breastfeeding.  LABS:  Results for orders placed or performed during the hospital encounter of 08/14/23 (from the past 24 hour(s))  CBC     Status: Abnormal   Collection Time: 08/14/23  9:30 PM  Result Value Ref Range   WBC 10.5 4.0 - 10.5 K/uL   RBC 4.32 3.87 - 5.11 MIL/uL   Hemoglobin 11.8 (L) 12.0 - 15.0 g/dL   HCT 29.5 62.1 - 30.8 %   MCV 85.0 80.0 - 100.0 fL   MCH 27.3 26.0 - 34.0 pg   MCHC 32.2 30.0 - 36.0 g/dL   RDW 65.7 (H) 84.6 - 96.2 %   Platelets 300 150 - 400 K/uL   nRBC 0.0 0.0 - 0.2 %  Type and screen [facility]     Status: None   Collection Time: 08/14/23  9:30 PM  Result Value Ref Range   ABO/RH(D) A POS    Antibody Screen NEG    Sample Expiration      08/17/2023,2359 Performed at [facility], [address] [place], [place] [address]   CBC     Status: Abnormal   Collection Time: 08/15/23  5:41 AM  Result Value Ref Range   WBC 16.1 (H) 4.0 - 10.5 K/uL   RBC 3.59 (L) 3.87 - 5.11 MIL/uL   Hemoglobin 9.4 (L) 12.0 - 15.0 g/dL   HCT 13.2 (L) 44.0 - 10.2 %   MCV 83.8 80.0 - 100.0 fL   MCH 26.2 26.0 - 34.0 pg   MCHC 31.2 30.0 - 36.0 g/dL   RDW 72.5 (H) 36.6 - 44.0 %   Platelets 231 150 - 400 K/uL   nRBC 0.0 0.0 - 0.2 %    I&O: I/O last 3 completed shifts: In: -  Out: 100 [Blood:100]   No intake/output data recorded.  Lungs: chest clear, no wheezing, rales, normal symmetric air entry  Heart: regular rate and rhythm, S1, S2 normal, no murmur, click, rub or gallop  Abdomen: benign non-tender, without masses or organomegaly palpable  Perineum: not inspected  Lochia:  moderate Extremities:no redness or  tenderness in the calves or thighs, no edema    A/P: PPD # 1/ [id number]  Doing well  Continue routine post partum orders  Start lovenox today at 11 am

## 2023-08-15 NOTE — Lactation Note (Signed)
This note was copied from a baby's chart. Lactation Consultation Note  Patient Name: Rebecca Benjamin WUJWJ'[id number] Date: 08/15/2023 Age:34 hours  Reason for consult: Follow-up assessment;Early term 37-38.6wks;Breastfeeding assistance  P2, [redacted]w[redacted]d  Follow up LC visit. Mother states baby has been sleepy today and not feed since this morning. She has been attempting to wake her for feeding.   Mother receptive to assistance. Hand expression taught. Did not express colostrum but the process of breast compression was uncomfortable for mother. She will attempt later. Baby cued and she was brought to the breast in football hold. Mother demonstrates good technique with latching however baby quickly latches to mother's nipple and causes her pinching and pain. Infant was re-latched until her lips were flanged and she was deeper on the breast. Infant was continuing to feed when LC exited the room.   Mother was given coconut oil to apply to nipples and a hand pump at her request. Flange size was 21 mm. Instructed mother on the use, frequency, cleaning of breast pump and storage of breast milk.    Mother encouraged to latch baby with feeding cues, place baby skin to skin if not latching, and call for assistance with breastfeeding as needed. Anticipate as baby approaches 24 hours of age, baby will breastfeed more often, 8-12 plus times in 24 hours and "cluster feed".      Maternal Data Has patient been taught Hand Expression?: Yes  Feeding Mother's Current Feeding Choice: Breast Milk and Formula  LATCH Score Latch: Repeated attempts needed to sustain latch, nipple held in mouth throughout feeding, stimulation needed to elicit sucking reflex.  Audible Swallowing: A few with stimulation  Type of Nipple: Everted at rest and after stimulation  Comfort (Breast/Nipple): Soft / non-tender  Hold (Positioning): Assistance needed to correctly position infant at breast and maintain latch.  LATCH Score: 7       Interventions Interventions: Breast feeding basics reviewed;Assisted with latch;Skin to skin;Hand express;Adjust position;Support pillows;Position options;Coconut oil;Education;Hand pump  Discharge Pump: DEBP;Personal;Hands Free  Consult Status Consult Status: Follow-up Date: 08/16/23    Omar Person 08/15/2023, 6:05 PM

## 2023-08-15 NOTE — Anesthesia Postprocedure Evaluation (Signed)
Anesthesia Post Note  Patient: Rebecca Benjamin  Procedure(s) Performed: AN AD HOC LABOR EPIDURAL     Patient location during evaluation: Mother Baby Anesthesia Type: Epidural Level of consciousness: awake and alert and oriented Pain management: satisfactory to patient Vital Signs Assessment: post-procedure vital signs reviewed and stable Respiratory status: respiratory function stable Cardiovascular status: stable Postop Assessment: no headache, no backache, epidural receding, patient able to bend at knees, no signs of nausea or vomiting, adequate PO intake and able to ambulate Anesthetic complications: no   No notable events documented.  Last Vitals:  Vitals:   08/15/23 0202 08/15/23 0544  BP: 127/64 118/76  Pulse: 94 88  Resp: 18 18  Temp: 36.6 C 36.7 C  SpO2: 100% 100%    Last Pain:  Vitals:   08/15/23 0850  TempSrc:   PainSc: 0-No pain   Pain Goal:                   [name]

## 2023-08-15 NOTE — Lactation Note (Signed)
This note was copied from a baby's chart. Lactation Consultation Note  Patient Name: Rebecca Benjamin Date: 08/15/2023 Age:34 hours Reason for consult: L&D Initial assessment;Early term 37-38.6wks Age:71 hours Reason for consult: L&D Initial assessment;Term Assisted baby to the breast. Big brother into see his baby sister. Everyone is excited. Assisted mom in puttin baby on the breast. Baby latched well and BF w/o painful latch. Mom emotional w/happy tears. Baby feeding well. Left parents for bonding time. Will f/u mom on MBU.    Maternal Data Does the patient have breastfeeding experience prior to this delivery?: Yes How long did the patient breastfeed?: 6 months to her now 34 yr old  Feeding    LATCH Score Latch: Grasps breast easily, tongue down, lips flanged, rhythmical sucking.  Audible Swallowing: None  Type of Nipple: Everted at rest and after stimulation  Comfort (Breast/Nipple): Soft / non-tender  Hold (Positioning): Assistance needed to correctly position infant at breast and maintain latch.  LATCH Score: 7   Lactation Tools Discussed/Used    Interventions Interventions: Assisted with latch;Skin to skin;Breast massage;Breast compression;Adjust position;Support pillows;Position options  Discharge    Consult Status Consult Status: Follow-up from L&D Date: 08/15/23 Follow-up type: In-patient    Charyl Dancer 08/15/2023, 12:16 AM

## 2023-08-16 MED ORDER — IBUPROFEN 600 MG PO TABS: 600.0000 mg | ORAL_TABLET | Freq: Four times a day (QID) | ORAL | 11 refills | Status: DC

## 2023-08-16 NOTE — Discharge Summary (Signed)
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 yo [id number] at [redacted]w[redacted]d was admitted in Active Labor on 08/14/2023. Labor course was complicated by none  Membrane Rupture Time/Date: 8:26 PM,08/14/2023  Delivery Method:Vaginal, Spontaneous Operative Delivery:N/A Episiotomy: None Lacerations:  2nd degree;Perineal Patient had a postpartum course complicated by  none.  Lovenox was resumed postpartum. She is ambulating, tolerating a regular diet, passing flatus, and urinating well. Patient is discharged home in stable condition on 08/16/23.  Newborn Data: Birth date:08/14/2023 Birth time:11:03 PM Gender:Female Living status:Living Apgars:5 ,9  Weight:2.93 kg  Magnesium Sulfate received: No BMZ received: No Rhophylac:N/A MMR:No T-DaP:Given prenatally Flu: No RSV Vaccine received: No Transfusion:No Immunizations administered:  There is no immunization history on file for this patient.  Physical exam  Vitals:   08/15/23 1000 08/15/23 1500 08/15/23 2000 08/16/23 0500  BP: 104/71 110/76 110/73 110/77  Pulse: 85 87 60   Resp: 16 18 16 18   Temp:  98.4 F (36.9 C) 98.3 F (36.8 C) 98 F (36.7 C)   TempSrc: Oral Oral Oral   SpO2: 100% 100% 100% 100%   General: alert, cooperative, and no distress Lochia: appropriate Uterine Fundus: firm Incision: N/A DVT Evaluation: No evidence of DVT seen on physical exam. No cords or calf tenderness. Labs: Lab Results  Component Value Date   WBC 16.1 (H) 08/15/2023   HGB 9.4 (L) 08/15/2023   HCT 30.1 (L) 08/15/2023   MCV 83.8 08/15/2023   PLT 231 08/15/2023      Latest Ref Rng & Units 07/16/2023    8:33 AM  CMP  Glucose 70 - 99 mg/dL 696   BUN 6 - 20 mg/dL 7   Creatinine 2.95 - 2.84 mg/dL 1.32   Sodium 440 - 102 mmol/L 135   Potassium 3.5 - 5.1 mmol/L 3.9   Chloride 98 - 111 mmol/L 104   CO2 22 - 32 mmol/L 24   Calcium 8.9 - 10.3 mg/dL 8.9   Total Protein 6.5 - 8.1 g/dL 6.6   Total Bilirubin <7.2 mg/dL 0.3   Alkaline Phos 38 - 126 U/L 105   AST 15 - 41 U/L 11   ALT 0 - 44 U/L 7    Edinburgh Score:    08/15/2023    6:01 PM  Edinburgh Postnatal Depression Scale Screening Tool  I have been able to laugh and see the funny side of things. 0  I have looked forward with enjoyment to things. 1  I have blamed myself unnecessarily when things  went wrong. 1  I have been anxious or worried for no good reason. 2  I have felt scared or panicky for no good reason. 1  Things have been getting on top of me. 1  I have been so unhappy that I have had difficulty sleeping. 0  I have felt sad or miserable. 0  I have been so unhappy that I have been crying. 1  The thought of harming myself has occurred to me. 0  Edinburgh Postnatal Depression Scale Total 7      After visit meds:  Allergies as of 08/16/2023       Reactions   Amoxicillin Nausea And Vomiting   Has patient had a PCN reaction causing immediate rash, facial/tongue/throat swelling, SOB or lightheadedness with hypotension: no Has patient had a PCN reaction causing severe rash involving mucus membranes or skin necrosis: no Has  patient had a PCN reaction that required hospitalization no Has patient had a PCN reaction occurring within the last 10 years: no If all of the above answers are "NO", then may proceed with Cephalosporin use.   Latex Itching   Other Reaction(s): irritate   Sumatriptan    C/o felt generalized flushing, burning, rhinorrhea within 2-3 min of medication Other Reaction(s): bad reactrion        Medication List     TAKE these medications    Colace 100 MG capsule Generic drug: docusate sodium Take by mouth 4 (four) times daily.   enoxaparin 80 MG/0.8ML injection Commonly known as: LOVENOX Inject 0.8 mLs (80 mg total) into the skin daily.   ibuprofen 600 MG tablet Commonly known as: ADVIL Take 1 tablet (600 mg total) by mouth every 6 (six) hours.   Metamucil 48.57 % Powd Generic drug: Psyllium 1 tablespoon Orally twice a day   PRENATAL GUMMIES PO Take by mouth daily.         Discharge home in stable condition Infant Feeding: Breast Infant Disposition:home with mother Discharge instruction: per After Visit Summary and Postpartum booklet. Activity: Advance as tolerated. Pelvic rest for 6 weeks.  Diet: routine diet Anticipated Birth Control: Unsure Postpartum Appointment:6 weeks Additional Postpartum F/U:  n/a Future Appointments: Future Appointments  Date Time Provider Department Center  09/25/2023  3:00 PM [facility] None  09/25/2023  3:15 PM Ennever, Rose Phi, MD [facility] None   Follow up Visit:      08/16/2023 Serita Kyle, MD

## 2023-08-16 NOTE — Progress Notes (Signed)
PPD2 SVD:   S:  Pt reports feeling well/ Tolerating po/ Voiding without problems/ No n/v/ Bleeding is light/ Pain controlled withprescription NSAID's including ibuprofen (Motrin)  Newborn info  live female BRF   O:  A & O x 3 / VS: Blood pressure 110/77, pulse 60, temperature 98 F (36.7 C), temperature source Oral, resp. rate 18, SpO2 100%, unknown if currently breastfeeding.  LABS: No results found for this or any previous visit (from the past 24 hour(s)).  I&O: I/O last 3 completed shifts: In: -  Out: 100 [Blood:100]   No intake/output data recorded.  Lungs: chest clear, no wheezing, rales, normal symmetric air entry  Heart: regular rate and rhythm, S1, S2 normal, no murmur, click, rub or gallop  Abdomen: uterus firm at umb  Perineum: not inspected  Lochia: light  Extremities:no redness or tenderness in the calves or thighs, no edema    A/P: PPD # 2/ [id number]  Doing well  Continue routine post partum orders  D/c instructions reviewed F/u 6 wk

## 2023-08-16 NOTE — Discharge Instructions (Signed)
Call if temperature greater than equal to 100.4, nothing per vagina for 4-6 weeks or severe nausea vomiting, increased incisional pain , drainage or redness in the incision site, no straining with bowel movements, showers no bath °

## 2023-08-16 NOTE — Lactation Note (Signed)
This note was copied from a baby's chart. Lactation Consultation Note  Patient Name: Girl Bethine Hermoso OVFIE'P Date: 08/16/2023 Age:34 hours Reason for consult: Follow-up assessment;Maternal discharge  P2, 38 wks, 36 hrs of life. Per mom baby latching very well, using both formula and breast. Highlighted differences between older baby and hands on teaching baby of newborn at breast. Discussed progression through cluster feeding overnights, encouraged hand expression and breast compression to keep infant working at breast. Discussed milk coming in expectations, regular softening/emptying of breast, highlighted prevention and treatment of engorgement with hand expression to soften breast and ice packs for 10 minutes to ease discomfort/inflammation of breast.  Encouraged nipple care post feed with EBM or coconut oil.   Maternal Data Does the patient have breastfeeding experience prior to this delivery?: Yes How long did the patient breastfeed?: 6 months  Feeding Mother's Current Feeding Choice: Breast Milk and Formula Nipple Type: Slow - flow   Interventions Interventions: Breast feeding basics reviewed;Breast compression;Hand express;Coconut oil;Education  Discharge Discharge Education: Engorgement and breast care Pump: DEBP;Personal (Spectra)  Consult Status Consult Status: Complete Date: 08/16/23    [facility]  Keimari Onida 08/16/2023, 11:45 AM

## 2023-08-18 ENCOUNTER — Inpatient Hospital Stay (HOSPITAL_COMMUNITY): Payer: No Typology Code available for payment source

## 2023-08-18 ENCOUNTER — Inpatient Hospital Stay (HOSPITAL_COMMUNITY)
Admission: RE | Admit: 2023-08-18 | Payer: No Typology Code available for payment source | Source: Home / Self Care | Admitting: Obstetrics and Gynecology

## 2023-08-19 ENCOUNTER — Ambulatory Visit: Payer: No Typology Code available for payment source

## 2023-08-24 ENCOUNTER — Telehealth (HOSPITAL_COMMUNITY): Payer: Self-pay

## 2023-08-24 NOTE — Telephone Encounter (Signed)
08/24/2023 1032  Name: Rebecca Benjamin MRN: [id number] DOB: 08/18/1991  Reason for Call:  Transition of Care Hospital Discharge Call  Contact Status: Patient Contact Status: Message  Language assistant needed:          Follow-Up Questions:    Inocente Salles Postnatal Depression Scale:  In the Past 7 Days:    PHQ2-9 Depression Scale:     Discharge Follow-up:    Post-discharge interventions: NA  Signature  Signe Colt

## 2023-09-25 ENCOUNTER — Inpatient Hospital Stay (HOSPITAL_BASED_OUTPATIENT_CLINIC_OR_DEPARTMENT_OTHER): Payer: No Typology Code available for payment source | Admitting: Hematology & Oncology

## 2023-09-25 ENCOUNTER — Encounter: Payer: Self-pay | Admitting: Hematology & Oncology

## 2023-09-25 ENCOUNTER — Inpatient Hospital Stay: Payer: No Typology Code available for payment source | Attending: Family

## 2023-09-25 VITALS — BP 125/83 | HR 59 | Temp 98.5°F | Resp 21 | Ht 66.0 in | Wt 162.8 lb

## 2023-09-25 DIAGNOSIS — O00219 Unspecified ovarian pregnancy with intrauterine pregnancy: Secondary | ICD-10-CM

## 2023-09-25 DIAGNOSIS — Z7901 Long term (current) use of anticoagulants: Secondary | ICD-10-CM | POA: Insufficient documentation

## 2023-09-25 DIAGNOSIS — E8802 Plasminogen deficiency: Secondary | ICD-10-CM | POA: Diagnosis present

## 2023-09-25 DIAGNOSIS — Z1589 Genetic susceptibility to other disease: Secondary | ICD-10-CM

## 2023-09-25 DIAGNOSIS — N96 Recurrent pregnancy loss: Secondary | ICD-10-CM | POA: Diagnosis not present

## 2023-09-25 LAB — CMP (CANCER CENTER ONLY)
ALT: 10 U/L (ref 0–44)
AST: 11 U/L — ABNORMAL LOW (ref 15–41)
Albumin: 4.1 g/dL (ref 3.5–5.0)
Alkaline Phosphatase: 64 U/L (ref 38–126)
Anion gap: 7 (ref 5–15)
BUN: 10 mg/dL (ref 6–20)
CO2: 28 mmol/L (ref 22–32)
Calcium: 9.3 mg/dL (ref 8.9–10.3)
Chloride: 104 mmol/L (ref 98–111)
Creatinine: 0.69 mg/dL (ref 0.44–1.00)
GFR, Estimated: >60 mL/min (ref >60)
Glucose, Bld: 88 mg/dL (ref 70–99)
Potassium: 3.9 mmol/L (ref 3.5–5.1)
Sodium: 139 mmol/L (ref 135–145)
Total Bilirubin: 0.4 mg/dL (ref 0.0–1.2)
Total Protein: 7 g/dL (ref 6.5–8.1)

## 2023-09-25 LAB — CBC WITH DIFFERENTIAL (CANCER CENTER ONLY)
Abs Immature Granulocytes: 0.02 10*3/uL (ref 0.00–0.07)
Basophils Absolute: 0 10*3/uL (ref 0.0–0.1)
Basophils Relative: 0 %
Eosinophils Absolute: 0 10*3/uL (ref 0.0–0.5)
Eosinophils Relative: 0 %
HCT: 38.4 % (ref 36.0–46.0)
Hemoglobin: 12.5 g/dL (ref 12.0–15.0)
Immature Granulocytes: 0 %
Lymphocytes Relative: 28 %
Lymphs Abs: 1.9 10*3/uL (ref 0.7–4.0)
MCH: 27.7 pg (ref 26.0–34.0)
MCHC: 32.6 g/dL (ref 30.0–36.0)
MCV: 85.1 fL (ref 80.0–100.0)
Monocytes Absolute: 0.3 10*3/uL (ref 0.1–1.0)
Monocytes Relative: 4 %
Neutro Abs: 4.6 10*3/uL (ref 1.7–7.7)
Neutrophils Relative %: 68 %
Platelet Count: 218 10*3/uL (ref 150–400)
RBC: 4.51 MIL/uL (ref 3.87–5.11)
RDW: 17.1 % — ABNORMAL HIGH (ref 11.5–15.5)
WBC Count: 6.8 10*3/uL (ref 4.0–10.5)
nRBC: 0 % (ref 0.0–0.2)

## 2023-09-25 NOTE — Progress Notes (Signed)
 Hematology and Oncology Follow Up Visit  Rebecca Benjamin [id number] 07-22-1991 33 y.o. 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  She is now post delivery.  She had a baby on December 4.  It was a nice baby girl.  She was 6 pounds and 9 ounces.  The delivery was incredibly quick.  It happened within 2 hours.  She Apsley had no problems with the Lovenox  throughout the pregnancy or post partum.  She was on Lovenox  for a month after her delivery.  Everything went well.  She is feeling good.  She is having no complaints.  There is been no cough or shortness of breath.  She has had no bleeding.  She has had no bruising.  She has had no nausea or vomiting.  She is feeding her baby with formula.  Currently, I would say that her performance status is ECOG 0.    Medications:  Current Outpatient Medications:    OVER THE COUNTER MEDICATION, Post natal vitamin daily., Disp: , Rfl:   Allergies:  Allergies  Allergen Reactions   Amoxicillin Nausea And Vomiting    Has patient had a PCN reaction causing immediate rash, facial/tongue/throat swelling, SOB or lightheadedness with hypotension: no  Has patient had a PCN reaction causing severe rash involving mucus membranes or skin necrosis: no  Has patient had a PCN reaction that required hospitalization no  Has patient had a PCN reaction occurring within the last 10 years: no  If all of the above answers are "NO", then may proceed with Cephalosporin use.   Latex Itching    Other Reaction(s): irritate   Sumatriptan      C/o felt generalized flushing, burning, rhinorrhea within 2-3 min of medication  Other Reaction(s): bad reactrion    Past Medical History, Surgical history, Social history, and Family History were reviewed and updated.  Review of Systems: Review of Systems  Constitutional:  Negative.   HENT:  Negative.    Eyes: Negative.   Respiratory: Negative.    Cardiovascular: Negative.   Gastrointestinal: Negative.   Endocrine: Negative.   Genitourinary: Negative.    Musculoskeletal: Negative.   Skin: Negative.   Neurological: Negative.   Hematological: Negative.   Psychiatric/Behavioral: Negative.      Physical Exam:  height is 5\' 6"  (1.676 m) and weight is 162 lb 12.8 oz (73.8 kg). Her oral temperature is 98.5 F (36.9 C). Her blood pressure is 125/83 and her pulse is 59 (abnormal). Her respiration is 21 (abnormal) and oxygen saturation is 100%.   Wt Readings from Last 3 Encounters:  09/25/23 162 lb 12.8 oz (73.8 kg)  07/16/23 179 lb (81.2 kg)  05/06/23 173 lb (78.5 kg)    Physical Exam Vitals reviewed.  HENT:     Head: Normocephalic and atraumatic.  Eyes:     Pupils: Pupils are equal, round, and reactive to light.  Cardiovascular:     Rate and Rhythm: Normal rate and regular rhythm.     Heart sounds: Normal heart sounds.  Pulmonary:     Effort: Pulmonary effort is normal.     Breath sounds: Normal breath sounds.  Abdominal:     General: Bowel sounds are normal.     Palpations: Abdomen is soft.     Comments: Her abdomen is soft.    She has no obvious fluid wave.  There is no palpable liver or spleen tip.  There is no guarding or rebound tenderness.  Musculoskeletal:        General: No tenderness or deformity. Normal range of motion.     Cervical back: Normal range of motion.     Comments: Extremities shows no clubbing, cyanosis or edema.  Lymphadenopathy:     Cervical: No cervical adenopathy.  Skin:    General: Skin is warm and dry.     Findings: No erythema or rash.  Neurological:     Mental Status: She is alert and oriented to person, place, and time.  Psychiatric:        Behavior: Behavior normal.        Thought Content: Thought content normal.        Judgment: Judgment normal.      Lab Results  Component Value Date   WBC 6.8  09/25/2023   HGB 12.5 09/25/2023   HCT 38.4 09/25/2023   MCV 85.1 09/25/2023   PLT 218 09/25/2023     Chemistry      Component Value Date/Time   NA 139 09/25/2023 1508   K 3.9 09/25/2023 1508   CL 104 09/25/2023 1508   CO2 28 09/25/2023 1508   BUN 10 09/25/2023 1508   CREATININE 0.69 09/25/2023 1508      Component Value Date/Time   CALCIUM 9.3 09/25/2023 1508   ALKPHOS 64 09/25/2023 1508   AST 11 (L) 09/25/2023 1508   ALT 10 09/25/2023 1508   BILITOT 0.4 09/25/2023 1508      Impression and Plan: Rebecca Benjamin is a very nice 33 year old African-American female.  She comes in with her husband and 67-year-old son.  Both she and her husband were in the [employer].  As such, they are true [employer].    I am so happy that everything went well with the delivery.  She never had a problem during her pregnancy and postpartum.  This point I am going to, we do not have to see her back.  If she does have another pregnancy, we will certainly see her back then.  I am do so happy for her.  Her baby is beautiful.  Rebecca Benjamin is incredibly tough.  She got through her pregnancy.  I am so happy that everything turned out well.    Her faith has been incredibly strong throughout her pregnancy and postpartum.     Ivor Mars, MD 1/15/20255:05 PM

## 2024-02-27 ENCOUNTER — Encounter (HOSPITAL_BASED_OUTPATIENT_CLINIC_OR_DEPARTMENT_OTHER): Payer: Self-pay | Admitting: Emergency Medicine

## 2024-02-27 ENCOUNTER — Emergency Department (HOSPITAL_BASED_OUTPATIENT_CLINIC_OR_DEPARTMENT_OTHER): Payer: Self-pay

## 2024-02-27 ENCOUNTER — Other Ambulatory Visit: Payer: Self-pay

## 2024-02-27 ENCOUNTER — Emergency Department (HOSPITAL_BASED_OUTPATIENT_CLINIC_OR_DEPARTMENT_OTHER)
Admission: EM | Admit: 2024-02-27 | Discharge: 2024-02-27 | Disposition: A | Discharge status: Home / Self Care | Payer: Self-pay | Attending: Emergency Medicine | Admitting: Emergency Medicine

## 2024-02-27 DIAGNOSIS — Z91040 Latex allergy status: Secondary | ICD-10-CM | POA: Insufficient documentation

## 2024-02-27 DIAGNOSIS — R0789 Other chest pain: Secondary | ICD-10-CM | POA: Insufficient documentation

## 2024-02-27 DIAGNOSIS — R002 Palpitations: Secondary | ICD-10-CM | POA: Insufficient documentation

## 2024-02-27 LAB — CBC
HCT: 38.2 % (ref 36.0–46.0)
Hemoglobin: 12.7 g/dL (ref 12.0–15.0)
MCH: 29.6 pg (ref 26.0–34.0)
MCHC: 33.2 g/dL (ref 30.0–36.0)
MCV: 89 fL (ref 80.0–100.0)
Platelets: 258 10*3/uL (ref 150–400)
RBC: 4.29 MIL/uL (ref 3.87–5.11)
RDW: 13.3 % (ref 11.5–15.5)
WBC: 7.9 10*3/uL (ref 4.0–10.5)
nRBC: 0 % (ref 0.0–0.2)

## 2024-02-27 LAB — BASIC METABOLIC PANEL WITH GFR
Anion gap: 10 (ref 5–15)
BUN: 14 mg/dL (ref 6–20)
CO2: 26 mmol/L (ref 22–32)
Calcium: 9.6 mg/dL (ref 8.9–10.3)
Chloride: 104 mmol/L (ref 98–111)
Creatinine, Ser: 0.77 mg/dL (ref 0.44–1.00)
GFR, Estimated: >60 mL/min (ref >60)
Glucose, Bld: 97 mg/dL (ref 70–99)
Potassium: 4.1 mmol/L (ref 3.5–5.1)
Sodium: 140 mmol/L (ref 135–145)

## 2024-02-27 LAB — HCG, SERUM, QUALITATIVE: Preg, Serum: NEGATIVE

## 2024-02-27 LAB — TROPONIN T, HIGH SENSITIVITY: Troponin T High Sensitivity: <15 ng/L (ref <19)

## 2024-02-27 LAB — MAGNESIUM: Magnesium: 1.9 mg/dL (ref 1.7–2.4)

## 2024-02-27 NOTE — ED Notes (Signed)
 Pt here to be evaluated for CP and palpitations Describes her pain as tightness.  Pt states she has a cardiologist and has worn a monitor in the past.  Pt recently had a baby 6 months ago

## 2024-02-27 NOTE — Discharge Instructions (Signed)
Follow-up with your cardiologist in the office  Try pepcid or tagamet up to twice a day.  Try to avoid things that may make this worse, most commonly these are spicy foods tomato based products fatty foods chocolate and peppermint.  Alcohol and tobacco can also make this worse.  Return to the emergency department for sudden worsening pain fever or inability to eat or drink.

## 2024-02-27 NOTE — ED Provider Notes (Addendum)
 [place] [facility] Provider Note   CSN: [id number] Arrival date & time: 02/27/24  0137     Patient presents with: Chest Pain and Palpitations   Rebecca Benjamin is a 33 y.o. female.   33 yo F with a chief complaint of palpitations.  She says these have been occurring since she was pregnant.  Having symptoms for about 9 months now in total.  She feels like they are getting worse.  Come and go seemingly at random.  Lasts for maybe a couple seconds and then resolves.  Has had it on occasion las up to a few minutes.  Nothing she seems to do seems to make it better or worse.  She has seen a cardiologist for this and is in the middle of the workup.  She had a Zio patch placed but unfortunately had allergic to the adhesive and so only got about 48 hours of data.  She is still awaiting a outpatient echocardiogram.   Chest Pain Associated symptoms: palpitations   Palpitations Associated symptoms: chest pain        Prior to Admission medications   Medication Sig Start Date End Date Taking? Authorizing Provider  OVER THE COUNTER MEDICATION Post natal vitamin daily.    [provider]  iron  polysaccharides (NIFEREX) 150 MG capsule Take 1 capsule (150 mg total) by mouth daily. Patient not taking: Reported on 07/12/2018 01/27/18 09/09/20  Paul, Daniela C, CNM    Allergies: Amoxicillin, Latex, and Sumatriptan     Review of Systems  Cardiovascular:  Positive for chest pain and palpitations.    Updated Vital Signs BP (!) 124/90   Pulse 80   Resp 19   SpO2 100%   Physical Exam Vitals and nursing note reviewed.  Constitutional:      General: She is not in acute distress.    Appearance: She is well-developed. She is not diaphoretic.  HENT:     Head: Normocephalic and atraumatic.   Eyes:     Pupils: Pupils are equal, round, and reactive to light.    Cardiovascular:     Rate and Rhythm: Normal rate and regular rhythm.     Heart sounds: No  murmur heard.    No friction rub. No gallop.  Pulmonary:     Effort: Pulmonary effort is normal.     Breath sounds: No wheezing or rales.  Abdominal:     General: There is no distension.     Palpations: Abdomen is soft.     Tenderness: There is no abdominal tenderness.   Musculoskeletal:        General: No tenderness.     Cervical back: Normal range of motion and neck supple.   Skin:    General: Skin is warm and dry.   Neurological:     Mental Status: She is alert and oriented to person, place, and time.   Psychiatric:        Behavior: Behavior normal.     (all labs ordered are listed, but only abnormal results are displayed) Labs Reviewed  BASIC METABOLIC PANEL WITH GFR  CBC  MAGNESIUM   HCG, SERUM, QUALITATIVE  TROPONIN T, HIGH SENSITIVITY    EKG: None  Radiology: DG Chest 2 View Result Date: 02/27/2024 CLINICAL DATA:  Worsening chest tightness and palpitations x6 months. EXAM: CHEST - 2 VIEW COMPARISON:  September 13, 2020 FINDINGS: The heart size and mediastinal contours are within normal limits. Both lungs are clear. The visualized skeletal structures are unremarkable.  IMPRESSION: No active cardiopulmonary disease. Electronically Signed   By: Virgle Grime M.D.   On: 02/27/2024 02:20     .1-3 Lead EKG Interpretation  Performed by: Albertus Hughs, DO Authorized by: Albertus Hughs, DO     Interpretation: normal     ECG rate:  85   ECG rate assessment: normal     Rhythm: sinus rhythm     Ectopy: none     Conduction: normal      Medications Ordered in the ED - No data to display                                  Medical Decision Making Amount and/or Complexity of Data Reviewed Labs: ordered. Radiology: ordered.   33 yo F with a chief complaint of palpitations.  Off and on for about 9 months now.  Feels like they are getting worse in severity and occurring more often.  She went to a cardiology clinic and had a Zio patch placed.  She also is awaiting an  outpatient echocardiogram.  Will check electrolytes here.  Observe on the monitor.  Reassess.  Lab work here is reassuring.  No significant electrolyte abnormalities.  No acute anemia.  Chest x-ray independently interpreted by me without focal extremity pneumothorax.  I discussed results with the patient.  She tells me that she is having ongoing symptoms.  I see no obvious ectopy on the monitor.  I discussed other possible causes with her including a trial of medication for possible reflux disease.  I did encourage her to follow-up with her cardiologist.  3:00 AM:  I have discussed the diagnosis/risks/treatment options with the patient.  Evaluation and diagnostic testing in the emergency department does not suggest an emergent condition requiring admission or immediate intervention beyond what has been performed at this time.  They will follow up with PCP, Cards. We also discussed returning to the ED immediately if new or worsening sx occur. We discussed the sx which are most concerning (e.g., sudden worsening pain, fever, inability to tolerate by mouth) that necessitate immediate return. Medications administered to the patient during their visit and any new prescriptions provided to the patient are listed below.  Medications given during this visit Medications - No data to display   The patient appears reasonably screen and/or stabilized for discharge and I doubt any other medical condition or other [facility] requiring further screening, evaluation, or treatment in the ED at this time prior to discharge.       Final diagnoses:  Palpitations    ED Discharge Orders     None          Albertus Hughs, [place] 02/27/24 1610

## 2024-02-27 NOTE — ED Triage Notes (Signed)
 Pt reports chest tightness & palpitations. Has been happening on and off for a few weeks. Has been seeing a cardiologist.  6 months postpartum

## 2024-02-27 NOTE — ED Notes (Signed)
 Went in to [employer] pt and she is upset and states she wants this RN to notate the chart as to the EDP did not discuss her lab results and is dismissing her CP and palpitations as indigestion.  She then requests I print off proof that note was placed in chart.  Informed pt that this RN is not allowed to print off any medical records.   Informed EDP & charge RN of all the above
